# Patient Record
Sex: Male | Born: 1990 | Race: Black or African American | Hispanic: No | Marital: Single | Smoking: Current some day smoker
Health system: Southern US, Community
[De-identification: ages and names within clinical notes are randomized; demographics above are authoritative.]

## PROBLEM LIST (undated history)

## (undated) DIAGNOSIS — F419 Anxiety disorder, unspecified: Secondary | ICD-10-CM

## (undated) DIAGNOSIS — I517 Cardiomegaly: Secondary | ICD-10-CM

## (undated) DIAGNOSIS — I639 Cerebral infarction, unspecified: Secondary | ICD-10-CM

## (undated) DIAGNOSIS — I480 Paroxysmal atrial fibrillation: Secondary | ICD-10-CM

## (undated) DIAGNOSIS — I7774 Dissection of vertebral artery: Secondary | ICD-10-CM

## (undated) HISTORY — DX: Dissection of vertebral artery: I77.74

## (undated) HISTORY — PX: ADENOIDECTOMY: SUR15

## (undated) HISTORY — DX: Cerebral infarction, unspecified: I63.9

## (undated) HISTORY — DX: Paroxysmal atrial fibrillation: I48.0

## (undated) HISTORY — DX: Cardiomegaly: I51.7

---
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
## 2011-01-14 ENCOUNTER — Encounter: Payer: Self-pay | Admitting: *Deleted

## 2011-01-14 DIAGNOSIS — J40 Bronchitis, not specified as acute or chronic: Secondary | ICD-10-CM | POA: Insufficient documentation

## 2011-01-14 NOTE — ED Notes (Signed)
Patient c/o left chest pain, denies cough, pain occurred while at work-lefts boxes

## 2011-01-15 ENCOUNTER — Emergency Department (HOSPITAL_COMMUNITY)
Admission: EM | Admit: 2011-01-15 | Discharge: 2011-01-15 | Disposition: A | Discharge status: Home / Self Care | Payer: Self-pay | Attending: Emergency Medicine | Admitting: Emergency Medicine

## 2011-01-15 ENCOUNTER — Emergency Department (HOSPITAL_COMMUNITY): Payer: Self-pay

## 2011-01-15 DIAGNOSIS — J40 Bronchitis, not specified as acute or chronic: Secondary | ICD-10-CM

## 2011-01-15 HISTORY — DX: Anxiety disorder, unspecified: F41.9

## 2011-01-15 MED ORDER — ALBUTEROL SULFATE HFA 108 (90 BASE) MCG/ACT IN AERS
2.0000 | INHALATION_SPRAY | Freq: Once | RESPIRATORY_TRACT | Status: AC
  Administered 2011-01-15: 2 via RESPIRATORY_TRACT
  Filled 2011-01-15: qty 6.7

## 2011-01-15 NOTE — ED Provider Notes (Signed)
History     Chief Complaint  Patient presents with  . Chest Pain   Patient is a 20 y.o. male presenting with chest pain. The history is provided by the patient.  Chest Pain The chest pain began 6 - 12 hours ago. Chest pain occurs constantly. The chest pain is unchanged. Associated with: no provocation. The severity of the pain is mild. The quality of the pain is described as tightness. The pain does not radiate. Exacerbated by: nothing. Pertinent negatives for primary symptoms include no fever, no syncope, no shortness of breath, no cough, no wheezing, no nausea and no vomiting.  Pertinent negatives for associated symptoms include no claudication, no diaphoresis, no near-syncope, no numbness and no weakness. He tried nothing for the symptoms. Risk factors include no known risk factors.  His past medical history is significant for anxiety/panic attacks.     Past Medical History  Diagnosis Date  . Anxiety     History reviewed. No pertinent past surgical history.  History reviewed. No pertinent family history.  History  Substance Use Topics  . Smoking status: Current Everyday Smoker -- 0.5 packs/day    Types: Cigarettes  . Smokeless tobacco: Not on file  . Alcohol Use: No      Review of Systems  Constitutional: Negative for fever and diaphoresis.  Respiratory: Negative for cough, shortness of breath and wheezing.   Cardiovascular: Positive for chest pain. Negative for claudication, syncope and near-syncope.  Gastrointestinal: Negative for nausea and vomiting.  Neurological: Negative for weakness and numbness.  All other systems reviewed and are negative.    Physical Exam  BP 144/79  Pulse 94  Temp(Src) 99.2 F (37.3 C) (Oral)  Resp 18  Ht 6' (1.829 m)  Wt 184 lb (83.462 kg)  BMI 24.95 kg/m2  SpO2 100%  Physical Exam  Constitutional: He is oriented to person, place, and time. He appears well-developed and well-nourished.  HENT:  Head: Normocephalic.  Right Ear:  External ear normal.  Left Ear: External ear normal.  Eyes: Pupils are equal, round, and reactive to light.  Neck: Normal range of motion.  Cardiovascular: Normal rate.   Pulmonary/Chest: No respiratory distress. He has no wheezes. He has no rales. He exhibits no tenderness.  Abdominal: He exhibits no distension. There is no tenderness.  Musculoskeletal: He exhibits no edema and no tenderness.  Neurological: He is alert and oriented to person, place, and time. He has normal reflexes.  Skin: Skin is warm and dry.  Psychiatric: He has a normal mood and affect. His behavior is normal. Judgment and thought content normal.    ED Course  Procedures Reevaluation with update and discussion. After initial assessment and treatment, an updated evaluation at this time reveals; pt has decreased chest discomfort after the inhaler. I doubt any other EMC precluding discharge at this time including, but not necessarily limited to the following: PNE,PE, PTX, serious bacterial infection MDM As above      Flint Melter, MD 01/15/11 [phone]

## 2011-01-15 NOTE — ED Notes (Signed)
Pt gone over to xray  

## 2011-01-16 ENCOUNTER — Emergency Department (HOSPITAL_COMMUNITY)
Admission: EM | Admit: 2011-01-16 | Discharge: 2011-01-16 | Disposition: A | Discharge status: Home / Self Care | Payer: Self-pay | Attending: Emergency Medicine | Admitting: Emergency Medicine

## 2011-01-16 ENCOUNTER — Other Ambulatory Visit: Payer: Self-pay

## 2011-01-16 ENCOUNTER — Emergency Department (HOSPITAL_COMMUNITY): Payer: Self-pay

## 2011-01-16 ENCOUNTER — Encounter (HOSPITAL_COMMUNITY): Payer: Self-pay | Admitting: *Deleted

## 2011-01-16 DIAGNOSIS — F17200 Nicotine dependence, unspecified, uncomplicated: Secondary | ICD-10-CM | POA: Insufficient documentation

## 2011-01-16 DIAGNOSIS — R071 Chest pain on breathing: Secondary | ICD-10-CM | POA: Insufficient documentation

## 2011-01-16 MED ORDER — IBUPROFEN 800 MG PO TABS: 800.0000 mg | ORAL_TABLET | Freq: Three times a day (TID) | ORAL | Status: AC

## 2011-01-16 MED ORDER — IBUPROFEN 800 MG PO TABS
800.0000 mg | ORAL_TABLET | Freq: Once | ORAL | Status: AC
  Administered 2011-01-16: 800 mg via ORAL
  Filled 2011-01-16: qty 1

## 2011-01-16 MED ORDER — LORAZEPAM 1 MG PO TABS
1.0000 mg | ORAL_TABLET | Freq: Once | ORAL | Status: AC
  Administered 2011-01-16: 1 mg via ORAL
  Filled 2011-01-16: qty 1

## 2011-01-16 NOTE — ED Notes (Signed)
Pt calm, cooperative, A&O, NAD; verbalizes understanding of POC and f/u plan

## 2011-01-16 NOTE — ED Notes (Signed)
Pt c/o left chest pain and pressure with shortness of breath. States that he was seen in ED 2 days ago but the pain is worse now. Denies cough and congestion.

## 2011-01-16 NOTE — ED Notes (Signed)
Pt states dx with bronchitis when here for CP two days ago. Pt states he was getting ready to start work and began having pain to left chest. Pain described as pressure. EKG obtained. NSR on monitor. NAD. Pain is non radiating.

## 2011-01-16 NOTE — ED Provider Notes (Signed)
History     Chief Complaint  Patient presents with  . Chest Pain   HPI Comments: Pt reports episode of chest pressure 2 days ago, seen in ED at that time. Another episode occurred today while working in the heat pta, +associated mild sob. Pt states sob has resolved but still c/o chest pressure and feeling anxious. No recent injury. Reports pain is located in the left chest, non-radiating, and worse with certain positions and heat exposure.  Patient is a 20 y.o. male presenting with chest pain. The history is provided by the patient.  Chest Pain The chest pain began 2 days ago. Chest pain occurs intermittently. Progression since onset: Episode of chest pressure 2 days ago, seen in ED at that time; pt at work today in the heat and c/o another episode of same pressure with sob onset just pta. The pain is associated with exertion (heat). The quality of the pain is described as pressure-like. The pain does not radiate. Chest pain is worsened by certain positions (being in the heat). Primary symptoms include shortness of breath. Pertinent negatives for primary symptoms include no fever, no cough, no palpitations, no abdominal pain, no nausea and no dizziness.  Pertinent negatives for associated symptoms include no lower extremity edema, no numbness and no weakness. Associated symptoms comments: Feeling anxious.  His past medical history is significant for anxiety/panic attacks.     Past Medical History  Diagnosis Date  . Anxiety     Past Surgical History  Procedure Date  . Adenoidectomy     History reviewed. No pertinent family history.  History  Substance Use Topics  . Smoking status: Current Everyday Smoker -- 0.5 packs/day    Types: Cigarettes  . Smokeless tobacco: Not on file  . Alcohol Use: No      Review of Systems  Constitutional: Negative for fever and chills.  HENT: Negative for neck pain and neck stiffness.   Eyes: Negative for pain.  Respiratory: Positive for shortness  of breath. Negative for cough.   Cardiovascular: Positive for chest pain. Negative for palpitations and leg swelling.  Gastrointestinal: Negative for nausea and abdominal pain.  Genitourinary: Negative for dysuria.  Musculoskeletal: Negative for back pain.  Skin: Negative for rash.  Neurological: Negative for dizziness, weakness, numbness and headaches.  Psychiatric/Behavioral: The patient is nervous/anxious.   All other systems reviewed and are negative.    Physical Exam  BP 112/71  Pulse 94  Temp(Src) 99.1 F (37.3 C) (Oral)  Resp 20  Ht 6' (1.829 m)  Wt 184 lb (83.462 kg)  BMI 24.95 kg/m2  SpO2 99%  Physical Exam  Constitutional: He is oriented to person, place, and time. He appears well-developed and well-nourished.  HENT:  Head: Normocephalic and atraumatic.  Eyes: Conjunctivae and EOM are normal. Pupils are equal, round, and reactive to light.  Neck: Trachea normal. Neck supple. No thyromegaly present.  Cardiovascular: Normal rate, regular rhythm, S1 normal, S2 normal and normal pulses.     No systolic murmur is present   No diastolic murmur is present  Pulses:      Radial pulses are 2+ on the right side, and 2+ on the left side.  Pulmonary/Chest: Effort normal and breath sounds normal. He has no wheezes. He has no rhonchi. He has no rales. He exhibits tenderness.       Reproducible left chest wall tenderness  Abdominal: Soft. Normal appearance and bowel sounds are normal. There is no tenderness. There is no CVA tenderness and negative  Murphy's sign.  Musculoskeletal: He exhibits no edema and no tenderness.       BLE:s Calves nontender, no cords or erythema, negative Homans sign  Neurological: He is alert and oriented to person, place, and time. He has normal strength. No cranial nerve deficit or sensory deficit. GCS eye subscore is 4. GCS verbal subscore is 5. GCS motor subscore is 6.  Skin: Skin is warm and dry. No rash noted. He is not diaphoretic.  Psychiatric: His  speech is normal.       Cooperative and appropriate    ED Course  Procedures   Date: 01/16/2011  Rate: 93  Rhythm: normal sinus rhythm  QRS Axis: normal  Intervals: normal  ST/T Wave abnormalities: nonspecific ST changes  Conduction Disutrbances:none  Narrative Interpretation:   Old EKG Reviewed: unchanged  Dg Chest 1 View  01/15/2011  *RADIOLOGY REPORT*  Clinical Data: 20 year old male with chest tightness, pain.  CHEST - 1 VIEW  Comparison: 0230 hours the same day and earlier.  Findings: A single lateral view the chest is provided for evaluation of question small pleural effusions seen on the previous upright portable.  The costophrenic sulci appear clear.  Cardiac and mediastinal contours appear stable compared to the 2010 exam. No acute osseous abnormality identified.  No acute pulmonary opacity.  IMPRESSION: No evidence of pleural effusions. No acute cardiopulmonary abnormality.  Original Report Authenticated By: Harley Hallmark, M.D.   Dg Chest Portable 1 View  01/15/2011  *RADIOLOGY REPORT*  Clinical Data: 20 year old male with chest pain.  PORTABLE CHEST - 1 VIEW  Comparison: 05/01/2009 and earlier.  Findings: Portable upright AP view 0230 hours.  Stable mild cardiomegaly. Other mediastinal contours are within normal limits. Visualized tracheal air column is within normal limits.  Suggestion of small bilateral pleural effusions.  Otherwise, the lungs are clear. No acute osseous abnormality identified.  IMPRESSION: Suggestion of small bilateral pleural effusions, could be confirmed with a lateral view.  Stable mild cardiomegaly.  Original Report Authenticated By: Harley Hallmark, M.D.   Dg Chest 2 View  01/16/2011  *RADIOLOGY REPORT*  Clinical Data: Chest pain  CHEST - 2 VIEW  Comparison: 01/15/11  Findings: The heart size and mediastinal contours are within normal limits.  Both lungs are clear.  The visualized skeletal structures are unremarkable.  IMPRESSION:  No active cardiopulmonary  abnormalities.  Original Report Authenticated By: Rosealee Albee, M.D.    Pt recheck 1850: Pt resting comfortably, pain improved. Discussed workup findings and plan for discharge, pt understands and agrees with plan.   MDM  PT with reproducible chest wall tenderness and h/o anxiety, screening ECG reviewed and CXR obtained.  Treated PO NSAIDs and ativan for anxiety. No PTX and no symptoms of DVT, doubt PE given exam.   I personally performed the services described in this documentation, which was scribed in my presence. The recorded information has been reviewed and considered.  Written by Enos Fling acting as scribe for Dr. Dierdre Highman.       Sunnie Nielsen, MD 01/17/11 [phone]

## 2011-09-03 ENCOUNTER — Emergency Department (INDEPENDENT_AMBULATORY_CARE_PROVIDER_SITE_OTHER)
Admission: EM | Admit: 2011-09-03 | Discharge: 2011-09-03 | Disposition: A | Discharge status: Home / Self Care | Payer: BC Managed Care – PPO | Source: Home / Self Care | Attending: Family Medicine | Admitting: Family Medicine

## 2011-09-03 ENCOUNTER — Encounter (HOSPITAL_COMMUNITY): Payer: Self-pay | Admitting: Emergency Medicine

## 2011-09-03 DIAGNOSIS — J029 Acute pharyngitis, unspecified: Secondary | ICD-10-CM

## 2011-09-03 LAB — POCT RAPID STREP A: Streptococcus, Group A Screen (Direct): NEGATIVE

## 2011-09-03 NOTE — ED Notes (Signed)
Pt. Stated, I feel like my tonsils are swollen, they don't hurt, and I'm not sick.

## 2011-09-03 NOTE — ED Notes (Signed)
Pt. Walked out of the room, and his girlfriend stated, I don't know what's wrong with him, I'm trying to get him back,  Pt. Never returned.

## 2011-09-03 NOTE — ED Notes (Signed)
Pt walked out of exam room toward waiting room and didn't say anything to staff in hallway.  A few minutes later friend that was in the room walked out of room too.  We asked her if he was leaving and she said "I don't know, I am going to see what he is doing".  Friend apologized.  Went out to waiting room and neither pt nor friend are seen in waiting room.

## 2011-09-15 ENCOUNTER — Emergency Department (HOSPITAL_COMMUNITY)
Admission: EM | Admit: 2011-09-15 | Discharge: 2011-09-15 | Discharge status: Against Medical Advice | Payer: BC Managed Care – PPO | Attending: Emergency Medicine | Admitting: Emergency Medicine

## 2011-09-15 ENCOUNTER — Encounter (HOSPITAL_COMMUNITY): Payer: Self-pay | Admitting: *Deleted

## 2011-09-15 DIAGNOSIS — Z0389 Encounter for observation for other suspected diseases and conditions ruled out: Secondary | ICD-10-CM | POA: Insufficient documentation

## 2011-09-15 NOTE — ED Notes (Signed)
Call to waiting area with no reply 

## 2011-09-15 NOTE — ED Notes (Signed)
Pt reports having increased anxiety over the past couple of weeks. It wakes him from his sleep. Pt denies SI or HI.

## 2011-09-15 NOTE — ED Notes (Signed)
3 rd call to waiting area with no reply suspected elopement

## 2011-09-16 ENCOUNTER — Encounter (HOSPITAL_COMMUNITY): Payer: Self-pay

## 2011-09-16 DIAGNOSIS — F411 Generalized anxiety disorder: Secondary | ICD-10-CM | POA: Insufficient documentation

## 2011-09-17 ENCOUNTER — Emergency Department (HOSPITAL_COMMUNITY)
Admission: EM | Admit: 2011-09-17 | Discharge: 2011-09-17 | Disposition: A | Discharge status: Home / Self Care | Payer: BC Managed Care – PPO | Attending: Emergency Medicine | Admitting: Emergency Medicine

## 2011-09-17 DIAGNOSIS — F419 Anxiety disorder, unspecified: Secondary | ICD-10-CM

## 2011-09-17 MED ORDER — ALPRAZOLAM 0.5 MG PO TABS
0.2500 mg | ORAL_TABLET | Freq: Once | ORAL | Status: AC
  Administered 2011-09-17: 0.25 mg via ORAL
  Filled 2011-09-17: qty 1

## 2011-09-17 NOTE — ED Notes (Signed)
Pt alert & oriented x4, stable gait. Pt given discharge instructions, paperwork. Patient instructed to stop at the registration desk to finish any additional paperwork. pt verbalized understanding. Pt left department w/ no further questions.  

## 2011-09-17 NOTE — ED Provider Notes (Signed)
History     CSN: [id number]  Arrival date & time 09/16/11  2322   First MD Initiated Contact with Patient 09/17/11 0136      Chief Complaint  Patient presents with  . Anxiety    (Consider location/radiation/quality/duration/timing/severity/associated sxs/prior treatment) HPI David Shields is a 21 y.o. male who presents to the Emergency Department complaining of anxiety. He states his has anxiety which has gotten worse over the past two weeks. He is to see a new PCP on Thursday. Formerly seen by Dr. Janna Arch who would not Rx anti anxiety medicines. Patient states his anxiety manifests as rapid heart rate, rapid breathing, dizziness, perioral numbness and tingling. He denies SI, HI, hallucinations. Past Medical History  Diagnosis Date  . Anxiety     Past Surgical History  Procedure Date  . Adenoidectomy     No family history on file.  History  Substance Use Topics  . Smoking status: Current Everyday Smoker -- 0.5 packs/day    Types: Cigarettes  . Smokeless tobacco: Not on file  . Alcohol Use: No      Review of Systems  Constitutional: Negative for fever.       10 Systems reviewed and are negative for acute change except as noted in the HPI.  HENT: Negative for congestion.   Eyes: Negative for discharge and redness.  Respiratory: Negative for cough and shortness of breath.   Cardiovascular: Negative for chest pain.  Gastrointestinal: Negative for vomiting and abdominal pain.  Musculoskeletal: Negative for back pain.  Skin: Negative for rash.  Neurological: Negative for syncope, numbness and headaches.  Psychiatric/Behavioral:       Anxious    Allergies  Review of patient's allergies indicates no known allergies.  Home Medications   Current Outpatient Rx  Name Route Sig Dispense Refill  . VENTOLIN IN Inhalation Inhale 2 puffs into the lungs 2 (two) times daily.        BP 124/79  Pulse 100  Temp(Src) 98.3 F (36.8 C) (Oral)  Resp 20  Ht 6' (1.829 m)   Wt 157 lb (71.215 kg)  BMI 21.29 kg/m2  SpO2 100%  Physical Exam  Nursing note and vitals reviewed. Constitutional: He is oriented to person, place, and time.       Awake, alert, nontoxic appearance.  HENT:  Head: Atraumatic.  Eyes: Right eye exhibits no discharge. Left eye exhibits no discharge.  Neck: Neck supple.  Pulmonary/Chest: Effort normal. He exhibits no tenderness.  Abdominal: Soft. There is no tenderness. There is no rebound.  Musculoskeletal: He exhibits no tenderness.       Baseline ROM, no obvious new focal weakness.  Neurological: He is alert and oriented to person, place, and time.        motor strength appears baseline for patient and situation.  Skin: No rash noted.  Psychiatric:       anxious    ED Course  Procedures (including critical care time 1. Anxiety       MDM  Patient with anxiety here with anxiety. Given anxiolytic. Encouraged to keep appointment with new PCP on Thursday. Given information re [facility]. Patient is not suicidal, homicidal or psychotic.Pt stable in ED with no significant deterioration in condition.The patient appears reasonably screened and/or stabilized for discharge and I doubt any other medical condition or other [facility] requiring further screening, evaluation, or treatment in the ED at this time prior to discharge.  MDM Reviewed: nursing note and vitals  Nicoletta Dress. Colon Branch, MD 09/17/11 1014

## 2011-09-17 NOTE — ED Notes (Signed)
Pt to pcp provider on Thursday. [facility] was seen [facility] ago & left AMA. Pt denies taking any meds for anxiety.

## 2011-09-17 NOTE — Discharge Instructions (Signed)
Keep your appointment with your new doctor on Thursday. Talked with him about your anxiety. He may decide to start you on medication.    Anxiety and Panic Attacks Your caregiver has informed you that you are having an anxiety or panic attack. There may be many forms of this. Most of the time these attacks come suddenly and without warning. They come at any time of day, including periods of sleep, and at any time of life. They may be strong and unexplained. Although panic attacks are very scary, they are physically harmless. Sometimes the cause of your anxiety is not known. Anxiety is a protective mechanism of the body in its fight or flight mechanism. Most of these perceived danger situations are actually nonphysical situations (such as anxiety over losing a job). CAUSES  The causes of an anxiety or panic attack are many. Panic attacks may occur in otherwise healthy people given a certain set of circumstances. There may be a genetic cause for panic attacks. Some medications may also have anxiety as a side effect. SYMPTOMS  Some of the most common feelings are:  Intense terror.   Dizziness, feeling faint.   Hot and cold flashes.   Fear of going crazy.   Feelings that nothing is real.   Sweating.   Shaking.   Chest pain or a fast heartbeat (palpitations).   Smothering, choking sensations.   Feelings of impending doom and that death is near.   Tingling of extremities, this may be from over-breathing.   Altered reality (derealization).   Being detached from yourself (depersonalization).  Several symptoms can be present to make up anxiety or panic attacks. DIAGNOSIS  The evaluation by your caregiver will depend on the type of symptoms you are experiencing. The diagnosis of anxiety or panic attack is made when no physical illness can be determined to be a cause of the symptoms. TREATMENT  Treatment to prevent anxiety and panic attacks may include:  Avoidance of circumstances that  cause anxiety.   Reassurance and relaxation.   Regular exercise.   Relaxation therapies, such as yoga.   Psychotherapy with a psychiatrist or therapist.   Avoidance of caffeine, alcohol and illegal drugs.   Prescribed medication.  SEEK IMMEDIATE MEDICAL CARE IF:   You experience panic attack symptoms that are different than your usual symptoms.   You have any worsening or concerning symptoms.  Document Released: 06/17/2005 Document Revised: 06/06/2011 Document Reviewed: 10/19/2009 [facility] Patient Information 2012 [place], [place].

## 2011-09-19 ENCOUNTER — Encounter: Payer: Self-pay | Admitting: Family Medicine

## 2011-09-19 ENCOUNTER — Ambulatory Visit (INDEPENDENT_AMBULATORY_CARE_PROVIDER_SITE_OTHER): Payer: BC Managed Care – PPO | Admitting: Family Medicine

## 2011-09-19 VITALS — BP 104/78 | HR 74 | Resp 16 | Ht 72.25 in | Wt 154.4 lb

## 2011-09-19 DIAGNOSIS — F410 Panic disorder [episodic paroxysmal anxiety] without agoraphobia: Secondary | ICD-10-CM

## 2011-09-19 DIAGNOSIS — F418 Other specified anxiety disorders: Secondary | ICD-10-CM | POA: Insufficient documentation

## 2011-09-19 DIAGNOSIS — F17200 Nicotine dependence, unspecified, uncomplicated: Secondary | ICD-10-CM

## 2011-09-19 DIAGNOSIS — Z720 Tobacco use: Secondary | ICD-10-CM

## 2011-09-19 DIAGNOSIS — F411 Generalized anxiety disorder: Secondary | ICD-10-CM

## 2011-09-19 DIAGNOSIS — F419 Anxiety disorder, unspecified: Secondary | ICD-10-CM

## 2011-09-19 MED ORDER — ALPRAZOLAM 0.25 MG PO TABS: 0.2500 mg | ORAL_TABLET | Freq: Two times a day (BID) | ORAL | Status: DC | PRN

## 2011-09-19 MED ORDER — FLUOXETINE HCL 20 MG PO CAPS: 20.0000 mg | ORAL_CAPSULE | Freq: Every day | ORAL | Status: DC
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
## 2011-09-19 NOTE — Assessment & Plan Note (Signed)
Prn xanax use, given low dose, discussed habituation and addiction. Proper use of medication

## 2011-09-19 NOTE — Assessment & Plan Note (Signed)
Counseled on cessation 

## 2011-09-19 NOTE — Patient Instructions (Signed)
I will get your records from your previous physicians Start the prozac daily Use the xanax as needed for panic attack F/U in 3 weeks

## 2011-09-19 NOTE — Assessment & Plan Note (Signed)
I think he is dealing with anxiety and possibly some underlying depression that stemmed from loosing his college dreams and basketball. I advised him to look into community college, he is very young ands still has a chance to go back to school. He will be started on SSRI, he was looking for help and medications because his attacks are affecting his daily life. 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

## 2011-10-11 ENCOUNTER — Ambulatory Visit (INDEPENDENT_AMBULATORY_CARE_PROVIDER_SITE_OTHER): Payer: BC Managed Care – PPO | Admitting: Family Medicine

## 2011-10-11 ENCOUNTER — Encounter: Payer: Self-pay | Admitting: Family Medicine

## 2011-10-11 VITALS — BP 126/74 | HR 84 | Resp 18 | Ht 72.25 in | Wt 146.0 lb

## 2011-10-11 DIAGNOSIS — M79643 Pain in unspecified hand: Secondary | ICD-10-CM

## 2011-10-11 DIAGNOSIS — F410 Panic disorder [episodic paroxysmal anxiety] without agoraphobia: Secondary | ICD-10-CM

## 2011-10-11 DIAGNOSIS — F411 Generalized anxiety disorder: Secondary | ICD-10-CM

## 2011-10-11 DIAGNOSIS — F419 Anxiety disorder, unspecified: Secondary | ICD-10-CM

## 2011-10-11 DIAGNOSIS — Z720 Tobacco use: Secondary | ICD-10-CM

## 2011-10-11 DIAGNOSIS — F17200 Nicotine dependence, unspecified, uncomplicated: Secondary | ICD-10-CM

## 2011-10-11 DIAGNOSIS — M79609 Pain in unspecified limb: Secondary | ICD-10-CM

## 2011-10-11 MED ORDER — ALPRAZOLAM 0.25 MG PO TABS: 0.2500 mg | ORAL_TABLET | Freq: Two times a day (BID) | ORAL | Status: DC | PRN

## 2011-10-11 MED ORDER — NICOTINE 14 MG/24HR TD PT24: 1.0000 | MEDICATED_PATCH | TRANSDERMAL | Status: AC

## 2011-10-11 MED ORDER — NICOTINE 7 MG/24HR TD PT24: 1.0000 | MEDICATED_PATCH | TRANSDERMAL | Status: AC

## 2011-10-11 MED ORDER — FLUOXETINE HCL 20 MG PO CAPS: 20.0000 mg | ORAL_CAPSULE | Freq: Every day | ORAL | Status: DC

## 2011-10-11 NOTE — Assessment & Plan Note (Signed)
He would like to quit smoking. I would avoid Chantix in him because of his anxiety and panic attacks. Also at this time he is on Prozac therefore we'll avoid adding Wellbutrin at this time. He will try nicotine patches

## 2011-10-11 NOTE — Assessment & Plan Note (Signed)
He had an old basketball injury and has reconsidered this recently. He does have a bony deformity noted will obtain x-ray. He may need to be sent to hand surgeon for evaluation
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
## 2011-10-11 NOTE — Patient Instructions (Signed)
Continue your current medications Try the nicotine patches - do not smoke with the patches - start the 14mg  patch first for 4 weeks then move to the 7mg  patch F/U 6 weeks

## 2011-10-11 NOTE — Assessment & Plan Note (Signed)
Improved we'll continue Prozac

## 2011-10-11 NOTE — Assessment & Plan Note (Signed)
This has improved will continue current medications. I will start to get him off of the benzodiazepine or to use at least very limited

## 2011-12-21 ENCOUNTER — Other Ambulatory Visit: Payer: Self-pay | Admitting: Family Medicine

## 2012-01-16 ENCOUNTER — Ambulatory Visit: Payer: BC Managed Care – PPO | Admitting: Family Medicine

## 2012-01-22 ENCOUNTER — Encounter: Payer: Self-pay | Admitting: Family Medicine

## 2012-01-22 ENCOUNTER — Ambulatory Visit (INDEPENDENT_AMBULATORY_CARE_PROVIDER_SITE_OTHER): Payer: BC Managed Care – PPO | Admitting: Family Medicine

## 2012-01-22 VITALS — BP 110/72 | HR 64 | Resp 16 | Ht 72.25 in | Wt 156.1 lb

## 2012-01-22 DIAGNOSIS — F410 Panic disorder [episodic paroxysmal anxiety] without agoraphobia: Secondary | ICD-10-CM

## 2012-01-22 DIAGNOSIS — F419 Anxiety disorder, unspecified: Secondary | ICD-10-CM

## 2012-01-22 DIAGNOSIS — F411 Generalized anxiety disorder: Secondary | ICD-10-CM

## 2012-01-22 NOTE — Assessment & Plan Note (Signed)
Currently stable, no meds per above

## 2012-01-22 NOTE — Patient Instructions (Signed)
I will clear you to sign up for [employer] Letter will be faxed to [facility]  I will also mail a copy of letter to your Home Use Eucerin Cream and Hydrocortisone 1%  F/U 3 months

## 2012-01-22 NOTE — Progress Notes (Signed)
  Subjective:    Patient ID: David Shields, male    DOB: 1990/08/02, 21 y.o.   MRN: [id number]  HPI Patient presents to followup anxiety. He's been lost to followup for the past few visits. He tells me today he plans to enter the [employer] and he has taken himself off his medications. He was previously on fluoxetine and Xanax for panic attacks. He denies any symptoms and has been off of this medication for 2 weeks. He states that he is doing well he has not had any panic attacks and does not feel anxious. He's had significant life changes over the past few months he was in an apartment with his friends in [facility] [place] however he lost his job and his car was reprocessed therefore he had to move back in with his mother. He is no longer working at this time. He is still with his significant other and they plan to marry next year. He states he is sleeping well and has no concerns. He needs a note stating that he is off of medications in stable to proceed with the Musician.    Review of Systems  GEN- denies fatigue, fever, weight loss,weakness, recent illness HEENT- denies eye drainage, change in vision, nasal discharge, CVS- denies chest pain, palpitations RESP- denies SOB, cough, wheeze ABD- denies N/V, change in stools, abd pain GU- denies dysuria, hematuria, dribbling, incontinence MSK- denies joint pain, muscle aches, injury Neuro- denies headache, dizziness, syncope, seizure activity      Objective:   Physical Exam GEN- NAD, alert and oriented x3 HEENT- PERRL, EOMI, non injected sclera, pink conjunctiva, MMM, oropharynx clear Neck- Supple, no thryomegaly CVS- RRR, no murmur RESP-CTAB ABD-NABS,soft,NT,ND EXT- No edema Pulses- Radial, DP- 2+ Psych- normal affect and Mood, not overly anxious appearing         Assessment & Plan:  Approximately 15 minutes spent with patient > 50 percent of time spent counseling on anxiety/panic attacks and coping

## 2012-01-22 NOTE — Assessment & Plan Note (Addendum)
Patient has anxiety as well as panic attacks however he states he is doing well off of his medications. It is difficult to tell because he wants to into the army whether or not he is having trouble and he is hiding his symptoms or if he truly is coping as he has no other responsibilities at this time is not working and he has no transportation. He does not have any residual side effects from coming off of his medications abruptly. 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 He was advised he would need to find other ways to cope with his anxiety and panic attacks in the military or he will be likely put on leave her sent home. I recommended him to continue riding his RAP lyrics  as this tends to keep him calm and stay in touch with his family

## 2012-08-11 ENCOUNTER — Ambulatory Visit: Payer: BC Managed Care – PPO | Admitting: Family Medicine

## 2012-08-14 ENCOUNTER — Ambulatory Visit: Payer: BC Managed Care – PPO | Admitting: Family Medicine

## 2012-08-20 ENCOUNTER — Ambulatory Visit: Payer: BC Managed Care – PPO | Admitting: Family Medicine

## 2012-08-21 ENCOUNTER — Encounter: Payer: Self-pay | Admitting: Family Medicine

## 2012-08-21 ENCOUNTER — Ambulatory Visit (INDEPENDENT_AMBULATORY_CARE_PROVIDER_SITE_OTHER): Payer: BC Managed Care – PPO | Admitting: Family Medicine

## 2012-08-21 VITALS — BP 112/74 | HR 81 | Resp 16 | Wt 156.0 lb

## 2012-08-21 DIAGNOSIS — Z720 Tobacco use: Secondary | ICD-10-CM

## 2012-08-21 DIAGNOSIS — F419 Anxiety disorder, unspecified: Secondary | ICD-10-CM

## 2012-08-21 DIAGNOSIS — F410 Panic disorder [episodic paroxysmal anxiety] without agoraphobia: Secondary | ICD-10-CM

## 2012-08-21 DIAGNOSIS — F411 Generalized anxiety disorder: Secondary | ICD-10-CM

## 2012-08-21 DIAGNOSIS — F17200 Nicotine dependence, unspecified, uncomplicated: Secondary | ICD-10-CM

## 2012-08-21 MED ORDER — ALPRAZOLAM 0.25 MG PO TABS: 0.2500 mg | ORAL_TABLET | Freq: Two times a day (BID) | ORAL | Status: DC | PRN

## 2012-08-21 MED ORDER — FLUOXETINE HCL 20 MG PO CAPS: 20.0000 mg | ORAL_CAPSULE | Freq: Every day | ORAL | Status: DC

## 2012-08-21 NOTE — Assessment & Plan Note (Signed)
He was unable to afford the nicotine patches he will see if he can get these over-the-counter or try the Nicorette gum

## 2012-08-21 NOTE — Assessment & Plan Note (Signed)
Per above I will give him low-dose Xanax ease the time of panic attacks he will be started on the fluoxetine with plans to taper him off of the Xanax

## 2012-08-21 NOTE — Patient Instructions (Signed)
Start prozac tonight Xanax used for severe panic attack until prozac starts working  Nico-derm patch for smoking  F/U 4 weeks

## 2012-08-21 NOTE — Progress Notes (Signed)
  Subjective:    Patient ID: David Shields, male    DOB: December 27, 1990, 22 y.o.   MRN: [id number]  HPI    Patient here to followup anxiety he's not been seen since July 2013 at that time he told me that he was entering the [employer] and he had stopped his medications. He was unable to answer the military and I'm not quite sure for something about the paperwork never going for her. He's here with his girlfriend today he is currently [redacted] weeks pregnant and having severe anxiety over the past couple months. He describes 3 spells he's had this month where he had severe panic attacks where he felt like he couldn't breathe any 7 heart attack and he broke out in a sweat he typically tries to smoke cigarette when this happens and this helps calm him some but is been worsening. She is back to having the attacks in the [place] night where he wakes up and he starts walking neck veins and feeling like he can't breathe. 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   Review of Systems   GEN- denies fatigue, fever, weight loss,weakness, recent illness HEENT- denies eye drainage, change in vision, nasal discharge, CVS- denies chest pain, palpitations RESP- denies SOB, cough, wheeze ABD- denies N/V, change in stools, abd pain GU- denies dysuria, hematuria, dribbling, incontinence MSK- denies joint pain, muscle aches, injury Neuro- denies headache, dizziness, syncope, seizure activity      Objective:   Physical Exam GEN- NAD, alert and oriented x3 HEENT- PERRL, EOMI, non injected sclera, pink conjunctiva, MMM, oropharynx clear Neck- Supple,  CVS- RRR, no murmur RESP-CTAB EXT- No edema Pulses- Radial, DP- 2+ Psych- anxious appearing,not depressed, good eye contact, normal though process, no hallucinations  GAD-7-- Score 21     Assessment & Plan:

## 2012-08-21 NOTE — Assessment & Plan Note (Signed)
Generalized anxiety disorder which is uncontrolled he is doing very well on Prozac with overlap with Xanax I will restart him on Prozac 20 mg and this seems he has a followup to continue getting  his medications. Discussed especially at his young age he cannot use any alcoholic beverages or any illegal drugs with the use of this medication

## 2012-09-21 ENCOUNTER — Ambulatory Visit: Payer: BC Managed Care – PPO | Admitting: Family Medicine

## 2012-10-08 ENCOUNTER — Ambulatory Visit: Payer: BC Managed Care – PPO | Admitting: Family Medicine

## 2012-10-13 ENCOUNTER — Ambulatory Visit: Payer: BC Managed Care – PPO | Admitting: Family Medicine

## 2012-11-19 ENCOUNTER — Other Ambulatory Visit: Payer: Self-pay | Admitting: Family Medicine

## 2013-02-01 ENCOUNTER — Encounter: Payer: Self-pay | Admitting: Family Medicine

## 2013-02-01 ENCOUNTER — Ambulatory Visit (INDEPENDENT_AMBULATORY_CARE_PROVIDER_SITE_OTHER): Payer: BC Managed Care – PPO | Admitting: Family Medicine

## 2013-02-01 VITALS — BP 96/68 | HR 60 | Temp 97.4°F | Resp 14 | Ht 73.0 in | Wt 155.0 lb

## 2013-02-01 DIAGNOSIS — G4700 Insomnia, unspecified: Secondary | ICD-10-CM

## 2013-02-01 DIAGNOSIS — F411 Generalized anxiety disorder: Secondary | ICD-10-CM

## 2013-02-01 DIAGNOSIS — F419 Anxiety disorder, unspecified: Secondary | ICD-10-CM

## 2013-02-01 DIAGNOSIS — B360 Pityriasis versicolor: Secondary | ICD-10-CM

## 2013-02-01 DIAGNOSIS — Z720 Tobacco use: Secondary | ICD-10-CM

## 2013-02-01 DIAGNOSIS — F17200 Nicotine dependence, unspecified, uncomplicated: Secondary | ICD-10-CM

## 2013-02-01 DIAGNOSIS — F410 Panic disorder [episodic paroxysmal anxiety] without agoraphobia: Secondary | ICD-10-CM

## 2013-02-01 MED ORDER — ALPRAZOLAM 0.25 MG PO TABS: 0.2500 mg | ORAL_TABLET | Freq: Two times a day (BID) | ORAL | Status: DC | PRN

## 2013-02-01 MED ORDER — FLUOXETINE HCL 20 MG PO TABS: 20.0000 mg | ORAL_TABLET | Freq: Every day | ORAL | Status: DC

## 2013-02-01 MED ORDER — FLUCONAZOLE 100 MG PO TABS: 100.0000 mg | ORAL_TABLET | Freq: Every day | ORAL | Status: DC

## 2013-02-01 NOTE — Patient Instructions (Signed)
F/u 4 WEEKS Restart prozac 20mg  once a day Xanax at bedtime  Diflucan and eucerin cream for your skin

## 2013-02-01 NOTE — Progress Notes (Signed)
  Subjective:    Patient ID: David Shields, male    DOB: 02/02/1991, 22 y.o.   MRN: [id number]  HPI Patient here to followup his anxiety and panic attacks. He's been lost to followup since her last visit 6 months ago. Was started on Prozac at that time states he did well but he stopped taking the medications after he did not followup. He's had his child who is now 24 months old he is separated from the child's mother and causing some strife between the twp.. There is a concern there may be a custody battle. He is now working for the past 2 weeks but gets very anxious on the job. He does not have his own transportation and has moved back in with his mother and there's a lot of strain on the relationship there. He does not sleep very well and will continue to wake up with panic attacks. He is currently on third shift so he has difficulty falling asleep during the day. He denies any illicit drug use but continues to smoke tobacco he also drinks alcohol when he goes out with his friends he may drink 3 or 4 beers at a time on the week David Shields  He's also concerned about a rash on his face which is been present on and off for the past couple years. He also gets dry skin on his back.  Review of Systems - per above  GEN- denies fatigue, fever, weight loss,weakness, recent illness HEENT- denies eye drainage, change in vision, nasal discharge, CVS- denies chest pain, palpitations RESP- denies SOB, cough, wheeze ABD- denies N/V, change in stools, abd pain GU- denies dysuria, hematuria, dribbling, incontinence MSK- denies joint pain, muscle aches, injury Neuro- denies headache, dizziness, syncope, seizure activity Skin- per above      Objective:   Physical Exam  GEN- NAD, alert and oriented x3 HEENT- PERRL, EOMI, non injected sclera, pink conjunctiva, MMM, oropharynx clear CVS- RRR, no murmur RESP-CTAB Pulses- Radial Skin- hypopigmentation on bilateral forehead and back, macular distrubution with dry  skin and flaking over the lesions Psych- anxious appearing,not depressed, fair eye contact, ,no apparent SI, no hallucinations      Assessment & Plan:

## 2013-02-01 NOTE — Assessment & Plan Note (Signed)
Uncontrolled restart SSRI. He continues to Xanax as needed and at bedtime for his sleeping

## 2013-02-01 NOTE — Assessment & Plan Note (Signed)
Uncontrolled symptoms. Will restart his SSRI we discussed his complaints of medications and I cannot help him with his symptoms if he does not follow through with his medications and his appointments. Today he states he he will followup with his health and will return in 4 weeks for recheck

## 2013-02-01 NOTE — Assessment & Plan Note (Signed)
Treat with Diflucan 100 mg daily for one week

## 2013-02-01 NOTE — Assessment & Plan Note (Signed)
Since he has a presently him he can use this at bedtime to help with his sleep

## 2013-02-01 NOTE — Assessment & Plan Note (Signed)
Counseled on need for cessation he's not ready to quit

## 2013-02-12 ENCOUNTER — Other Ambulatory Visit: Payer: Self-pay | Admitting: Family Medicine

## 2013-02-15 NOTE — Telephone Encounter (Signed)
?   OK to Refill  

## 2013-02-15 NOTE — Telephone Encounter (Signed)
Okay to refill? 

## 2013-03-03 ENCOUNTER — Ambulatory Visit: Payer: BC Managed Care – PPO | Admitting: Family Medicine

## 2013-03-15 ENCOUNTER — Encounter: Payer: Self-pay | Admitting: *Deleted

## 2013-03-16 ENCOUNTER — Telehealth: Payer: Self-pay | Admitting: Family Medicine

## 2013-03-16 NOTE — Telephone Encounter (Signed)
Sent certified letter 03/16/13 with tracking number [id number] [phone]

## 2013-08-15 ENCOUNTER — Emergency Department (INDEPENDENT_AMBULATORY_CARE_PROVIDER_SITE_OTHER)
Admission: EM | Admit: 2013-08-15 | Discharge: 2013-08-15 | Discharge status: Against Medical Advice | Payer: BC Managed Care – PPO | Source: Home / Self Care

## 2013-08-15 ENCOUNTER — Encounter (HOSPITAL_COMMUNITY): Payer: Self-pay | Admitting: Emergency Medicine

## 2013-08-15 DIAGNOSIS — F419 Anxiety disorder, unspecified: Secondary | ICD-10-CM

## 2013-08-15 DIAGNOSIS — F411 Generalized anxiety disorder: Secondary | ICD-10-CM

## 2013-08-15 NOTE — ED Provider Notes (Signed)
CSN: [id number]     Arrival date & time 08/15/13  1545 History   None    Chief Complaint  Patient presents with  . Anxiety     (Consider location/radiation/quality/duration/timing/severity/associated sxs/prior Treatment) HPI Comments: Patient presented for "anxiety medications". He reports no suicidal tendencies or harm to others. Has not seen Dr. Parke SimmersBland in 4 months. Out of Prozac and Xanax.   Patient is a 23 y.o. male presenting with anxiety. The history is provided by the patient.  Anxiety    Past Medical History  Diagnosis Date  . Anxiety    Past Surgical History  Procedure Laterality Date  . Adenoidectomy     Family History  Problem Relation Age of Onset  . Cancer Paternal Uncle   . Cancer Maternal Grandmother     breast   . Hypertension Maternal Grandmother   . Depression Mother    History  Substance Use Topics  . Smoking status: Current Every Day Smoker -- 0.50 packs/day    Types: Cigarettes  . Smokeless tobacco: Not on file  . Alcohol Use: Yes    Review of Systems    Allergies  Review of patient's allergies indicates no known allergies.  Home Medications   Current Outpatient Rx  Name  Route  Sig  Dispense  Refill  . ALPRAZolam (XANAX) 0.25 MG tablet      TAKE 1 TABLET BY MOUTH TWICE A DAY AS NEEDED   20 tablet   0   . fluconazole (DIFLUCAN) 100 MG tablet   Oral   Take 1 tablet (100 mg total) by mouth daily.   7 tablet   0   . FLUoxetine (PROZAC) 20 MG tablet   Oral   Take 1 tablet (20 mg total) by mouth daily.   30 tablet   3    BP 115/73  Pulse 61  Temp(Src) 98.1 F (36.7 C) (Oral)  Resp 18  SpO2 97% Physical Exam  ED Course  Procedures (including critical care time) Labs Review Labs Reviewed - No data to display Imaging Review No results found.    MDM   Final diagnoses:  Anxiety   Discussed with patient that we do not usually prescribe theses medications as they require f/u. I was going to talk through his issues,  but he walked out, without an offical discharge.     Riki SheerMichelle G Young, PA-C 08/15/13 1630

## 2013-08-15 NOTE — ED Notes (Signed)
Pt   Was noticed  By  This     Horticulturist, commercialWriter       Walking       Out  Of  Treatment  Room       After  Interacting  With the  Provider

## 2013-08-15 NOTE — ED Provider Notes (Signed)
Medical screening examination/treatment/procedure(s) were performed by resident physician or non-physician practitioner and as supervising physician I was immediately available for consultation/collaboration.   Aizza Santiago DOUGLAS MD.   Roman Dubuc D Mirl Hillery, MD 08/15/13 1800 

## 2013-08-15 NOTE — ED Notes (Signed)
Pt  treports  A  History  Of  Anxiety   In  Past  He   Reports  He  Has  Not  Seen a  Doctor  In  4  Months   He  Reports  Has  Been on meds  Before  But is  Now  Out      denys  Hearing any  Voices  denys  Any  Suicidal ideations

## 2013-12-28 ENCOUNTER — Emergency Department (HOSPITAL_COMMUNITY)
Admission: EM | Admit: 2013-12-28 | Discharge: 2013-12-28 | Disposition: A | Discharge status: Home / Self Care | Payer: BC Managed Care – PPO | Attending: Emergency Medicine | Admitting: Emergency Medicine

## 2013-12-28 ENCOUNTER — Encounter (HOSPITAL_COMMUNITY): Payer: Self-pay | Admitting: Emergency Medicine

## 2013-12-28 ENCOUNTER — Emergency Department (HOSPITAL_COMMUNITY): Payer: BC Managed Care – PPO

## 2013-12-28 DIAGNOSIS — IMO0002 Reserved for concepts with insufficient information to code with codable children: Secondary | ICD-10-CM

## 2013-12-28 DIAGNOSIS — F17200 Nicotine dependence, unspecified, uncomplicated: Secondary | ICD-10-CM | POA: Insufficient documentation

## 2013-12-28 DIAGNOSIS — Y92410 Unspecified street and highway as the place of occurrence of the external cause: Secondary | ICD-10-CM | POA: Insufficient documentation

## 2013-12-28 DIAGNOSIS — S239XXA Sprain of unspecified parts of thorax, initial encounter: Secondary | ICD-10-CM | POA: Insufficient documentation

## 2013-12-28 DIAGNOSIS — Y9389 Activity, other specified: Secondary | ICD-10-CM | POA: Insufficient documentation

## 2013-12-28 DIAGNOSIS — Z8659 Personal history of other mental and behavioral disorders: Secondary | ICD-10-CM | POA: Insufficient documentation

## 2013-12-28 MED ORDER — IBUPROFEN 400 MG PO TABS
800.0000 mg | ORAL_TABLET | Freq: Once | ORAL | Status: AC
  Administered 2013-12-28: 800 mg via ORAL
  Filled 2013-12-28: qty 2

## 2013-12-28 MED ORDER — CYCLOBENZAPRINE HCL 10 MG PO TABS: 10.0000 mg | ORAL_TABLET | Freq: Two times a day (BID) | ORAL | Status: DC | PRN
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
## 2013-12-28 NOTE — Discharge Instructions (Signed)
For pain control you may take up to 800mg  of ibuprofen (that is usually 4 over the counter pills)  3 times a day (take with food) and acetaminophen 975mg  (this is 3 over the counter pills) four times a day. Do not drink alcohol or combine with other medications that have acetaminophen as an ingredient (Read the labels!).    For breakthrough pain you may take Flexeril. Do not drink alcohol, drive or operate heavy machinery when taking Flexeril.  Please follow with your primary care doctor in the next 2 days for a check-up. They must obtain records for further management.   Do not hesitate to return to the Emergency Department for any new, worsening or concerning symptoms.   Mid-Back Strain with Rehab  A strain is an injury in which a tendon or muscle is torn. The muscles and tendons of the mid-back are vulnverable to strains. However, these muscles and tendons are very strong and require a great force to be injured. The muscles of the mid-back are responsible for stabilizing the spinal column, as well as spinal twisting (rotation). Strains are classified into three categories. Grade 1 strains cause pain, but the tendon is not lengthened. Grade 2 strains include a lengthened ligament, due to the ligament being stretched or partially ruptured. With grade 2 strains there is still function, although the function may be decreased. Grade 3 strains involve a complete tear of the tendon or muscle, and function is usually impaired. SYMPTOMS   Pain in the middle of the back.  Pain that may affect only one side, and is worse with movement.  Muscle spasms, and often swelling in the back.  Loss of strength of the back muscles.  Crackling sound (crepitation) when the muscles are touched. CAUSES  Mid-back strains occur when a force is placed on the muscles or tendons that is greater than they can handle. Common causes of injury include:  Ongoing overuse of the muscle-tendon units in the middle back, usually  from incorrect body posture.  A single violent injury or force applied to the back. RISK INCREASES WITH:  Sports that involve twisting forces on the spine or a lot of bending at the waist (football, rugby, weightlifting, bowling, golf, tennis, speed skating, racquetball, swimming, running, gymnastics, diving).  Poor strength and flexibility.  Failure to warm up properly before activity.  Family history of low back pain or disk disorders.  Previous back injury or surgery (especially fusion). PREVENTION  Learn and use proper sports technique.  Warm up and stretch properly before activity.  Allow for adequate recovery between workouts.  Maintain physical fitness:  Strength, flexibility, and endurance.  Cardiovascular fitness. PROGNOSIS  If treated properly, mid-back strains usually heal within 6 weeks. RELATED COMPLICATIONS   Frequently recurring symptoms, resulting in a chronic problem. Properly treating the problem the first time decreases frequency of recurrence.  Chronic inflammation, scarring, and partial muscle-tendon tear.  Delayed healing or resolution of symptoms, especially if activity is resumed too soon.  Prolonged disability. TREATMENT Treatment first involves the use of ice and medicine, to reduce pain and inflammation. As the pain begins to subside, you may begin strengthening and stretching exercises to improve body posture and sport technique. These exercises may be performed at home or with a therapist. Severe injuries may require referral to a therapist for further evaluation and treatment, such as ultrasound. Corticosteroid injections may be given to help reduce inflammation. Biofeedback (watching monitors of your body processes) and psychotherapy may also be prescribed. Prolonged bed  rest is felt to do more harm than good. Massage may help break the muscle spasms. Sometimes, an injection of cortisone, with or without local anesthetics, may be given to help  relieve the pain and spasms. MEDICATION   If pain medicine is needed, nonsteroidal anti-inflammatory medicines (aspirin and ibuprofen), or other minor pain relievers (acetaminophen), are often advised.  Do not take pain medicine for 7 days before surgery.  Prescription pain relievers may be given, if your caregiver thinks they are needed. Use only as directed and only as much as you need.  Ointments applied to the skin may be helpful.  Corticosteroid injections may be given by your caregiver. These injections should be reserved for the most serious cases, because they may only be given a certain number of times. HEAT AND COLD:   Cold treatment (icing) should be applied for 10 to 15 minutes every 2 to 3 hours for inflammation and pain, and immediately after activity that aggravates your symptoms. Use ice packs or an ice massage.  Heat treatment may be used before performing stretching and strengthening activities prescribed by your caregiver, physical therapist, or athletic trainer. Use a heat pack or a warm water soak. SEEK IMMEDIATE MEDICAL CARE IF:  Symptoms get worse or do not improve in 2 to 4 weeks, despite treatment.  You develop numbness, weakness, or loss of bowel or bladder function.  New, unexplained symptoms develop. (Drugs used in treatment may produce side effects.) EXERCISES RANGE OF MOTION (ROM) AND STRETCHING EXERCISES - Mid-Back Strain These exercises may help you when beginning to rehabilitate your injury. In order to successfully resolve your symptoms, you must improve your posture. These exercises are designed to help reduce the forward-head and rounded-shoulder posture which contributes to this condition. Your symptoms may resolve with or without further involvement from your physician, physical therapist or athletic trainer. While completing these exercises, remember:   Restoring tissue flexibility helps normal motion to return to the joints. This allows healthier,  less painful movement and activity.  An effective stretch should be held for at least 30 seconds.  A stretch should never be painful. You should only feel a gentle lengthening or release in the stretched tissue. STRETECH - Axial Extension  Stand or sit on a firm surface. Assume a good posture: chest up, shoulders drawn back, stomach muscles slightly tense, knees unlocked (if standing) and feet hip width apart.  Slowly retract your chin, so your head slides back and your chin slightly lowers. Continue to look straight ahead.  You should feel a gentle stretch in the back of your head. Be certain not to feel an aggressive stretch since this can cause headaches later.  Hold for __________ seconds. Repeat __________ times. Complete this exercise __________ times per day. RANGE OF MOTION- Upper Thoracic Extension  Sit on a firm chair with a high back. Assume a good posture: chest up, shoulders drawn back, abdominal muscles slightly tense, and feet hip width apart. Place a small pillow or folded towel in the curve of your lower back, if you are having difficulty maintaining good posture.  Gently brace your neck with your hands, allowing your arms to rest on your chest.  Continue to support your neck and slowly extend your back over the chair. You will feel a stretch across your upper back.  Hold __________ seconds. Slowly return to the starting position. Repeat __________ times. Complete this exercise __________ times per day. RANGE OF MOTION- Mid-Thoracic Extension  Roll a towel so that it  is about 4 inches in diameter.  Position the towel lengthwise. Lay on the towel so that your spine, but not your shoulder blades, are supported.  You should feel your mid-back arching toward the floor. To increase the stretch, extend your arms away from your body.  Hold for __________ seconds. Repeat exercise __________ times, __________ times per day. STRENGTHENING EXERCISES - Mid-Back Strain These  exercises may help you when beginning to rehabilitate your injury. They may resolve your symptoms with or without further involvement from your physician, physical therapist or athletic trainer. While completing these exercises, remember:   Muscles can gain both the endurance and the strength needed for everyday activities through controlled exercises.  Complete these exercises as instructed by your physician, physical therapist or athletic trainer. Increase the resistance and repetitions only as guided by your caregiver.  You may experience muscle soreness or fatigue, but the pain or discomfort you are trying to eliminate should never worsen during these exercises. If this pain does worsen, stop and make certain you are following the directions exactly. If the pain is still present after adjustments, discontinue the exercise until you can discuss the trouble with your caregiver. STRENGTHENING - Quadruped, Opposite UE/LE Lift  Assume a hands and knees position on a firm surface. Keep your hands under your shoulders and your knees under your hips. You may place padding under your knees for comfort.  Find your neutral spine and gently tense your abdominal muscles so that you can maintain this position. Your shoulders and hips should form a rectangle that is parallel with the floor and is not twisted.  Keeping your trunk steady, lift your right hand no higher than your shoulder and then your left leg no higher than your hip. Make sure you are not holding your breath. Hold this position __________ seconds.  Continuing to keep your abdominal muscles tense and your back steady, slowly return to your starting position. Repeat with the opposite arm and leg. Repeat __________ times. Complete this exercise __________ times per day.  STRENGTH - Shoulder Extensors  Secure a rubber exercise band or tubing to a fixed object (table, pole) so that it is at the height of your shoulders when you are either  standing, or sitting on a firm armless chair.  With a thumbs-up grip, grasp an end of the band in each hand. Straighten your elbows and lift your hands straight in front of you at shoulder height. Step back away from the secured end of band, until it becomes tense.  Squeezing your shoulder blades together, pull your hands down to the sides of your thighs. Do not allow your hands to go behind you.  Hold for __________ seconds. Slowly ease the tension on the band, as you reverse the directions and return to the starting position. Repeat __________ times. Complete this exercise __________ times per day.  STRENGTH - Horizontal Abductors Choose one of the two positions to complete this exercise. Prone: lying on stomach:  Lie on your stomach on a firm surface so that your right / left arm overhangs the edge. Rest your forehead on your opposite forearm. With your palm facing the floor and your elbow straight, hold a __________ weight in your hand.  Squeeze your right / left shoulder blade to your mid-back spine and then slowly raise your arm to the height of the bed.  Hold for __________ seconds. Slowly reverse the directions and return to the starting position, controlling the weight as you lower your arm. Repeat  __________ times. Complete this exercise __________ times per day. Standing:   Secure a rubber exercise band or tubing, so that it is at the height of your shoulders when you are either standing, or sitting on a firm armless chair.  Grasp an end of the band in each hand and have your palms face each other. Straighten your elbows and lift your hands straight in front of you at shoulder height. Step back away from the secured end of band, until it becomes tense.  Squeeze your shoulder blades together. Keeping your elbows locked and your hands at shoulder height, spread your arms apart, forming a "T" shape with your body. Hold __________ seconds. Slowly ease the tension on the band, as you  reverse the directions and return to the starting position. Repeat __________ times. Complete this exercise __________ times per day. STRENGTH - Scapular Retractors and External Rotators, Rowing  Secure a rubber exercise band or tubing, so that it is at the height of your shoulders when you are either standing, or sitting on a firm armless chair.  With a palm-down grip, grasp an end of the band in each hand. Straighten your elbows and lift your hands straight in front of you at shoulder height. Step back away from the secured end of band, until it becomes tense.  Step 1: Squeeze your shoulder blades together. Bending your elbows, draw your hands to your chest as if you are rowing a boat. At the end of this motion, your hands and elbow should be at shoulder height and your elbows should be out to your sides.  Step 2: Rotate your shoulder to raise your hands above your head. Your forearms should be vertical and your upper arms should be horizontal.  Hold for __________ seconds. Slowly ease the tension on the band, as you reverse the directions and return to the starting position. Repeat __________ times. Complete this exercise __________ times per day.  POSTURE AND BODY MECHANICS CONSIDERATIONS - Mid-Back Strain Keeping correct posture when sitting, standing or completing your activities will reduce the stress put on different body tissues, allowing injured tissues a chance to heal and limiting painful experiences. The following are general guidelines for improved posture. Your physician or physical therapist will provide you with any instructions specific to your needs. While reading these guidelines, remember:  The exercises prescribed by your provider will help you have the flexibility and strength to maintain correct postures.  The correct posture provides the best environment for your joints to work. All of your joints have less wear and tear when properly supported by a spine with good  posture. This means you will experience a healthier, less painful body.  Correct posture must be practiced with all of your activities, especially prolonged sitting and standing. Correct posture is as important when doing repetitive low-stress activities (typing) as it is when doing a single heavy-load activity (lifting). PROPER SITTING POSTURE In order to minimize stress and discomfort on your spine, you must sit with correct posture. Sitting with good posture should be effortless for a healthy body. Returning to good posture is a gradual process. Many people can work toward this most comfortably by using various supports until they have the flexibility and strength to maintain this posture on their own. When sitting with proper posture, your ears will fall over your shoulders and your shoulders will fall over your hips. You should use the back of the chair to support your upper back. Your lower back will be in a  neutral position, just slightly arched. You may place a small pillow or folded towel at the base of your low back for  support.  When working at a desk, create an environment that supports good, upright posture. Without extra support, muscles fatigue and lead to excessive strain on joints and other tissues. Keep these recommendations in mind: CHAIR:  A chair should be able to slide under your desk when your back makes contact with the back of the chair. This allows you to work closely.  The chair's height should allow your eyes to be level with the upper part of your monitor and your hands to be slightly lower than your elbows. BODY POSITION  Your feet should make contact with the floor. If this is not possible, use a foot rest.  Keep your ears over your shoulders. This will reduce stress on your neck and lower back. INCORRECT SITTING POSTURES If you are feeling tired and unable to assume a healthy sitting posture, do not slouch or slump. This puts excessive strain on your back tissues,  causing more damage and pain. Healthier options include:  Using more support, like a lumbar pillow.  Switching tasks to something that requires you to be upright or walking.  Talking a brief walk.  Lying down to rest in a neutral-spine position. CORRECT STANDING POSTURES Proper standing posture should be assumed with all daily activities, even if they only take a few moments, like when brushing your teeth. As in sitting, your ears should fall over your shoulders and your shoulders should fall over your hips. You should keep a slight tension in your abdominal muscles to brace your spine. Your tailbone should point down to the ground, not behind your body, resulting in an over-extended swayback posture.  INCORRECT STANDING POSTURES Common incorrect standing postures include a forward head, locked knees, and an excessive swayback. WALKING Walk with an upright posture. Your ears, shoulders and hips should all line-up. CORRECT LIFTING TECHNIQUES DO :   Assume a wide stance. This will provide you more stability and the opportunity to get as close as possible to the object which you are lifting.  Tense your abdominals to brace your spine. Bend at the knees and hips. Keeping your back locked in a neutral-spine position, lift using your leg muscles. Lift with your legs, keeping your back straight.  Test the weight of unknown objects before attempting to lift them.  Try to keep your elbows locked down at your sides in order get the best strength from your shoulders when carrying an object.  Always ask for help when lifting heavy or awkward objects. INCORRECT LIFTING TECHNIQUES DO NOT:   Lock your knees when lifting, even if it is a small object.  Bend and twist. Pivot at your feet or move your feet when needing to change directions.  Assume that you can safely pick up even a paperclip without proper posture. Document Released: 06/17/2005 Document Revised: 09/09/2011 Document Reviewed:  09/29/2008 [facility] Patient Information 2015 [place], [place]. This information is not intended to replace advice given to you by your health care provider. Make sure you discuss any questions you have with your health care provider.

## 2013-12-29 NOTE — ED Provider Notes (Signed)
Medical screening examination/treatment/procedure(s) were performed by non-physician practitioner and as supervising physician I was immediately available for consultation/collaboration.   EKG Interpretation None       Mark James, MRolland Porter 12/29/13 [phone]

## 2014-12-24 ENCOUNTER — Encounter (HOSPITAL_COMMUNITY): Payer: Self-pay | Admitting: *Deleted

## 2014-12-24 ENCOUNTER — Emergency Department (HOSPITAL_COMMUNITY)
Admission: EM | Admit: 2014-12-24 | Discharge: 2014-12-24 | Discharge status: Against Medical Advice | Payer: Self-pay | Attending: Emergency Medicine | Admitting: Emergency Medicine

## 2014-12-24 DIAGNOSIS — R21 Rash and other nonspecific skin eruption: Secondary | ICD-10-CM | POA: Insufficient documentation

## 2014-12-24 DIAGNOSIS — Z720 Tobacco use: Secondary | ICD-10-CM | POA: Insufficient documentation

## 2014-12-24 NOTE — ED Provider Notes (Signed)
I did not see or evaluate this patient.  This patient left without being seen by a medical provider after triage in the emergency department  Azalia Bilis, MD 12/24/14 [phone]

## 2014-12-24 NOTE — ED Notes (Signed)
Pt c/o rash to arms, legs, face and back since last night. States he has had it before and was told that it was related to his laundry detergent. States that he did recently change his laundry detergent.

## 2015-02-28 ENCOUNTER — Encounter (HOSPITAL_COMMUNITY): Payer: Self-pay | Admitting: Emergency Medicine

## 2015-02-28 ENCOUNTER — Emergency Department (HOSPITAL_COMMUNITY)
Admission: EM | Admit: 2015-02-28 | Discharge: 2015-02-28 | Disposition: A | Discharge status: Home / Self Care | Payer: BLUE CROSS/BLUE SHIELD | Attending: Emergency Medicine | Admitting: Emergency Medicine

## 2015-02-28 DIAGNOSIS — Z8659 Personal history of other mental and behavioral disorders: Secondary | ICD-10-CM | POA: Insufficient documentation

## 2015-02-28 DIAGNOSIS — R112 Nausea with vomiting, unspecified: Secondary | ICD-10-CM

## 2015-02-28 DIAGNOSIS — Z87891 Personal history of nicotine dependence: Secondary | ICD-10-CM | POA: Insufficient documentation

## 2015-02-28 LAB — COMPREHENSIVE METABOLIC PANEL
ALK PHOS: 64 U/L (ref 38–126)
ALT: 22 U/L (ref 17–63)
ANION GAP: 4 — AB (ref 5–15)
AST: 20 U/L (ref 15–41)
Albumin: 4.2 g/dL (ref 3.5–5.0)
BILIRUBIN TOTAL: 1.4 mg/dL — AB (ref 0.3–1.2)
BUN: 9 mg/dL (ref 6–20)
CALCIUM: 9 mg/dL (ref 8.9–10.3)
CO2: 28 mmol/L (ref 22–32)
CREATININE: 0.84 mg/dL (ref 0.61–1.24)
Chloride: 106 mmol/L (ref 101–111)
Glucose, Bld: 100 mg/dL — ABNORMAL HIGH (ref 65–99)
Potassium: 3.6 mmol/L (ref 3.5–5.1)
SODIUM: 138 mmol/L (ref 135–145)
TOTAL PROTEIN: 7.1 g/dL (ref 6.5–8.1)

## 2015-02-28 LAB — CBC WITH DIFFERENTIAL/PLATELET
BASOS ABS: 0 10*3/uL (ref 0.0–0.1)
BASOS PCT: 0 % (ref 0–1)
EOS ABS: 0 10*3/uL (ref 0.0–0.7)
EOS PCT: 0 % (ref 0–5)
HCT: 37.8 % — ABNORMAL LOW (ref 39.0–52.0)
HEMOGLOBIN: 12.8 g/dL — AB (ref 13.0–17.0)
Lymphocytes Relative: 10 % — ABNORMAL LOW (ref 12–46)
Lymphs Abs: 1.1 10*3/uL (ref 0.7–4.0)
MCH: 28.5 pg (ref 26.0–34.0)
MCHC: 33.9 g/dL (ref 30.0–36.0)
MCV: 84.2 fL (ref 78.0–100.0)
Monocytes Absolute: 0.4 10*3/uL (ref 0.1–1.0)
Monocytes Relative: 4 % (ref 3–12)
NEUTROS PCT: 86 % — AB (ref 43–77)
Neutro Abs: 9.3 10*3/uL — ABNORMAL HIGH (ref 1.7–7.7)
PLATELETS: 169 10*3/uL (ref 150–400)
RBC: 4.49 MIL/uL (ref 4.22–5.81)
RDW: 13.4 % (ref 11.5–15.5)
WBC: 10.8 10*3/uL — AB (ref 4.0–10.5)

## 2015-02-28 LAB — LIPASE, BLOOD: LIPASE: 11 U/L — AB (ref 22–51)

## 2015-02-28 MED ORDER — FAMOTIDINE 20 MG PO TABS
ORAL_TABLET | ORAL | Status: AC
  Filled 2015-02-28: qty 2

## 2015-02-28 MED ORDER — ONDANSETRON HCL 4 MG PO TABS: 4.0000 mg | ORAL_TABLET | Freq: Three times a day (TID) | ORAL | Status: DC | PRN

## 2015-02-28 MED ORDER — ONDANSETRON 8 MG PO TBDP
8.0000 mg | ORAL_TABLET | Freq: Once | ORAL | Status: AC
  Administered 2015-02-28: 8 mg via ORAL
  Filled 2015-02-28: qty 1

## 2015-02-28 MED ORDER — FAMOTIDINE 20 MG PO TABS
40.0000 mg | ORAL_TABLET | Freq: Once | ORAL | Status: AC
  Administered 2015-02-28: 40 mg via ORAL

## 2015-02-28 NOTE — ED Notes (Signed)
Vomited for last two days.  C/o burning.  Denies any other pain.  Vomited 3 times in last 24 hours.

## 2015-02-28 NOTE — Discharge Instructions (Signed)
°Emergency Department Resource Guide °1) Find a Doctor and Pay Out of Pocket °Although you won't have to find out who is covered by your insurance plan, it is a good idea to ask around and get recommendations. You will then need to call the office and see if the doctor you have chosen will accept you as a new patient and what types of options they offer for patients who are self-pay. Some doctors offer discounts or will set up payment plans for their patients who do not have insurance, but you will need to ask so you aren't surprised when you get to your appointment. ° °2) Contact Your Local Health Department °Not all health departments have doctors that can see patients for sick visits, but many do, so it is worth a call to see if yours does. If you don't know where your local health department is, you can check in your phone book. The CDC also has a tool to help you locate your state's health department, and many state websites also have listings of all of their local health departments. ° °3) Find a Walk-in Clinic °If your illness is not likely to be very severe or complicated, you may want to try a walk in clinic. These are popping up all over the country in pharmacies, drugstores, and shopping centers. They're usually staffed by nurse practitioners or physician assistants that have been trained to treat common illnesses and complaints. They're usually fairly quick and inexpensive. However, if you have serious medical issues or chronic medical problems, these are probably not your best option. ° °No Primary Care Doctor: °- Call [facility] at  832-8000 - they can help you locate a primary care doctor that  accepts your insurance, provides certain services, etc. °- Physician Referral Service- 1-800-533-3463 ° °Chronic Pain Problems: °Organization         Address  Phone   Notes  °[facility]  (336) 297-2271 Patients need to be referred by their primary care doctor.  ° °Medication  Assistance: °Organization         Address  Phone   Notes  °[place] [facility] [address] °[place], [place] [address] (336) 641-8030 --Must be a resident of [place] °-- Must have NO insurance coverage whatsoever (no Medicaid/ Medicare, etc.) °-- The pt. MUST have a primary care doctor that directs their care regularly and follows them in the community °  °[facility]  (866) 331-1348   °[employer]  (888) 892-1162   ° °Agencies that provide inexpensive medical care: °Organization         Address  Phone   Notes  °[facility]  (336) 832-8035   °[facility]    (336) 832-7272   °[facility] [address] °[place], [place] [address] (336) 832-4777   °[facility] [address], °[place] (336) 271-4999   °[facility]    (336) 373-0678   °[facility]    (336) 272-1050   °[facility] ° [address], [place] Phone:  (336) 832-4444, Fax:  (336) 832-4440 Hours of Operation:  9 am - 6 pm, M-F.  Also accepts Medicaid/Medicare and self-pay.  °[facility] ° [address], [place] Phone: (336) 832-3150, Fax: (336) [phone]. Hours of Operation:  8:30 am - 5:30 pm, M-F.  Also accepts Medicaid and self-pay.  °[facility] [place] [address], [place] Phone: (336) 878-6027   °[facility] [address], [place], [place] (336)723-1848, Ext. 123 Mondays & Thursdays: 7-9 AM.  First 15 patients are seen on a first come, first serve basis. °  ° °Medicaid-accepting [place] Providers: ° °Organization         Address  Phone   Notes  °[facility] [address], [place] (336) 641-2100 Also accepts self-pay patients.  °[facility] [address], [place] ° (336) 856-9996   °[facility] [address], [place]  (336) 288-8857   °[facility] [address] [place] [address], [place] (336) 299-7000   °Veita Bland [address], [place]  ° (336) 373-1557 Only accepts [place] Access Medicaid patients after they have their name applied to their card.  ° °Self-Pay (no insurance) in [place]: ° °Organization         Address  Phone   Notes  °Sickle Cell Patients, [facility] [address], [place] (336) 832-1970   °[facility] [address], [place] (336) 832-4400   °[facility] ° [address], [place] (336) 992-4800   °[facility]/Dr. Osei-Bonsu ° [address] [place] [address], [place] or [address], [place] (336) [phone] Phone number for both [place] and [place] locations is the same.  °[facility] [address], [place] (336) 299-0000   °[facility] [address] [place] [address], [place] or [address] (336) 852-7530 °(336) 878-2260   °[facility] [address], [place] (336) 350-1642, phone; (336) 294-5005, fax Sees patients 1st and 3rd Saturday of every month.  Must not qualify for public or private insurance (i.e. Medicaid, Medicare, [place] Health Choice, Veterans' Benefits) • Household income should be no more than 200% of the poverty level •The clinic cannot treat you if you are pregnant or think you are pregnant • Sexually transmitted diseases are not treated at the clinic.  ° ° °Dental Care: °Organization         Address  Phone  Notes  °[place] [facility] [address], [place] (336) 641-6152 Accepts children up to age 21 who are enrolled in Medicaid or [place] Health Choice; pregnant women with a Medicaid card; and children who have applied for Medicaid or [place] Health Choice, but were declined, whose parents can pay a reduced fee at time of service.  °[place]  [facility] [place]  [address], [place] (336) 641-7733 Accepts children up to age 21 who are enrolled in Medicaid or [place] Health Choice; pregnant women with a Medicaid card; and children who have applied for Medicaid or [place] Health Choice, but were declined, whose parents can pay a reduced fee at time of service.  °[facility] ° [address], [place] (336) 641-4533 Patients are seen by appointment only. Walk-ins are not accepted. [facility] will see patients 18 years of age and older. °Monday - Tuesday (8am-5pm) °Most Wednesdays (8:30-5pm) °$30 per visit, cash only  °[facility] ° [address], [place] (336) 641-4533 Patients are seen by appointment only. Walk-ins are not accepted. [facility] will see patients 18 years of age and older. °One   Wednesday Evening (Monthly: Volunteer Based).  $30 per visit, cash only  °[facility]  (919) 537-3737 for adults; Children under age 4, call Graduate Pediatric Dentistry at ([phone]) [phone]. Children aged 4-14, please call (919) [phone] to request a pediatric application. ° Dental services are provided in all areas of dental care including fillings, crowns and bridges, complete and partial dentures, implants, gum treatment, root canals, and extractions. Preventive care is also provided. Treatment is provided to both adults and children. °Patients are selected via a lottery and there is often a waiting list. °  °[facility] [address], °[place] ° (336) 763-8833 [email or web address] °  °[facility] [address], [place], [place] (336)723-1848, Ext. 123 Second and Fourth Thursday of each month, opens at 6:30 AM; Clinic ends at 9 AM.  Patients are seen on a first-come first-served basis, and a limited number are seen during each clinic.  ° °[facility] ° [address], [place], [place] (336) 723-7904    Eligibility Requirements °You must have lived in [place], or [place] counties for at least the last three months. °  You cannot be eligible for state or federal sponsored healthcare insurance, including Veterans Administration, Medicaid, or Medicare. °  You generally cannot be eligible for healthcare insurance through your employer.  °  How to apply: °Eligibility screenings are held every Tuesday and Wednesday afternoon from 1:00 pm until 4:00 pm. You do not need an appointment for the interview!  °[facility] [address], [place], [place] 336-631-2330   °[facility]  336-342-8273   °[facility]  336-703-3100   °[facility]  336-570-6415   ° °Behavioral Health Resources in the Community: °Intensive Outpatient Programs °Organization         Address  Phone  Notes  °[facility] [address], [place], [place] 336-878-6098   °[facility] [address], [place], [place] 336-832-9800   °[facility] [address], [place], [place] ° 336-882-2125   °[facility] [address],  °[place], [place] 1-800-853-5163 or 336-641-4981   °Substance Abuse Resources °Organization         Address  Phone  Notes  °[facility]  336-882-2125   °[facility]  336-784-9470   °[facility]  336-285-9073   °[facility]  336-845-3988   °Residential & Outpatient Substance Abuse Program  1-800-659-3381   °Psychological Services °Organization         Address  Phone  Notes  °[facility]  336- 832-9600   °[facility]  336- 378-7881   °[facility] [address], [place] 1-800-853-5163 or 336-641-4981   ° °Mobile Crisis Teams °Organization         Address  Phone  Notes  °[facility], Mobile Crisis Care Unit  1-877-626-1772   °Assertive °[facility] ° [address]  [place], [place] 336-834-9664   °Sharon DeEsch [address] °[place] 336-554-5454   ° °Self-Help/Support Groups °Organization         Address  Phone             Notes  °[facility]  - variety of support groups  336- 373-1402 Call for more information  °Narcotics Anonymous (NA), [facility] [address], °[place]  2 meetings at this location  ° °  Residential Treatment Programs Organization         Address  Phone  Notes  [facility] [address]    [place]  1-610-960-4540   [facility]  [address] [place] [address], [place], [place] [phone]   [facility] [address] [place], [place] 086-578-4696 Admissions: 8am-3pm M-F  [facility] [address],    [place], [place] 295-284-1324   [facility] [address] [place], [place], [place] 401-027-2536   [facility] [address]  [place], [place] 644-034-7425   [facility] Intensive Outpatient [address], [place], [place] 956-387-5643   [facility] ([facility] [address] [place]  [place], [place] 3-295-188-4166 or 608-196-6528   [facility] [address] [place], [place] 323-557-3220 Accepts Medicaid  [facility] [address],  [place] 2-542-706-2376 Substance Abuse/Addiction Treatment   [facility] Organization         Address  Phone  Notes  [facility]  (351) 492-8336   Angie Fava, PhD [address] [place] [place], [place]   (604) 588-4975 or 805-777-9710   [facility]   [address] [place], [place] [phone]   [facility] [address], [place], [place] (872) 416-4464 Insurance/Medicaid/sponsorship through [facility] [address]                                    [place], [place] (302) [phone] Therapy/tele-psych/case    [facility] [address], [place] [phone]    Dr. Lolly Mustache  [phone]   [facility]  [employer] [facility] Dept. 1) [address] [place] 2) [address], [place] 3)  [address], [place] ([phone]  [phone]   [facility] Child Abuse Hotline 317-408-4221 or 309-221-7460 (After Hours)      Take the prescription as directed.  Increase your fluid intake (ie:  Gatoraide) for the next few days.  Eat a bland diet and advance to your regular diet slowly as you can tolerate it.   Avoid full strength juices, as well as milk and milk products until your loose stools have resolved.   Call your regular medical doctor tomorrow to schedule a follow up appointment this week.  Return to the Emergency Department immediately if not improving (or even worsening) despite taking the medicines as prescribed, any black or bloody stool or vomit, if you develop a fever over "101," or for any other concerns.

## 2015-02-28 NOTE — ED Provider Notes (Signed)
CSN: [id number]     Arrival date & time 02/28/15  1419 History   First MD Initiated Contact with Patient 02/28/15 1507     Chief Complaint  Patient presents with  . Emesis      HPI Pt was seen at 1510. Per pt, c/o gradual onset and persistence of multiple intermittent episodes of N/V that began yesterday.  States he has had a few "loose stools." Denies abd pain, no CP/SOB, no back pain, no fevers, no black or blood in stools or emesis.    Past Medical History  Diagnosis Date  . Anxiety    Past Surgical History  Procedure Laterality Date  . Adenoidectomy     Family History  Problem Relation Age of Onset  . Cancer Paternal Uncle   . Cancer Maternal Grandmother     breast   . Hypertension Maternal Grandmother   . Depression Mother    Social History  Substance Use Topics  . Smoking status: Former Smoker -- 0.50 packs/day    Types: Cigarettes  . Smokeless tobacco: None  . Alcohol Use: Yes     Comment: social    Review of Systems ROS: Statement: All systems negative except as marked or noted in the HPI; Constitutional: Negative for fever and chills. ; ; Eyes: Negative for eye pain, redness and discharge. ; ; ENMT: Negative for ear pain, hoarseness, nasal congestion, sinus pressure and sore throat. ; ; Cardiovascular: Negative for chest pain, palpitations, diaphoresis, dyspnea and peripheral edema. ; ; Respiratory: Negative for cough, wheezing and stridor. ; ; Gastrointestinal: +N/V. Negative for diarrhea, abdominal pain, blood in stool, hematemesis, jaundice and rectal bleeding. . ; ; Genitourinary: Negative for dysuria, flank pain and hematuria. ; ; Musculoskeletal: Negative for back pain and neck pain. Negative for swelling and trauma.; ; Skin: Negative for pruritus, rash, abrasions, blisters, bruising and skin lesion.; ; Neuro: Negative for headache, lightheadedness and neck stiffness. Negative for weakness, altered level of consciousness , altered mental status, extremity  weakness, paresthesias, involuntary movement, seizure and syncope.      Allergies  Banana  Home Medications   Prior to Admission medications   Medication Sig Start Date End Date Taking? Authorizing Provider  cyclobenzaprine (FLEXERIL) 10 MG tablet Take 1 tablet (10 mg total) by mouth 2 (two) times daily as needed for muscle spasms. Patient not taking: Reported on 02/28/2015 12/28/13   Joni Reining Pisciotta, PA-C   BP 124/79 mmHg  Pulse 85  Temp(Src) 97.5 F (36.4 C) (Oral)  Resp 18  Ht 6' (1.829 m)  Wt 180 lb (81.647 kg)  BMI 24.41 kg/m2  SpO2 100% Physical Exam  1515: Physical examination:  Nursing notes reviewed; Vital signs and O2 SAT reviewed;  Constitutional: Well developed, Well nourished, Well hydrated, In no acute distress; Head:  Normocephalic, atraumatic; Eyes: EOMI, PERRL, No scleral icterus; ENMT: Mouth and pharynx normal, Mucous membranes moist; Neck: Supple, Full range of motion, No lymphadenopathy; Cardiovascular: Regular rate and rhythm, No murmur, rub, or gallop; Respiratory: Breath sounds clear & equal bilaterally, No rales, rhonchi, wheezes.  Speaking full sentences with ease, Normal respiratory effort/excursion; Chest: Nontender, Movement normal; Abdomen: Soft, Nontender, Nondistended, Normal bowel sounds; Genitourinary: No CVA tenderness; Extremities: Pulses normal, No tenderness, No edema, No calf edema or asymmetry.; Neuro: AA&Ox3, Major CN grossly intact.  Speech clear. No gross focal motor or sensory deficits in extremities.; Skin: Color normal, Warm, Dry.   ED Course  Procedures (including critical care time) Labs Review   Imaging Review  I have personally reviewed and evaluated these images and lab results as part of my medical decision-making.   EKG Interpretation None      MDM  MDM Reviewed: previous chart, nursing note and vitals Reviewed previous: labs Interpretation: labs  '   Results for orders placed or performed during the hospital  encounter of 02/28/15  Comprehensive metabolic panel  Result Value Ref Range   Sodium 138 135 - 145 mmol/L   Potassium 3.6 3.5 - 5.1 mmol/L   Chloride 106 101 - 111 mmol/L   CO2 28 22 - 32 mmol/L   Glucose, Bld 100 (H) 65 - 99 mg/dL   BUN 9 6 - 20 mg/dL   Creatinine, Ser 4.09 0.61 - 1.24 mg/dL   Calcium 9.0 8.9 - 81.1 mg/dL   Total Protein 7.1 6.5 - 8.1 g/dL   Albumin 4.2 3.5 - 5.0 g/dL   AST 20 15 - 41 U/L   ALT 22 17 - 63 U/L   Alkaline Phosphatase 64 38 - 126 U/L   Total Bilirubin 1.4 (H) 0.3 - 1.2 mg/dL   GFR calc non Af Amer >60 >60 mL/min   GFR calc Af Amer >60 >60 mL/min   Anion gap 4 (L) 5 - 15  Lipase, blood  Result Value Ref Range   Lipase 11 (L) 22 - 51 U/L  CBC with Differential  Result Value Ref Range   WBC 10.8 (H) 4.0 - 10.5 K/uL   RBC 4.49 4.22 - 5.81 MIL/uL   Hemoglobin 12.8 (L) 13.0 - 17.0 g/dL   HCT 91.4 (L) 78.2 - 95.6 %   MCV 84.2 78.0 - 100.0 fL   MCH 28.5 26.0 - 34.0 pg   MCHC 33.9 30.0 - 36.0 g/dL   RDW 21.3 08.6 - 57.8 %   Platelets 169 150 - 400 K/uL   Neutrophils Relative % 86 (H) 43 - 77 %   Neutro Abs 9.3 (H) 1.7 - 7.7 K/uL   Lymphocytes Relative 10 (L) 12 - 46 %   Lymphs Abs 1.1 0.7 - 4.0 K/uL   Monocytes Relative 4 3 - 12 %   Monocytes Absolute 0.4 0.1 - 1.0 K/uL   Eosinophils Relative 0 0 - 5 %   Eosinophils Absolute 0.0 0.0 - 0.7 K/uL   Basophils Relative 0 0 - 1 %   Basophils Absolute 0.0 0.0 - 0.1 K/uL   1710:  Pt has tol PO well while in the ED without N/V.  No stooling while in the ED.  Abd remains benign, VSS. Feels better and wants to go home now. Dx and testing d/w pt and family.  Questions answered.  Verb understanding, agreeable to d/c home with outpt f/u.      Samuel Jester, DO 03/05/15 [phone]

## 2015-03-02 ENCOUNTER — Encounter (HOSPITAL_COMMUNITY): Payer: Self-pay | Admitting: *Deleted

## 2015-03-02 ENCOUNTER — Emergency Department (HOSPITAL_COMMUNITY)
Admission: EM | Admit: 2015-03-02 | Discharge: 2015-03-02 | Disposition: A | Discharge status: Home / Self Care | Payer: BLUE CROSS/BLUE SHIELD | Attending: Emergency Medicine | Admitting: Emergency Medicine

## 2015-03-02 DIAGNOSIS — Z8659 Personal history of other mental and behavioral disorders: Secondary | ICD-10-CM | POA: Insufficient documentation

## 2015-03-02 DIAGNOSIS — R197 Diarrhea, unspecified: Secondary | ICD-10-CM | POA: Insufficient documentation

## 2015-03-02 DIAGNOSIS — Z87891 Personal history of nicotine dependence: Secondary | ICD-10-CM | POA: Insufficient documentation

## 2015-03-02 DIAGNOSIS — R112 Nausea with vomiting, unspecified: Secondary | ICD-10-CM | POA: Insufficient documentation

## 2015-03-02 LAB — URINALYSIS, ROUTINE W REFLEX MICROSCOPIC
Bilirubin Urine: NEGATIVE
GLUCOSE, UA: NEGATIVE mg/dL
Hgb urine dipstick: NEGATIVE
Ketones, ur: NEGATIVE mg/dL
LEUKOCYTES UA: NEGATIVE
Nitrite: NEGATIVE
PH: 6.5 (ref 5.0–8.0)
Protein, ur: NEGATIVE mg/dL
SPECIFIC GRAVITY, URINE: 1.015 (ref 1.005–1.030)
Urobilinogen, UA: 0.2 mg/dL (ref 0.0–1.0)

## 2015-03-02 MED ORDER — ONDANSETRON HCL 4 MG/2ML IJ SOLN
4.0000 mg | Freq: Once | INTRAMUSCULAR | Status: AC
  Administered 2015-03-02: 4 mg via INTRAVENOUS
  Filled 2015-03-02: qty 2

## 2015-03-02 MED ORDER — PROMETHAZINE HCL 25 MG PO TABS: 25.0000 mg | ORAL_TABLET | Freq: Four times a day (QID) | ORAL | Status: DC | PRN

## 2015-03-02 MED ORDER — SODIUM CHLORIDE 0.9 % IV BOLUS (SEPSIS)
1000.0000 mL | Freq: Once | INTRAVENOUS | Status: AC
  Administered 2015-03-02: 1000 mL via INTRAVENOUS

## 2015-03-02 MED ORDER — ONDANSETRON 8 MG PO TBDP: 8.0000 mg | ORAL_TABLET | Freq: Three times a day (TID) | ORAL | Status: DC | PRN

## 2015-03-02 NOTE — ED Notes (Signed)
Pt states he is feeling much better. Denies nausea.

## 2015-03-02 NOTE — ED Provider Notes (Signed)
CSN: [id number]     Arrival date & time 03/02/15  1335 History   First MD Initiated Contact with Patient 03/02/15 1402     Chief Complaint  Patient presents with  . Emesis     (Consider location/radiation/quality/duration/timing/severity/associated sxs/prior Treatment) Patient is a 24 y.o. male presenting with vomiting. The history is provided by the patient.  Emesis Associated symptoms: diarrhea   Associated symptoms: no abdominal pain and no headaches    patient presents with nausea vomiting diarrhea. Seen a couple days ago for the same. States he continues to have nausea and vomiting and now has more diarrhea. No abdominal pain. No lightheadedness. States several people work at similar symptoms. No fevers or chills. No sore throat.  Past Medical History  Diagnosis Date  . Anxiety    Past Surgical History  Procedure Laterality Date  . Adenoidectomy     Family History  Problem Relation Age of Onset  . Cancer Paternal Uncle   . Cancer Maternal Grandmother     breast   . Hypertension Maternal Grandmother   . Depression Mother    Social History  Substance Use Topics  . Smoking status: Former Smoker -- 0.50 packs/day    Types: Cigarettes  . Smokeless tobacco: None  . Alcohol Use: Yes     Comment: social    Review of Systems  Constitutional: Positive for appetite change. Negative for fever and activity change.  Eyes: Negative for pain.  Respiratory: Negative for chest tightness and shortness of breath.   Cardiovascular: Negative for chest pain and leg swelling.  Gastrointestinal: Positive for nausea, vomiting and diarrhea. Negative for abdominal pain.  Genitourinary: Negative for flank pain.  Musculoskeletal: Negative for back pain and neck stiffness.  Skin: Negative for rash.  Neurological: Negative for weakness, numbness and headaches.  Psychiatric/Behavioral: Negative for behavioral problems.      Allergies  Banana  Home Medications   Prior to Admission  medications   Medication Sig Start Date End Date Taking? Authorizing Provider  cyclobenzaprine (FLEXERIL) 10 MG tablet Take 1 tablet (10 mg total) by mouth 2 (two) times daily as needed for muscle spasms. Patient not taking: Reported on 02/28/2015 12/28/13   Joni Reining Pisciotta, PA-C  ondansetron (ZOFRAN) 4 MG tablet Take 1 tablet (4 mg total) by mouth every 8 (eight) hours as needed for nausea or vomiting. 02/28/15   Samuel Jester, DO  ondansetron (ZOFRAN-ODT) 8 MG disintegrating tablet Take 1 tablet (8 mg total) by mouth every 8 (eight) hours as needed for nausea or vomiting. 03/02/15   Benjiman Core, MD  promethazine (PHENERGAN) 25 MG tablet Take 1 tablet (25 mg total) by mouth every 6 (six) hours as needed for nausea. 03/02/15   Benjiman Core, MD   BP 107/65 mmHg  Pulse 62  Temp(Src) 97.9 F (36.6 C) (Oral)  Resp 16  Ht 6' (1.829 m)  Wt 180 lb (81.647 kg)  BMI 24.41 kg/m2  SpO2 100% Physical Exam  Constitutional: He is oriented to person, place, and time. He appears well-developed.  HENT:  Head: Normocephalic and atraumatic.  Eyes: EOM are normal. Pupils are equal, round, and reactive to light.  Neck: Normal range of motion.  Cardiovascular: Normal rate, regular rhythm and normal heart sounds.   No murmur heard. Pulmonary/Chest: Effort normal and breath sounds normal.  Abdominal: Soft. Bowel sounds are normal. He exhibits no distension and no mass. There is no tenderness. There is no rebound and no guarding.  Musculoskeletal: Normal range of motion. He exhibits  no edema.  Neurological: He is alert and oriented to person, place, and time.  Skin: Skin is warm and dry.  Nursing note and vitals reviewed.   ED Course  Procedures (including critical care time) Labs Review Labs Reviewed  URINALYSIS, ROUTINE W REFLEX MICROSCOPIC (NOT AT [facility])    Imaging Review No results found. I have personally reviewed and evaluated these images and lab results as part of my medical  decision-making.   EKG Interpretation None      MDM   Final diagnoses:  Nausea vomiting and diarrhea    Patient with nausea and vomiting and diarrhea. Recently seen for same. Overall well-appearing. Will do IV fluids and IV Zofran. Will discharge with Zofran ODT and Phenergan assuming urine looks good.    Benjiman Core, MD 03/02/15 [phone]

## 2015-03-02 NOTE — Discharge Instructions (Signed)

## 2015-03-02 NOTE — ED Notes (Signed)
Pt states here form same a couple of days ago and is still unable to keep anything down. Pt states N/V/D. Denies abd pain. Vomitied x 2-3 today. Pt states 4 loose stools today.

## 2015-03-04 ENCOUNTER — Emergency Department (HOSPITAL_COMMUNITY)
Admission: EM | Admit: 2015-03-04 | Discharge: 2015-03-04 | Disposition: A | Discharge status: Home / Self Care | Payer: BLUE CROSS/BLUE SHIELD | Attending: Emergency Medicine | Admitting: Emergency Medicine

## 2015-03-04 ENCOUNTER — Encounter (HOSPITAL_COMMUNITY): Payer: Self-pay | Admitting: Emergency Medicine

## 2015-03-04 DIAGNOSIS — R112 Nausea with vomiting, unspecified: Secondary | ICD-10-CM | POA: Insufficient documentation

## 2015-03-04 DIAGNOSIS — Z8659 Personal history of other mental and behavioral disorders: Secondary | ICD-10-CM | POA: Insufficient documentation

## 2015-03-04 DIAGNOSIS — Z87891 Personal history of nicotine dependence: Secondary | ICD-10-CM | POA: Insufficient documentation

## 2015-03-04 LAB — I-STAT CHEM 8, ED
BUN: 4 mg/dL — ABNORMAL LOW (ref 6–20)
Calcium, Ion: 1.21 mmol/L (ref 1.12–1.23)
Chloride: 102 mmol/L (ref 101–111)
Creatinine, Ser: 0.9 mg/dL (ref 0.61–1.24)
GLUCOSE: 96 mg/dL (ref 65–99)
HEMATOCRIT: 42 % (ref 39.0–52.0)
HEMOGLOBIN: 14.3 g/dL (ref 13.0–17.0)
POTASSIUM: 3.6 mmol/L (ref 3.5–5.1)
SODIUM: 143 mmol/L (ref 135–145)
TCO2: 26 mmol/L (ref 0–100)

## 2015-03-04 LAB — URINALYSIS, ROUTINE W REFLEX MICROSCOPIC
BILIRUBIN URINE: NEGATIVE
GLUCOSE, UA: NEGATIVE mg/dL
HGB URINE DIPSTICK: NEGATIVE
Ketones, ur: NEGATIVE mg/dL
Leukocytes, UA: NEGATIVE
Nitrite: NEGATIVE
PH: 8 (ref 5.0–8.0)
Protein, ur: NEGATIVE mg/dL
SPECIFIC GRAVITY, URINE: 1.015 (ref 1.005–1.030)
Urobilinogen, UA: 0.2 mg/dL (ref 0.0–1.0)

## 2015-03-04 LAB — RAPID URINE DRUG SCREEN, HOSP PERFORMED
Amphetamines: NOT DETECTED
BARBITURATES: NOT DETECTED
Benzodiazepines: NOT DETECTED
Cocaine: NOT DETECTED
Opiates: NOT DETECTED
TETRAHYDROCANNABINOL: NOT DETECTED

## 2015-03-04 MED ORDER — RANITIDINE HCL 150 MG PO TABS: 150.0000 mg | ORAL_TABLET | Freq: Two times a day (BID) | ORAL | Status: DC

## 2015-03-04 MED ORDER — DIPHENHYDRAMINE HCL 50 MG/ML IJ SOLN
25.0000 mg | Freq: Once | INTRAMUSCULAR | Status: AC
  Administered 2015-03-04: 25 mg via INTRAVENOUS
  Filled 2015-03-04: qty 1

## 2015-03-04 MED ORDER — SODIUM CHLORIDE 0.9 % IV BOLUS (SEPSIS)
1000.0000 mL | Freq: Once | INTRAVENOUS | Status: AC
Start: 2015-03-04 — End: 2015-03-04
  Administered 2015-03-04: 1000 mL via INTRAVENOUS

## 2015-03-04 MED ORDER — METOCLOPRAMIDE HCL 5 MG/ML IJ SOLN
10.0000 mg | Freq: Once | INTRAMUSCULAR | Status: AC
  Administered 2015-03-04: 10 mg via INTRAVENOUS
  Filled 2015-03-04: qty 2

## 2015-03-04 MED ORDER — ONDANSETRON HCL 4 MG/2ML IJ SOLN
4.0000 mg | Freq: Once | INTRAMUSCULAR | Status: AC
  Administered 2015-03-04: 4 mg via INTRAVENOUS
  Filled 2015-03-04: qty 2

## 2015-03-04 MED ORDER — PROMETHAZINE HCL 25 MG RE SUPP: 25.0000 mg | Freq: Four times a day (QID) | RECTAL | Status: DC | PRN

## 2015-03-04 NOTE — ED Provider Notes (Signed)
CSN: [id number]     Arrival date & time 03/04/15  1225 History   First MD Initiated Contact with Patient 03/04/15 1231     Chief Complaint  Patient presents with  . Emesis     (Consider location/radiation/quality/duration/timing/severity/associated sxs/prior Treatment) HPI Comments: Presents to the ER for evaluation of nausea and vomiting. Patient has been experiencing symptoms for several days. Patient has been seen in the ER for this recently. He reports that now he is been feeling nausea and vomiting exclusively in the morning and then it improves over the course of the day, but he still has not been able to eat. He reports that he has vomited the Phenergan backup, cannot hold down. He has not had any further diarrhea. There is no fever. There is no abdominal pain.  Patient is a 24 y.o. male presenting with vomiting.  Emesis Associated symptoms: no abdominal pain     Past Medical History  Diagnosis Date  . Anxiety    Past Surgical History  Procedure Laterality Date  . Adenoidectomy     Family History  Problem Relation Age of Onset  . Cancer Paternal Uncle   . Cancer Maternal Grandmother     breast   . Hypertension Maternal Grandmother   . Depression Mother    Social History  Substance Use Topics  . Smoking status: Former Smoker -- 0.50 packs/day    Types: Cigarettes  . Smokeless tobacco: Not on file  . Alcohol Use: Yes     Comment: occasionally    Review of Systems  Gastrointestinal: Positive for nausea and vomiting. Negative for abdominal pain and blood in stool.  All other systems reviewed and are negative.     Allergies  Banana  Home Medications   Prior to Admission medications   Medication Sig Start Date End Date Taking? Authorizing Provider  ondansetron (ZOFRAN-ODT) 8 MG disintegrating tablet Take 1 tablet (8 mg total) by mouth every 8 (eight) hours as needed for nausea or vomiting. 03/02/15  Yes Benjiman Core, MD  promethazine (PHENERGAN) 25 MG tablet  Take 1 tablet (25 mg total) by mouth every 6 (six) hours as needed for nausea. 03/02/15  Yes Benjiman Core, MD  ondansetron (ZOFRAN) 4 MG tablet Take 1 tablet (4 mg total) by mouth every 8 (eight) hours as needed for nausea or vomiting. Patient not taking: Reported on 03/04/2015 02/28/15   Samuel Jester, DO  promethazine (PHENERGAN) 25 MG suppository Place 1 suppository (25 mg total) rectally every 6 (six) hours as needed for nausea or vomiting. 03/04/15   Gilda Crease, MD  ranitidine (ZANTAC) 150 MG tablet Take 1 tablet (150 mg total) by mouth 2 (two) times daily. 03/04/15   Gilda Crease, MD   BP 107/69 mmHg  Pulse 68  Temp(Src) 97.7 F (36.5 C) (Oral)  Resp 18  Ht 6' (1.829 m)  Wt 180 lb (81.647 kg)  BMI 24.41 kg/m2  SpO2 97% Physical Exam  Constitutional: He is oriented to person, place, and time. He appears well-developed and well-nourished. No distress.  HENT:  Head: Normocephalic and atraumatic.  Right Ear: Hearing normal.  Left Ear: Hearing normal.  Nose: Nose normal.  Mouth/Throat: Oropharynx is clear and moist and mucous membranes are normal.  Eyes: Conjunctivae and EOM are normal. Pupils are equal, round, and reactive to light.  Neck: Normal range of motion. Neck supple.  Cardiovascular: Regular rhythm, S1 normal and S2 normal.  Exam reveals no gallop and no friction rub.   No murmur  heard. Pulmonary/Chest: Effort normal and breath sounds normal. No respiratory distress. He exhibits no tenderness.  Abdominal: Soft. Normal appearance and bowel sounds are normal. There is no hepatosplenomegaly. There is no tenderness. There is no rebound, no guarding, no tenderness at McBurney's point and negative Murphy's sign. No hernia.  Musculoskeletal: Normal range of motion.  Neurological: He is alert and oriented to person, place, and time. He has normal strength. No cranial nerve deficit or sensory deficit. Coordination normal. GCS eye subscore is 4. GCS verbal subscore is  5. GCS motor subscore is 6.  Skin: Skin is warm, dry and intact. No rash noted. No cyanosis.  Psychiatric: He has a normal mood and affect. His speech is normal and behavior is normal. Thought content normal.  Nursing note and vitals reviewed.   ED Course  Procedures (including critical care time) Labs Review Labs Reviewed  I-STAT CHEM 8, ED - Abnormal; Notable for the following:    BUN 4 (*)    All other components within normal limits  URINALYSIS, ROUTINE W REFLEX MICROSCOPIC (NOT AT [facility])  URINE RAPID DRUG SCREEN, HOSP PERFORMED    Imaging Review No results found. I have personally reviewed and evaluated these images and lab results as part of my medical decision-making.   EKG Interpretation None      MDM   Final diagnoses:  Nausea and vomiting, vomiting of unspecified type    Patient presents to the ER for a third visit for nausea and vomiting. Patient continues to have a completely benign abdominal exam. There is no associated pain or fever. He has not had any melanoma rectal bleeding. Patient administered IV fluids.    Gilda Crease, MD 03/07/15 [phone]

## 2015-03-04 NOTE — ED Notes (Signed)
Pt complains of vomiting and weakness since 02/27/2015. Vomiting occurs primarily in the morning.  Pt was seen in this ER two days ago and reports not being able to keep down Zofran and Phenergan. Last dose of Phenergan was this AM.

## 2015-03-04 NOTE — Discharge Instructions (Signed)

## 2016-12-20 ENCOUNTER — Emergency Department (HOSPITAL_COMMUNITY)
Admission: EM | Admit: 2016-12-20 | Discharge: 2016-12-20 | Disposition: A | Discharge status: Home / Self Care | Payer: Self-pay | Attending: Emergency Medicine | Admitting: Emergency Medicine

## 2016-12-20 ENCOUNTER — Encounter (HOSPITAL_COMMUNITY): Payer: Self-pay | Admitting: *Deleted

## 2016-12-20 DIAGNOSIS — Z5321 Procedure and treatment not carried out due to patient leaving prior to being seen by health care provider: Secondary | ICD-10-CM | POA: Insufficient documentation

## 2016-12-20 NOTE — ED Triage Notes (Signed)
Pt reports nausea and emesis x 3 days.  Pt reports being unable to eat but also to tolerate water.  Pt denies any pian. Pt reports emesis x 3-4 yesterday.  Pt a/o x 4 and ambulatory.

## 2016-12-20 NOTE — ED Notes (Signed)
Pt called,no answer.

## 2021-09-14 ENCOUNTER — Inpatient Hospital Stay (HOSPITAL_COMMUNITY)
Admission: EM | Admit: 2021-09-14 | Discharge: 2021-09-17 | DRG: 270 | Disposition: A | Discharge status: Home / Self Care | Payer: Non-veteran care | Attending: Neurology | Admitting: Neurology

## 2021-09-14 ENCOUNTER — Emergency Department (HOSPITAL_COMMUNITY): Payer: Non-veteran care

## 2021-09-14 DIAGNOSIS — I7774 Dissection of vertebral artery: Secondary | ICD-10-CM | POA: Diagnosis not present

## 2021-09-14 DIAGNOSIS — R270 Ataxia, unspecified: Secondary | ICD-10-CM | POA: Diagnosis present

## 2021-09-14 DIAGNOSIS — K30 Functional dyspepsia: Secondary | ICD-10-CM | POA: Diagnosis not present

## 2021-09-14 DIAGNOSIS — J9601 Acute respiratory failure with hypoxia: Secondary | ICD-10-CM | POA: Diagnosis present

## 2021-09-14 DIAGNOSIS — Z91018 Allergy to other foods: Secondary | ICD-10-CM

## 2021-09-14 DIAGNOSIS — Z8249 Family history of ischemic heart disease and other diseases of the circulatory system: Secondary | ICD-10-CM

## 2021-09-14 DIAGNOSIS — W06XXXA Fall from bed, initial encounter: Secondary | ICD-10-CM | POA: Diagnosis present

## 2021-09-14 DIAGNOSIS — I10 Essential (primary) hypertension: Secondary | ICD-10-CM | POA: Diagnosis present

## 2021-09-14 DIAGNOSIS — I4891 Unspecified atrial fibrillation: Secondary | ICD-10-CM | POA: Diagnosis present

## 2021-09-14 DIAGNOSIS — R1310 Dysphagia, unspecified: Secondary | ICD-10-CM | POA: Diagnosis present

## 2021-09-14 DIAGNOSIS — R471 Dysarthria and anarthria: Secondary | ICD-10-CM | POA: Diagnosis present

## 2021-09-14 DIAGNOSIS — R29706 NIHSS score 6: Secondary | ICD-10-CM | POA: Diagnosis present

## 2021-09-14 DIAGNOSIS — Q2543 Congenital aneurysm of aorta: Secondary | ICD-10-CM

## 2021-09-14 DIAGNOSIS — Z20822 Contact with and (suspected) exposure to covid-19: Secondary | ICD-10-CM | POA: Diagnosis present

## 2021-09-14 DIAGNOSIS — F411 Generalized anxiety disorder: Secondary | ICD-10-CM | POA: Diagnosis present

## 2021-09-14 DIAGNOSIS — I63541 Cerebral infarction due to unspecified occlusion or stenosis of right cerebellar artery: Secondary | ICD-10-CM | POA: Diagnosis not present

## 2021-09-14 DIAGNOSIS — I63211 Cerebral infarction due to unspecified occlusion or stenosis of right vertebral arteries: Secondary | ICD-10-CM | POA: Diagnosis present

## 2021-09-14 DIAGNOSIS — G8191 Hemiplegia, unspecified affecting right dominant side: Secondary | ICD-10-CM | POA: Diagnosis present

## 2021-09-14 DIAGNOSIS — E785 Hyperlipidemia, unspecified: Secondary | ICD-10-CM | POA: Diagnosis present

## 2021-09-14 DIAGNOSIS — G4733 Obstructive sleep apnea (adult) (pediatric): Secondary | ICD-10-CM | POA: Diagnosis present

## 2021-09-14 DIAGNOSIS — Z818 Family history of other mental and behavioral disorders: Secondary | ICD-10-CM

## 2021-09-14 DIAGNOSIS — I63531 Cerebral infarction due to unspecified occlusion or stenosis of right posterior cerebral artery: Principal | ICD-10-CM | POA: Diagnosis present

## 2021-09-14 DIAGNOSIS — E876 Hypokalemia: Secondary | ICD-10-CM | POA: Diagnosis not present

## 2021-09-14 DIAGNOSIS — R29810 Facial weakness: Secondary | ICD-10-CM | POA: Diagnosis present

## 2021-09-14 DIAGNOSIS — I639 Cerebral infarction, unspecified: Principal | ICD-10-CM

## 2021-09-14 DIAGNOSIS — Z803 Family history of malignant neoplasm of breast: Secondary | ICD-10-CM

## 2021-09-14 DIAGNOSIS — Z87891 Personal history of nicotine dependence: Secondary | ICD-10-CM

## 2021-09-14 NOTE — ED Triage Notes (Signed)
Pt BIB [facility] from home, LSN 9pm tonight, woke up at 10:45pm with right sided weakness, right facial droop, and right sided numbness. Denies headache, A&Ox4. Pt vomited on arrival, given 4mg  zofran IV. ?

## 2021-09-15 ENCOUNTER — Emergency Department (HOSPITAL_COMMUNITY): Payer: Non-veteran care

## 2021-09-15 ENCOUNTER — Inpatient Hospital Stay (HOSPITAL_COMMUNITY): Payer: Non-veteran care

## 2021-09-15 ENCOUNTER — Other Ambulatory Visit (HOSPITAL_COMMUNITY): Payer: Self-pay

## 2021-09-15 ENCOUNTER — Inpatient Hospital Stay (HOSPITAL_COMMUNITY): Payer: Non-veteran care | Admitting: Anesthesiology

## 2021-09-15 ENCOUNTER — Encounter (HOSPITAL_COMMUNITY)
Admission: EM | Disposition: A | Discharge status: Home / Self Care | Payer: Self-pay | Source: Home / Self Care | Attending: Neurology

## 2021-09-15 ENCOUNTER — Other Ambulatory Visit: Payer: Self-pay

## 2021-09-15 ENCOUNTER — Encounter (HOSPITAL_COMMUNITY): Payer: Self-pay | Admitting: Emergency Medicine

## 2021-09-15 DIAGNOSIS — Z818 Family history of other mental and behavioral disorders: Secondary | ICD-10-CM | POA: Diagnosis not present

## 2021-09-15 DIAGNOSIS — I4891 Unspecified atrial fibrillation: Secondary | ICD-10-CM

## 2021-09-15 DIAGNOSIS — W06XXXA Fall from bed, initial encounter: Secondary | ICD-10-CM | POA: Diagnosis present

## 2021-09-15 DIAGNOSIS — I63541 Cerebral infarction due to unspecified occlusion or stenosis of right cerebellar artery: Secondary | ICD-10-CM | POA: Diagnosis present

## 2021-09-15 DIAGNOSIS — R29706 NIHSS score 6: Secondary | ICD-10-CM | POA: Diagnosis present

## 2021-09-15 DIAGNOSIS — R270 Ataxia, unspecified: Secondary | ICD-10-CM | POA: Diagnosis present

## 2021-09-15 DIAGNOSIS — Z87891 Personal history of nicotine dependence: Secondary | ICD-10-CM | POA: Diagnosis not present

## 2021-09-15 DIAGNOSIS — F411 Generalized anxiety disorder: Secondary | ICD-10-CM | POA: Diagnosis present

## 2021-09-15 DIAGNOSIS — Z803 Family history of malignant neoplasm of breast: Secondary | ICD-10-CM | POA: Diagnosis not present

## 2021-09-15 DIAGNOSIS — E785 Hyperlipidemia, unspecified: Secondary | ICD-10-CM | POA: Diagnosis present

## 2021-09-15 DIAGNOSIS — I7774 Dissection of vertebral artery: Secondary | ICD-10-CM | POA: Diagnosis present

## 2021-09-15 DIAGNOSIS — I63211 Cerebral infarction due to unspecified occlusion or stenosis of right vertebral arteries: Secondary | ICD-10-CM | POA: Diagnosis present

## 2021-09-15 DIAGNOSIS — Q2543 Congenital aneurysm of aorta: Secondary | ICD-10-CM | POA: Diagnosis not present

## 2021-09-15 DIAGNOSIS — I10 Essential (primary) hypertension: Secondary | ICD-10-CM | POA: Diagnosis present

## 2021-09-15 DIAGNOSIS — G8191 Hemiplegia, unspecified affecting right dominant side: Secondary | ICD-10-CM | POA: Diagnosis present

## 2021-09-15 DIAGNOSIS — Z20822 Contact with and (suspected) exposure to covid-19: Secondary | ICD-10-CM | POA: Diagnosis present

## 2021-09-15 DIAGNOSIS — R29810 Facial weakness: Secondary | ICD-10-CM | POA: Diagnosis present

## 2021-09-15 DIAGNOSIS — Z91018 Allergy to other foods: Secondary | ICD-10-CM | POA: Diagnosis not present

## 2021-09-15 DIAGNOSIS — Z8249 Family history of ischemic heart disease and other diseases of the circulatory system: Secondary | ICD-10-CM | POA: Diagnosis not present

## 2021-09-15 DIAGNOSIS — J9601 Acute respiratory failure with hypoxia: Secondary | ICD-10-CM | POA: Diagnosis present

## 2021-09-15 DIAGNOSIS — I63531 Cerebral infarction due to unspecified occlusion or stenosis of right posterior cerebral artery: Principal | ICD-10-CM | POA: Diagnosis present

## 2021-09-15 HISTORY — PX: IR PERCUTANEOUS ART THROMBECTOMY/INFUSION INTRACRANIAL INC DIAG ANGIO: IMG6087

## 2021-09-15 HISTORY — PX: RADIOLOGY WITH ANESTHESIA: SHX6223

## 2021-09-15 HISTORY — PX: IR ANGIO VERTEBRAL SEL VERTEBRAL UNI L MOD SED: IMG5367

## 2021-09-15 HISTORY — PX: IR ANGIO INTRA EXTRACRAN SEL COM CAROTID INNOMINATE BILAT MOD SED: IMG5360

## 2021-09-15 HISTORY — PX: IR ANGIOGRAM FOLLOW UP STUDY: IMG697

## 2021-09-15 HISTORY — PX: IR TRANSCATH/EMBOLIZ: IMG695

## 2021-09-15 HISTORY — PX: IR CT HEAD LTD: IMG2386

## 2021-09-15 LAB — CBC
HCT: 43.7 % (ref 39.0–52.0)
Hemoglobin: 14.4 g/dL (ref 13.0–17.0)
MCH: 28.3 pg (ref 26.0–34.0)
MCHC: 33 g/dL (ref 30.0–36.0)
MCV: 86 fL (ref 80.0–100.0)
Platelets: 211 10*3/uL (ref 150–400)
RBC: 5.08 MIL/uL (ref 4.22–5.81)
RDW: 13.2 % (ref 11.5–15.5)
WBC: 15.2 10*3/uL — ABNORMAL HIGH (ref 4.0–10.5)
nRBC: 0 % (ref 0.0–0.2)

## 2021-09-15 LAB — APTT: aPTT: 25 seconds (ref 24–36)

## 2021-09-15 LAB — DIFFERENTIAL
Abs Immature Granulocytes: 0.2 10*3/uL — ABNORMAL HIGH (ref 0.00–0.07)
Basophils Absolute: 0 10*3/uL (ref 0.0–0.1)
Basophils Relative: 0 %
Eosinophils Absolute: 0.5 10*3/uL (ref 0.0–0.5)
Eosinophils Relative: 3 %
Lymphocytes Relative: 54 %
Lymphs Abs: 8.2 10*3/uL — ABNORMAL HIGH (ref 0.7–4.0)
Metamyelocytes Relative: 1 %
Monocytes Absolute: 0.3 10*3/uL (ref 0.1–1.0)
Monocytes Relative: 2 %
Neutro Abs: 6.1 10*3/uL (ref 1.7–7.7)
Neutrophils Relative %: 40 %
nRBC: 0 /100 WBC

## 2021-09-15 LAB — CBC WITH DIFFERENTIAL/PLATELET
Abs Immature Granulocytes: 0.06 10*3/uL (ref 0.00–0.07)
Basophils Absolute: 0 10*3/uL (ref 0.0–0.1)
Basophils Relative: 0 %
Eosinophils Absolute: 0 10*3/uL (ref 0.0–0.5)
Eosinophils Relative: 0 %
HCT: 45.4 % (ref 39.0–52.0)
Hemoglobin: 15.2 g/dL (ref 13.0–17.0)
Immature Granulocytes: 1 %
Lymphocytes Relative: 9 %
Lymphs Abs: 1.1 10*3/uL (ref 0.7–4.0)
MCH: 27.7 pg (ref 26.0–34.0)
MCHC: 33.5 g/dL (ref 30.0–36.0)
MCV: 82.7 fL (ref 80.0–100.0)
Monocytes Absolute: 0.5 10*3/uL (ref 0.1–1.0)
Monocytes Relative: 4 %
Neutro Abs: 10 10*3/uL — ABNORMAL HIGH (ref 1.7–7.7)
Neutrophils Relative %: 86 %
Platelets: 183 10*3/uL (ref 150–400)
RBC: 5.49 MIL/uL (ref 4.22–5.81)
RDW: 13.2 % (ref 11.5–15.5)
WBC: 11.7 10*3/uL — ABNORMAL HIGH (ref 4.0–10.5)
nRBC: 0 % (ref 0.0–0.2)

## 2021-09-15 LAB — MRSA NEXT GEN BY PCR, NASAL: MRSA by PCR Next Gen: NOT DETECTED

## 2021-09-15 LAB — LIPID PANEL
Cholesterol: 192 mg/dL (ref 0–200)
HDL: 45 mg/dL (ref >40)
LDL Cholesterol: 130 mg/dL — ABNORMAL HIGH (ref 0–99)
Total CHOL/HDL Ratio: 4.3 RATIO
Triglycerides: 86 mg/dL (ref <150)
VLDL: 17 mg/dL (ref 0–40)

## 2021-09-15 LAB — I-STAT CHEM 8, ED
BUN: 14 mg/dL (ref 6–20)
Calcium, Ion: 1.05 mmol/L — ABNORMAL LOW (ref 1.15–1.40)
Chloride: 101 mmol/L (ref 98–111)
Creatinine, Ser: 0.9 mg/dL (ref 0.61–1.24)
Glucose, Bld: 166 mg/dL — ABNORMAL HIGH (ref 70–99)
HCT: 44 % (ref 39.0–52.0)
Hemoglobin: 15 g/dL (ref 13.0–17.0)
Potassium: 3.1 mmol/L — ABNORMAL LOW (ref 3.5–5.1)
Sodium: 139 mmol/L (ref 135–145)
TCO2: 27 mmol/L (ref 22–32)

## 2021-09-15 LAB — RESP PANEL BY RT-PCR (FLU A&B, COVID) ARPGX2
Influenza A by PCR: NEGATIVE
Influenza B by PCR: NEGATIVE
SARS Coronavirus 2 by RT PCR: NEGATIVE

## 2021-09-15 LAB — BASIC METABOLIC PANEL
Anion gap: 10 (ref 5–15)
BUN: 8 mg/dL (ref 6–20)
CO2: 24 mmol/L (ref 22–32)
Calcium: 8.8 mg/dL — ABNORMAL LOW (ref 8.9–10.3)
Chloride: 103 mmol/L (ref 98–111)
Creatinine, Ser: 0.76 mg/dL (ref 0.61–1.24)
GFR, Estimated: >60 mL/min (ref >60)
Glucose, Bld: 132 mg/dL — ABNORMAL HIGH (ref 70–99)
Potassium: 3.4 mmol/L — ABNORMAL LOW (ref 3.5–5.1)
Sodium: 137 mmol/L (ref 135–145)

## 2021-09-15 LAB — COMPREHENSIVE METABOLIC PANEL
ALT: 54 U/L — ABNORMAL HIGH (ref 0–44)
AST: 56 U/L — ABNORMAL HIGH (ref 15–41)
Albumin: 3.8 g/dL (ref 3.5–5.0)
Alkaline Phosphatase: 67 U/L (ref 38–126)
Anion gap: 10 (ref 5–15)
BUN: 12 mg/dL (ref 6–20)
CO2: 25 mmol/L (ref 22–32)
Calcium: 8.5 mg/dL — ABNORMAL LOW (ref 8.9–10.3)
Chloride: 103 mmol/L (ref 98–111)
Creatinine, Ser: 1.01 mg/dL (ref 0.61–1.24)
GFR, Estimated: >60 mL/min (ref >60)
Glucose, Bld: 169 mg/dL — ABNORMAL HIGH (ref 70–99)
Potassium: 3.1 mmol/L — ABNORMAL LOW (ref 3.5–5.1)
Sodium: 138 mmol/L (ref 135–145)
Total Bilirubin: 0.9 mg/dL (ref 0.3–1.2)
Total Protein: 6.6 g/dL (ref 6.5–8.1)

## 2021-09-15 LAB — POCT I-STAT 7, (LYTES, BLD GAS, ICA,H+H)
Acid-Base Excess: 0 mmol/L (ref 0.0–2.0)
Bicarbonate: 24.5 mmol/L (ref 20.0–28.0)
Calcium, Ion: 1.14 mmol/L — ABNORMAL LOW (ref 1.15–1.40)
HCT: 46 % (ref 39.0–52.0)
Hemoglobin: 15.6 g/dL (ref 13.0–17.0)
O2 Saturation: 98 %
Patient temperature: 98.6
Potassium: 3.5 mmol/L (ref 3.5–5.1)
Sodium: 138 mmol/L (ref 135–145)
TCO2: 26 mmol/L (ref 22–32)
pCO2 arterial: 38.8 mmHg (ref 32–48)
pH, Arterial: 7.409 (ref 7.35–7.45)
pO2, Arterial: 100 mmHg (ref 83–108)

## 2021-09-15 LAB — RAPID URINE DRUG SCREEN, HOSP PERFORMED
Amphetamines: NOT DETECTED
Barbiturates: NOT DETECTED
Benzodiazepines: NOT DETECTED
Cocaine: NOT DETECTED
Opiates: NOT DETECTED
Tetrahydrocannabinol: NOT DETECTED

## 2021-09-15 LAB — URINALYSIS, ROUTINE W REFLEX MICROSCOPIC
Bilirubin Urine: NEGATIVE
Glucose, UA: NEGATIVE mg/dL
Hgb urine dipstick: NEGATIVE
Ketones, ur: NEGATIVE mg/dL
Leukocytes,Ua: NEGATIVE
Nitrite: NEGATIVE
Protein, ur: NEGATIVE mg/dL
Specific Gravity, Urine: 1.03 (ref 1.005–1.030)
pH: 6 (ref 5.0–8.0)

## 2021-09-15 LAB — PROTIME-INR
INR: 1 (ref 0.8–1.2)
Prothrombin Time: 13.3 seconds (ref 11.4–15.2)

## 2021-09-15 LAB — ETHANOL: Alcohol, Ethyl (B): <10 mg/dL (ref <10)

## 2021-09-15 LAB — TSH: TSH: 0.99 u[IU]/mL (ref 0.350–4.500)

## 2021-09-15 LAB — HEMOGLOBIN A1C
Hgb A1c MFr Bld: 5.4 % (ref 4.8–5.6)
Mean Plasma Glucose: 108.28 mg/dL

## 2021-09-15 SURGERY — IR WITH ANESTHESIA
Anesthesia: General

## 2021-09-15 MED ORDER — SODIUM CHLORIDE 0.9 % IV SOLN
1.0000 g | INTRAVENOUS | Status: DC
  Administered 2021-09-15 – 2021-09-17 (×3): 1 g via INTRAVENOUS
  Filled 2021-09-15 (×3): qty 10

## 2021-09-15 MED ORDER — TICAGRELOR 90 MG PO TABS
ORAL_TABLET | ORAL | Status: AC
  Filled 2021-09-15: qty 2

## 2021-09-15 MED ORDER — VERAPAMIL HCL 2.5 MG/ML IV SOLN
INTRAVENOUS | Status: AC
Start: 2021-09-15 — End: 2021-09-15
  Filled 2021-09-15: qty 2

## 2021-09-15 MED ORDER — STROKE: EARLY STAGES OF RECOVERY BOOK
Freq: Once | Status: DC
  Filled 2021-09-15: qty 1

## 2021-09-15 MED ORDER — ACETAMINOPHEN 160 MG/5ML PO SOLN: 650.0000 mg | ORAL | Status: DC | PRN

## 2021-09-15 MED ORDER — PROPOFOL 1000 MG/100ML IV EMUL
0.0000 ug/kg/min | INTRAVENOUS | Status: DC
  Administered 2021-09-15: 50 ug/kg/min via INTRAVENOUS
  Filled 2021-09-15: qty 100

## 2021-09-15 MED ORDER — IOHEXOL 350 MG/ML SOLN
100.0000 mL | Freq: Once | INTRAVENOUS | Status: AC | PRN
  Administered 2021-09-15: 100 mL via INTRAVENOUS

## 2021-09-15 MED ORDER — DOCUSATE SODIUM 50 MG/5ML PO LIQD: 100.0000 mg | Freq: Two times a day (BID) | ORAL | Status: DC

## 2021-09-15 MED ORDER — ACETAMINOPHEN 650 MG RE SUPP: 650.0000 mg | RECTAL | Status: DC | PRN

## 2021-09-15 MED ORDER — SODIUM CHLORIDE 0.9 % IV SOLN: INTRAVENOUS | Status: DC

## 2021-09-15 MED ORDER — PROPOFOL 10 MG/ML IV BOLUS
INTRAVENOUS | Status: DC | PRN
  Administered 2021-09-15: 200 mg via INTRAVENOUS

## 2021-09-15 MED ORDER — ACETAMINOPHEN 325 MG PO TABS: 650.0000 mg | ORAL_TABLET | ORAL | Status: DC | PRN

## 2021-09-15 MED ORDER — ASPIRIN 81 MG PO CHEW: 81.0000 mg | CHEWABLE_TABLET | Freq: Every day | ORAL | Status: DC

## 2021-09-15 MED ORDER — SENNOSIDES-DOCUSATE SODIUM 8.6-50 MG PO TABS: 1.0000 | ORAL_TABLET | Freq: Every evening | ORAL | Status: DC | PRN

## 2021-09-15 MED ORDER — ASPIRIN 81 MG PO CHEW
CHEWABLE_TABLET | ORAL | Status: AC
  Filled 2021-09-15: qty 1

## 2021-09-15 MED ORDER — NITROGLYCERIN 1 MG/10 ML FOR IR/CATH LAB
INTRA_ARTERIAL | Status: AC
  Filled 2021-09-15: qty 10

## 2021-09-15 MED ORDER — CLEVIDIPINE BUTYRATE 0.5 MG/ML IV EMUL
0.0000 mg/h | INTRAVENOUS | Status: DC
  Filled 2021-09-15: qty 50

## 2021-09-15 MED ORDER — FENTANYL CITRATE PF 50 MCG/ML IJ SOSY
50.0000 ug | PREFILLED_SYRINGE | INTRAMUSCULAR | Status: DC | PRN
  Administered 2021-09-15: 100 ug via INTRAVENOUS
  Filled 2021-09-15: qty 2

## 2021-09-15 MED ORDER — CLEVIDIPINE BUTYRATE 0.5 MG/ML IV EMUL: 0.0000 mg/h | INTRAVENOUS | Status: DC

## 2021-09-15 MED ORDER — PANTOPRAZOLE SODIUM 40 MG PO TBEC
40.0000 mg | DELAYED_RELEASE_TABLET | Freq: Every day | ORAL | Status: DC
Start: 2021-09-15 — End: 2021-09-18
  Administered 2021-09-15 – 2021-09-17 (×3): 40 mg via ORAL
  Filled 2021-09-15 (×3): qty 1

## 2021-09-15 MED ORDER — IOHEXOL 350 MG/ML SOLN
40.0000 mL | Freq: Once | INTRAVENOUS | Status: AC | PRN
  Administered 2021-09-15: 40 mL via INTRAVENOUS

## 2021-09-15 MED ORDER — PANTOPRAZOLE SODIUM 40 MG IV SOLR
40.0000 mg | Freq: Every day | INTRAVENOUS | Status: DC
  Filled 2021-09-15: qty 10

## 2021-09-15 MED ORDER — ROCURONIUM BROMIDE 100 MG/10ML IV SOLN
INTRAVENOUS | Status: DC | PRN
  Administered 2021-09-15: 60 mg via INTRAVENOUS
  Administered 2021-09-15: 20 mg via INTRAVENOUS

## 2021-09-15 MED ORDER — ASPIRIN 81 MG PO CHEW
81.0000 mg | CHEWABLE_TABLET | Freq: Every day | ORAL | Status: DC
Start: 2021-09-15 — End: 2021-09-15
  Administered 2021-09-15: 81 mg
  Filled 2021-09-15: qty 1

## 2021-09-15 MED ORDER — SODIUM CHLORIDE 0.9 % IV SOLN: INTRAVENOUS | Status: DC | PRN

## 2021-09-15 MED ORDER — SODIUM CHLORIDE 0.9 % IV BOLUS
1000.0000 mL | Freq: Once | INTRAVENOUS | Status: AC
Start: 2021-09-15 — End: 2021-09-15
  Administered 2021-09-15: 1000 mL via INTRAVENOUS

## 2021-09-15 MED ORDER — CHLORHEXIDINE GLUCONATE 0.12% ORAL RINSE (MEDLINE KIT)
15.0000 mL | Freq: Two times a day (BID) | OROMUCOSAL | Status: DC
  Administered 2021-09-15: 15 mL via OROMUCOSAL

## 2021-09-15 MED ORDER — PHENYLEPHRINE HCL (PRESSORS) 10 MG/ML IV SOLN
INTRAVENOUS | Status: DC | PRN
  Administered 2021-09-15 (×2): 40 ug via INTRAVENOUS

## 2021-09-15 MED ORDER — CEFAZOLIN SODIUM-DEXTROSE 2-3 GM-%(50ML) IV SOLR
INTRAVENOUS | Status: DC | PRN
  Administered 2021-09-15: 2 g via INTRAVENOUS

## 2021-09-15 MED ORDER — CHLORHEXIDINE GLUCONATE CLOTH 2 % EX PADS
6.0000 | MEDICATED_PAD | Freq: Every day | CUTANEOUS | Status: DC
  Administered 2021-09-15 – 2021-09-17 (×3): 6 via TOPICAL

## 2021-09-15 MED ORDER — HEPARIN SODIUM (PORCINE) 1000 UNIT/ML IJ SOLN
INTRAMUSCULAR | Status: DC | PRN
  Administered 2021-09-15: 2000 [IU] via INTRAVENOUS

## 2021-09-15 MED ORDER — ASPIRIN 81 MG PO CHEW
81.0000 mg | CHEWABLE_TABLET | Freq: Every day | ORAL | Status: DC
  Administered 2021-09-16 – 2021-09-17 (×2): 81 mg via ORAL
  Filled 2021-09-15 (×2): qty 1

## 2021-09-15 MED ORDER — POLYETHYLENE GLYCOL 3350 17 G PO PACK: 17.0000 g | PACK | Freq: Every day | ORAL | Status: DC

## 2021-09-15 MED ORDER — IOHEXOL 300 MG/ML  SOLN
100.0000 mL | Freq: Once | INTRAMUSCULAR | Status: AC | PRN
  Administered 2021-09-15: 65 mL via INTRA_ARTERIAL

## 2021-09-15 MED ORDER — ATORVASTATIN CALCIUM 80 MG PO TABS
80.0000 mg | ORAL_TABLET | Freq: Every day | ORAL | Status: DC
Start: 2021-09-15 — End: 2021-09-18
  Administered 2021-09-15 – 2021-09-17 (×3): 80 mg via ORAL
  Filled 2021-09-15 (×3): qty 1

## 2021-09-15 MED ORDER — TENECTEPLASE FOR STROKE
0.2500 mg/kg | PACK | Freq: Once | INTRAVENOUS | Status: AC
  Administered 2021-09-15: 23 mg via INTRAVENOUS
  Filled 2021-09-15: qty 10

## 2021-09-15 MED ORDER — ACETAMINOPHEN 325 MG PO TABS
650.0000 mg | ORAL_TABLET | ORAL | Status: DC | PRN
  Administered 2021-09-17: 650 mg via ORAL
  Filled 2021-09-15: qty 2

## 2021-09-15 MED ORDER — FENTANYL CITRATE (PF) 250 MCG/5ML IJ SOLN
INTRAMUSCULAR | Status: DC | PRN
  Administered 2021-09-15: 100 ug via INTRAVENOUS

## 2021-09-15 MED ORDER — IOHEXOL 300 MG/ML  SOLN
100.0000 mL | Freq: Once | INTRAMUSCULAR | Status: AC | PRN
  Administered 2021-09-15: 80 mL via INTRA_ARTERIAL

## 2021-09-15 MED ORDER — FENTANYL CITRATE PF 50 MCG/ML IJ SOSY: 50.0000 ug | PREFILLED_SYRINGE | INTRAMUSCULAR | Status: DC | PRN

## 2021-09-15 MED ORDER — FENTANYL CITRATE (PF) 100 MCG/2ML IJ SOLN
INTRAMUSCULAR | Status: AC
  Filled 2021-09-15: qty 2

## 2021-09-15 MED ORDER — PHENYLEPHRINE HCL-NACL 20-0.9 MG/250ML-% IV SOLN
INTRAVENOUS | Status: DC | PRN
  Administered 2021-09-15: 60 ug/min via INTRAVENOUS

## 2021-09-15 MED ORDER — ORAL CARE MOUTH RINSE
15.0000 mL | OROMUCOSAL | Status: DC
  Administered 2021-09-15: 15 mL via OROMUCOSAL

## 2021-09-15 MED ORDER — SUCCINYLCHOLINE CHLORIDE 200 MG/10ML IV SOSY
PREFILLED_SYRINGE | INTRAVENOUS | Status: DC | PRN
  Administered 2021-09-15: 120 mg via INTRAVENOUS

## 2021-09-15 MED ORDER — CEFAZOLIN SODIUM-DEXTROSE 2-4 GM/100ML-% IV SOLN
INTRAVENOUS | Status: AC
Start: 2021-09-15 — End: 2021-09-15
  Filled 2021-09-15: qty 100

## 2021-09-15 MED ORDER — CLEVIDIPINE BUTYRATE 0.5 MG/ML IV EMUL
INTRAVENOUS | Status: AC
  Filled 2021-09-15: qty 50

## 2021-09-15 NOTE — Progress Notes (Signed)
No clot noted, dissection,  coiling  ? ?

## 2021-09-15 NOTE — Progress Notes (Signed)
Pt remaining intubated ?

## 2021-09-15 NOTE — Procedures (Signed)
INR. ?4 Vessek cerebral arteriogram ?RT CFA approach. ?Findings. ?1.  Dissested RT VA at C1-C2. ? ?2.  Occluded right vertebrobasilar junction.. ? ?Status post endovascular therapeutic occlusion of the right vertebral artery at V3 V4 with coiling. ?Postprocedural CT of the brain negative for hemorrhage. ? ?6 French Angio-Seal closure device for hemostasis of the right groin puncture site.  Distal pulses present.  Patient left intubated because of the patient's preprocedural neurological condition. ? ?Fatima Sanger MD. ?

## 2021-09-15 NOTE — ED Notes (Signed)
EKG done, unable to give to ED provider at this time. All ED provider with critical care pt's. ?

## 2021-09-15 NOTE — Progress Notes (Signed)
OT Cancellation Note ? ?Patient Details ?Name: David Shields ?MRN: [id number] ?DOB: May 03, 1991 ? ? ?Cancelled Treatment:    Reason Eval/Treat Not Completed: Medical issues which prohibited therapy. Active bedrest, continue efforts as appropriate. ? ?Shaketha Jeon D Cadarius Nevares ?09/15/2021, 9:25 AM ?

## 2021-09-15 NOTE — Consult Note (Signed)
? ?NAME:  David Shields, MRN:  [id number], DOB:  1991-05-28, LOS: 0 ?ADMISSION DATE:  09/14/2021, CONSULTATION DATE:  09/15/21 ?REFERRING MD:  Estanislado Pandy, CHIEF COMPLAINT:  acute stroke  ? ?History of Present Illness:  ?David Shields is a 31 y.o. M with PMH only of anxiety who went to bed around 2100 3/17, woke up an hour later and fell and David Shields was noted to have ataxia, dysconjugate gazse and L gaze preference, headache and vomiting.  David Shields was a code stroke on arrival to the ED and did receive TNK after discussion with family.   CTH was initially normal, CTA showed Abnormal appearance of the right vertebral artery at the V2 V3 ?junction with return of opacification at the V4 segment. Stat MRI showed Punctate areas of acute ischemia in the right cerebellar hemisphere and dorsal right medulla oblongata.  David Shields was then taken to IR for emergent diagnostic angiogram and possible intervention, this found dissected r vertebral artery and was treated with coiling at V3/V4. Repeat head CT did not show any hemorrhage.  Pt was increasingly somnolent with dysphagia prior to the procedure, so the decision was made not to extubate overnight and PCCM was consulted.  Telemetry shows Afib, pt has no history of this per family. ? ? ?Pertinent  Medical History  ? has a past medical history of Anxiety. ? ? ?Significant Hospital Events: ?Including procedures, antibiotic start and stop dates in addition to other pertinent events   ?3/18 Code stroke, TNK, to IR for coiling of R vertebral artery dissection, left intubated and PCCM consult.  Neurology concerned about aspiration, Ceftriaxone initiated ? ?Interim History / Subjective:  ?Pt to ICU on Cleviprex ? ?Objective   ?Blood pressure (!) 123/104, pulse (!) 103, temperature 97.7 ?F (36.5 ?C), temperature source Oral, resp. rate (!) 27, height 6' (1.829 m), weight 93 kg, SpO2 97 %. ?   ?Vent Mode: PRVC ?FiO2 (%):  [50 %] 50 % ?Set Rate:  [14 bmp] 14 bmp ?Vt Set:  [620 mL] 620 mL ?PEEP:  [5  cmH20] 5 cmH20 ?Plateau Pressure:  [16 cmH20] 16 cmH20  ? ?Intake/Output Summary (Last 24 hours) at 09/15/2021 0551 ?Last data filed at 09/15/2021 0500 ?Gross per 24 hour  ?Intake 550 ml  ?Output 100 ml  ?Net 450 ml  ? ?Filed Weights  ? 09/14/21 2300  ?Weight: 93 kg  ? ? ?General:  well nourished M, critically ill intubated and sedated ?HEENT: MM pink/moist, ETT in place ?Neuro: examined on propofol, opens eyes to stimulation, moving all extremities spontaneously, triggering breaths on vent ?CV: s1s2 tachycardic, irregular, no m/r/g ?PULM:  mechanical vent sounds bilaterally without rhonchi or wheezing, FiO2 50%, PEEP 5 ?GI: soft, bsx4 active  ?Extremities: warm/dry, no edema  ?Skin: no rashes or lesions ? ? ?Resolved Hospital Problem list   ? ? ?Assessment & Plan:  ? 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, R vertebral artery dissection ?Expected Post-op ventilator management ?S/p IR endovascular therapeutic occlusion of the right vertebral artery at V3 V4 with coiling ?Left intubated secondary to worsening AMS and difficulty swallowing prior to procedure, Neurology concerned pt may have aspirated ?No hx of connective tissue disorder ?-Post-stroke care per neurology, neuro checks lipid panel, A1c, Echo ?-start Asa in 24hrs if repeat CTH does now show hemorrhage ?-SBP goal 120-140, currently on Cleviprex ?-CXR, ABG and start Ceftriaxone for possible aspiration ?--Maintain full vent support with SAT/SBT as tolerated ?-titrate Vent setting to maintain SpO2 greater than or equal to 90%. ?-HOB  elevated 30 degrees. ?-Plateau pressures less than 30 cm H20.  ?-Follow chest x-ray, ABG prn.   ?-Bronchial hygiene and RT/bronchodilator protocol. ? ? ?New onset Atrial Fibrillation ?Wife reports no mention of palpitations ?-HR currently controlled without medication ?-echo ?-TSH ?-monitor on Telemetry ?-no anti-coagulation post IR ? ?Best Practice (right click and "Reselect all SmartList Selections" daily)  ? ?Diet/type: NPO w/  oral meds ?DVT prophylaxis: SCD ?GI prophylaxis: PPI ?Lines: N/A ?Foley:  Yes, and it is still needed ?Code Status:  full code ?Last date of multidisciplinary goals of care discussion [per neurology] ? ?Labs   ?CBC: ?Recent Labs  ?Lab 09/14/21 ?0002 09/15/21 ?0009  ?WBC 15.2*  --   ?NEUTROABS 6.1  --   ?HGB 14.4 15.0  ?HCT 43.7 44.0  ?MCV 86.0  --   ?PLT 211  --   ? ? ?Basic Metabolic Panel: ?Recent Labs  ?Lab 09/14/21 ?0002 09/15/21 ?0009  ?NA 138 139  ?K 3.1* 3.1*  ?CL 103 101  ?CO2 25  --   ?GLUCOSE 169* 166*  ?BUN 12 14  ?CREATININE 1.01 0.90  ?CALCIUM 8.5*  --   ? ?GFR: ?Estimated Creatinine Clearance: 132.9 mL/min (by C-G formula based on SCr of 0.9 mg/dL). ?Recent Labs  ?Lab 09/14/21 ?0002  ?WBC 15.2*  ? ? ?Liver Function Tests: ?Recent Labs  ?Lab 09/14/21 ?0002  ?AST 56*  ?ALT 54*  ?ALKPHOS 67  ?BILITOT 0.9  ?PROT 6.6  ?ALBUMIN 3.8  ? ?No results for input(s): LIPASE, AMYLASE in the last 168 hours. ?No results for input(s): AMMONIA in the last 168 hours. ? ?ABG ?   ?Component Value Date/Time  ? TCO2 27 09/15/2021 0009  ?  ? ?Coagulation Profile: ?Recent Labs  ?Lab 09/14/21 ?0002  ?INR 1.0  ? ? ?Cardiac Enzymes: ?No results for input(s): CKTOTAL, CKMB, CKMBINDEX, TROPONINI in the last 168 hours. ? ?HbA1C: ?No results found for: HGBA1C ? ?CBG: ?No results for input(s): GLUCAP in the last 168 hours. ? ?Review of Systems:   ?Unable to obtain ? ?Past Medical History:  ?David Shields,  has a past medical history of Anxiety.  ? ?Surgical History:  ? ?Past Surgical History:  ?Procedure Laterality Date  ? ADENOIDECTOMY    ?  ? ?Social History:  ? reports that David Shields has quit smoking. His smoking use included cigarettes. David Shields smoked an average of .5 packs per day. David Shields has never used smokeless tobacco. David Shields reports current alcohol use. David Shields reports that David Shields does not use drugs.  ? ?Family History:  ?His family history includes Cancer in his maternal grandmother and paternal uncle; Depression in his mother; Hypertension in his maternal  grandmother.  ? ?Allergies ?Allergies  ?Allergen Reactions  ? Banana Hives, Shortness Of Breath and Itching  ?  ? ?Home Medications  ?Prior to Admission medications   ?Medication Sig Start Date End Date Taking? Authorizing Provider  ?ondansetron (ZOFRAN) 4 MG tablet Take 1 tablet (4 mg total) by mouth every 8 (eight) hours as needed for nausea or vomiting. ?Patient not taking: Reported on 03/04/2015 02/28/15   Francine Graven, DO  ?ondansetron (ZOFRAN-ODT) 8 MG disintegrating tablet Take 1 tablet (8 mg total) by mouth every 8 (eight) hours as needed for nausea or vomiting. ?Patient not taking: Reported on 09/15/2021 03/02/15   Davonna Belling, MD  ?promethazine (PHENERGAN) 25 MG suppository Place 1 suppository (25 mg total) rectally every 6 (six) hours as needed for nausea or vomiting. ?Patient not taking: Reported on 09/15/2021 03/04/15   Orpah Greek,  MD  ?promethazine (PHENERGAN) 25 MG tablet Take 1 tablet (25 mg total) by mouth every 6 (six) hours as needed for nausea. ?Patient not taking: Reported on 09/15/2021 03/02/15   Davonna Belling, MD  ?ranitidine (ZANTAC) 150 MG tablet Take 1 tablet (150 mg total) by mouth 2 (two) times daily. ?Patient not taking: Reported on 09/15/2021 03/04/15   Orpah Greek, MD  ?  ? ?Critical care time: 35 minutes ?  ? ? ?CRITICAL CARE ?Performed by: Otilio Carpen Summers Buendia ? ? ?Total critical care time: 35 minutes ? ?Critical care time was exclusive of separately billable procedures and treating other patients. ? ?Critical care was necessary to treat or prevent imminent or life-threatening deterioration. ? ?Critical care was time spent personally by me on the following activities: development of treatment plan with patient and/or surrogate as well as nursing, discussions with consultants, evaluation of patient's response to treatment, examination of patient, obtaining history from patient or surrogate, ordering and performing treatments and interventions, ordering and review of  laboratory studies, ordering and review of radiographic studies, pulse oximetry and re-evaluation of patient's condition. ? ?Otilio Carpen Lucee Brissett, PA-C ?[facility] ?See Amion for pager ?If no r

## 2021-09-15 NOTE — Progress Notes (Signed)
Referring Physician(s): Dr. 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  Subjective:  Pt resting in bed. He has been extubated. Pt denies headache or blurred vision. He states he can feels his hands again.   Allergies: Banana  Medications: Prior to Admission medications   Medication Sig Start Date End Date Taking? Authorizing Provider  ondansetron (ZOFRAN) 4 MG tablet Take 1 tablet (4 mg total) by mouth every 8 (eight) hours as needed for nausea or vomiting. Patient not taking: Reported on 03/04/2015 02/28/15   Samuel Jester, DO  ondansetron (ZOFRAN-ODT) 8 MG disintegrating tablet Take 1 tablet (8 mg total) by mouth every 8 (eight) hours as needed for nausea or vomiting. Patient not taking: Reported on 09/15/2021 03/02/15   Benjiman Core, MD  promethazine (PHENERGAN) 25 MG suppository Place 1 suppository (25 mg total) rectally every 6 (six) hours as needed for nausea or vomiting. Patient not taking: Reported on 09/15/2021 03/04/15   Gilda Crease, MD  promethazine (PHENERGAN) 25 MG tablet Take 1 tablet (25 mg total) by mouth every 6 (six) hours as needed for nausea. Patient not taking: Reported on 09/15/2021 03/02/15   Benjiman Core, MD  ranitidine (ZANTAC) 150 MG tablet Take 1 tablet (150 mg total) by mouth 2 (two) times daily. Patient not taking: Reported on 09/15/2021 03/04/15   Gilda Crease, MD     Vital Signs: BP (!) 110/93   Pulse 98   Temp (!) 96.2 F (35.7 C) (Axillary) Comment: Reported to the nurse, warm blankets applied  Resp 17   Ht 6' (1.829 m)   Wt 205 lb (93 kg)   SpO2 100%   BMI 27.80 kg/m   Physical Exam Constitutional:      Appearance: Normal appearance. He is not ill-appearing.  HENT:     Head: Normocephalic and atraumatic.  Eyes:      Extraocular Movements: Extraocular movements intact.     Pupils: Pupils are equal, round, and reactive to light.  Cardiovascular:     Rate and Rhythm: Normal rate and regular rhythm.     Pulses: Normal pulses.  Pulmonary:     Effort: Pulmonary effort is normal. No respiratory distress.  Skin:    General: Skin is warm and dry.     Comments: Site is soft with no active bleeding and no appreciable pseudoaneurysm. Dressing is C/D/I   Neurological:     Mental Status: He is alert and oriented to person, place, and time.     Comments: Alert, aware and oriented X 3 Speech and comprehension is intact.  PERRLA bilaterally No facial droop noted Tongue midline Can spontaneously move all 4 extremities.  Fine motor and coordination slow but intact.   Negative pronator drift. Gait not assessed Romberg not assessed Heel to toe not assessed Distal pulses not assessed     Psychiatric:        Mood and Affect: Mood normal.        Behavior: Behavior normal.        Thought Content: Thought content normal.        Judgment: Judgment normal.    Imaging: CT HEAD WO CONTRAST ( )  Result Date: 09/15/2021 CLINICAL DATA:  Neuro deficit, acute, stroke suspected. Mental status change. EXAM: CT HEAD WITHOUT CONTRAST TECHNIQUE: Contiguous axial images were obtained from the  base of the skull through the vertex without intravenous contrast. RADIATION DOSE REDUCTION: This exam was performed according to the departmental dose-optimization program which includes automated exposure control, adjustment of the mA and/or kV according to patient size and/or use of iterative reconstruction technique. COMPARISON:  09/14/2021. FINDINGS: Brain: No acute intracranial hemorrhage, midline shift or mass effect. No extra-axial fluid collection. There is hypodense region in the cerebellum on the left, which may be artifactual. Gray-white matter differentiation is within normal limits. No hydrocephalus. Vascular: No hyperdense  vessel or unexpected calcification. Skull: Normal. Negative for fracture or focal lesion. Sinuses/Orbits: Mucosal thickening is present in the maxillary sinus on the left. The orbits are unremarkable. Other: None. IMPRESSION: 1. No acute intracranial hemorrhage. 2. Hypodense region in the cerebellum on the left possible artifact, however infarct can not be excluded. MRI may be beneficial for further evaluation. Electronically Signed   By: Thornell Sartorius M.D.   On: 09/15/2021 02:24   MR BRAIN WO CONTRAST  Result Date: 09/15/2021 CLINICAL DATA:  Acute neurologic deficit EXAM: MRI HEAD WITHOUT CONTRAST TECHNIQUE: Multiplanar, multiecho pulse sequences of the brain and surrounding structures were obtained without intravenous contrast. COMPARISON:  None. FINDINGS: Only diffusion-weighted and susceptibility weighted imaging was obtained. This shows punctate areas of acute ischemia in the right cerebellar hemisphere, as well as an area of ischemia in the dorsal right medulla oblongata. No acute or chronic hemorrhage. IMPRESSION: Punctate areas of acute ischemia in the right cerebellar hemisphere and dorsal right medulla oblongata. Electronically Signed   By: Deatra Robinson M.D.   On: 09/15/2021 01:21   CT CEREBRAL PERFUSION W CONTRAST  Result Date: 09/15/2021 CLINICAL DATA:  Acute neurologic deficit EXAM: CT PERFUSION BRAIN TECHNIQUE: Multiphase CT imaging of the brain was performed following IV bolus contrast injection. Subsequent parametric perfusion maps were calculated using RAPID software. RADIATION DOSE REDUCTION: This exam was performed according to the departmental dose-optimization program which includes automated exposure control, adjustment of the mA and/or kV according to patient size and/or use of iterative reconstruction technique. CONTRAST:  40mL OMNIPAQUE IOHEXOL 350 MG/ML SOLN, OMNIPAQUE IOHEXOL 350 MG/ML SOLN COMPARISON:  None. FINDINGS: CT Brain Perfusion Findings: CBF (<30%) Volume: 0mL  Perfusion (Tmax>6.0s) volume: Mismatch Volume: ASPECTS on noncontrast CT Head: 10 at 12 o'clock a.m. today. Infarct Core: 0 mL Infarction Location:None The calculated area of elevated Tmax is suspected to be artifactual on the basis of motion. The time attenuation curves are abnormal, also suggesting that the results are unreliable. IMPRESSION: Motion degraded exam with unreliable results. Electronically Signed   By: Deatra Robinson M.D.   On: 09/15/2021 00:41   DG CHEST PORT 1 VIEW  Result Date: 09/15/2021 CLINICAL DATA:  ETT placement; acute ischemic multifocal posterior circulation stroke involving right sided vessel EXAM: PORTABLE CHEST 1 VIEW COMPARISON:  01/16/2011. FINDINGS: Endotracheal tube in good position with tip at the level of the clavicular heads. Gastric tube courses below the diaphragm and is looped in the stomach with tip outside the field of view. Mild patient rotation. Clear lungs. No visible pleural effusions or pneumothorax on this semi erect radiograph. Cardiomediastinal silhouette is within normal limits. IMPRESSION: 1. Endotracheal tube in good position with tip at the level of the clavicular heads. 2. No evidence of acute cardiopulmonary disease. Electronically Signed   By: Feliberto Harts M.D.   On: 09/15/2021 08:34   CT HEAD CODE STROKE WO CONTRAST  Result Date: 09/15/2021 CLINICAL DATA:  Code stroke.  Right-sided gaze and slurred  speech EXAM: CT HEAD WITHOUT CONTRAST TECHNIQUE: Contiguous axial images were obtained from the base of the skull through the vertex without intravenous contrast. RADIATION DOSE REDUCTION: This exam was performed according to the departmental dose-optimization program which includes automated exposure control, adjustment of the mA and/or kV according to patient size and/or use of iterative reconstruction technique. COMPARISON:  None. FINDINGS: Brain: There is no mass, hemorrhage or extra-axial collection. The size and configuration of the  ventricles and extra-axial CSF spaces are normal. The brain parenchyma is normal, without evidence of acute or chronic infarction. Vascular: No abnormal hyperdensity of the major intracranial arteries or dural venous sinuses. No intracranial atherosclerosis. Skull: The visualized skull base, calvarium and extracranial soft tissues are normal. Sinuses/Orbits: No fluid levels or advanced mucosal thickening of the visualized paranasal sinuses. No mastoid or middle ear effusion. The orbits are normal. ASPECTS [facility] Stroke Program Early CT Score) - Ganglionic level infarction (caudate, lentiform nuclei, internal capsule, insula, M1-M3 cortex): 7 - Supraganglionic infarction (M4-M6 cortex): 3 Total score (0-10 with 10 being normal): 10 IMPRESSION: 1. Normal head CT. 2. ASPECTS is 10. 3. These results were communicated to Dr. Erick Blinks at 12:05 am on 09/15/2021 by text page via the [facility] messaging system. Electronically Signed   By: Deatra Robinson M.D.   On: 09/15/2021 00:05   CT ANGIO HEAD NECK W WO CM (CODE STROKE)  Result Date: 09/15/2021 CLINICAL DATA:  Multiple acute neurologic symptoms EXAM: CT ANGIOGRAPHY HEAD AND NECK TECHNIQUE: Multidetector CT imaging of the head and neck was performed using the standard protocol during bolus administration of intravenous contrast. Multiplanar CT image reconstructions and MIPs were obtained to evaluate the vascular anatomy. Carotid stenosis measurements (when applicable) are obtained utilizing NASCET criteria, using the distal internal carotid diameter as the denominator. RADIATION DOSE REDUCTION: This exam was performed according to the departmental dose-optimization program which includes automated exposure control, adjustment of the mA and/or kV according to patient size and/or use of iterative reconstruction technique. CONTRAST:  OMNIPAQUE IOHEXOL 350 MG/ML SOLN COMPARISON:  None. FINDINGS: CTA NECK FINDINGS SKELETON: There is no bony spinal canal stenosis.  No lytic or blastic lesion. OTHER NECK: Normal pharynx, larynx and major salivary glands. No cervical lymphadenopathy. Unremarkable thyroid gland. UPPER CHEST: Ground-glass opacity in the left upper lobe. AORTIC ARCH: There is no calcific atherosclerosis of the aortic arch. There is no aneurysm, dissection or hemodynamically significant stenosis of the visualized portion of the aorta. Conventional 3 vessel aortic branching pattern. The visualized proximal subclavian arteries are widely patent. RIGHT CAROTID SYSTEM: Normal without aneurysm, dissection or stenosis. LEFT CAROTID SYSTEM: Normal without aneurysm, dissection or stenosis. VERTEBRAL ARTERIES: Left dominant configuration. The right vertebral artery is diminutive. There is loss of opacification at the right V2 V3 junction, at the right C1 transverse foramen. The left vertebral artery is normal. CTA HEAD FINDINGS POSTERIOR CIRCULATION: --Vertebral arteries: Diminished opacification of the patent right V4 segment. Left V4 is normal. --Inferior cerebellar arteries: Normal. --Basilar artery: Normal. --Superior cerebellar arteries: Normal. --Posterior cerebral arteries (PCA): Normal. ANTERIOR CIRCULATION: --Intracranial internal carotid arteries: Normal. --Anterior cerebral arteries (ACA): Normal. Absent right A1 segment, normal variant --Middle cerebral arteries (MCA): Normal. VENOUS SINUSES: As permitted by contrast timing, patent. ANATOMIC VARIANTS: Fetal origins of both posterior cerebral arteries. Review of the MIP images confirms the above findings. IMPRESSION: 1. No intracranial arterial occlusion or high-grade stenosis. 2. Abnormal appearance of the right vertebral artery at the V2 V3 junction with return of opacification at the  V4 segment. This could indicate a vertebral dissection. 3. Ground-glass opacity in the left upper lobe, which may indicate aspiration. These results were called by telephone at the time of interpretation on 09/15/2021 at 12:27 am to  provider [facility] , who verbally acknowledged these results. Electronically Signed   By: Deatra Robinson M.D.   On: 09/15/2021 00:28    Labs:  CBC: Recent Labs    09/14/21 0002 09/15/21 0009 09/15/21 0602 09/15/21 0612  WBC 15.2*  --  11.7*  --   HGB 14.4 15.0 15.2 15.6  HCT 43.7 44.0 45.4 46.0  PLT 211  --  183  --     COAGS: Recent Labs    09/14/21 0002  INR 1.0  APTT 25    BMP: Recent Labs    09/14/21 0002 09/15/21 0009 09/15/21 0602 09/15/21 0612  NA 138 139 137 138  K 3.1* 3.1* 3.4* 3.5  CL 103 101 103  --   CO2 25  --  24  --   GLUCOSE 169* 166* 132*  --   BUN 12 14 8   --   CALCIUM 8.5*  --  8.8*  --   CREATININE 1.01 0.90 0.76  --   GFRNONAA >60  --  >60  --     LIVER FUNCTION TESTS: Recent Labs    09/14/21 0002  BILITOT 0.9  AST 56*  ALT 54*  ALKPHOS 67  PROT 6.6  ALBUMIN 3.8    Assessment and Plan: Alert, aware and oriented X 3 Speech and comprehension is intact.  PERRLA bilaterally No facial droop noted Tongue midline Can spontaneously move all 4 extremities.  Fine motor and coordination slow but intact.   R femoral puncture site is soft with no active bleeding and no     appreciable pseudoaneurysm. Dressing is C/D/I  Neuro/NIR to continue to follow.   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

## 2021-09-15 NOTE — Progress Notes (Signed)
Patient ID: David Shields, male   DOB: 02-01-1991, 31 y.o.   MRN: [id number] ?31 year old male modified Rankin score 0.  Last seen well 9 PM last night. ? ?New onset of a left gaze preference right-sided ataxia disconjugate gaze headache and vomiting. ? ?CT of the brain no intracranial hemorrhage.  Aspects 10 ? ?CTA of the brain severe stenosis/irregularity of the right vertebral artery at V3 V4 suggesting dissection, ? ?CT perfusion scan unremarkable.  MRI revealing right cerebellar focal diffusion-weighted hyperintensities in addition t DWI restriction involving the area of the right lateral medulla. ? ?Due to worsening dysarthria, and somnolence, diagnostic cath arteriogram with possible intent to treat discussed with the patient's spouse and the patient in a three-way call. ? ?The procedure, the risks and benefits and alternatives were reviewed. ? ?The risks of intracranial hemorrhage of 10% worsening neurological condition, death and inability through revascularize reviewed.  Spells expressed understanding and agreed to provide consent for the procedure. ? ?Arlean Hopping MD ?

## 2021-09-15 NOTE — Progress Notes (Signed)
?  Transition of Care (TOC) Screening Note ? ? ?Patient Details  ?Name: David Shields ?Date of Birth: 25-Sep-1990 ? ? ?Transition of Care (TOC) CM/SW Contact:    ?Windle Guard, LCSW ?Phone Number: ?09/15/2021, 8:37 AM ? ? ? ?Transition of Care Department [facility]) has reviewed patient and noted no immediate TOC needs pending continued medical work-up. TOC team will continue to monitor patient advancement through interdisciplinary progression rounds to support any identified discharge supports as needed. If new patient transition needs arise, please place a TOC consult or reach out to [facility] team.  ?  ?

## 2021-09-15 NOTE — H&P (Signed)
NEUROLOGY CONSULTATION NOTE  ? ?Date of service: September 15, 2021 ?Patient Name: David Shields ?MRN:  [id number] ?DOB:  Jun 13, 1991 ?_ _ _   _ __   _ __ _ _  __ __   _ __   __ _ ? ?History of Present Illness  ?David Shields is a 31 y.o. male with PMH significant for anxiety who went to bed at 2100 and woke up an hour later, attempted to get out of bed and fell immediately. He seemed off so family called 911. He was noted to have a L gaze preference, R sided ataxia, dysconjugate gaze, headache and vomited. He was brought in to the ED as code stroke. ? ?On arrival, symptoms were persistent. CTH w/o contrast negative for a large hypodensity concerning for a large territory infarct or hyperdensity concerning for an ICH. He was offered tNKASE but initially hesitant and not comfortable with the associated risk of ICH but wanted to wait to discuss with his wife. He and wife eventually opted for tNKASE. ? ?CT angio head and neck with concern for possible R vert dissection at V2/3 with patent V4 and no basilar occlusion, bilateral PCA patent, R and L PICA appear patent. CT Perfusion inconclusive due to motion. ? ?STAT MRI Brain was obtained to clarify if the noted dissection resulted in any significant strokes. MRI Brain was notable for punctate stroke in the medulla and a punctate stroke in the R cerbellum. ? ?He had worsening of his deficit and developed somnolence, R facial droop, worsening ataxia and reported new numbness. A STAT CTH was negative for an ICH. Code IR was activated at this time to get emergent diagnostic angiogram and for potential intervention if needed. Dr. Estanislado Pandy discussed details, risks and benefits of intervention with patient and his wife inclding 10% risk of ICH and they consented to intervention and possible stenting. ? ?LKW: 2100 on 09/15/21 ?mRS: 0 ?tNKASE: yes offered. Discussed risks and benefits with patient and he was hesitant to make a decision without speaking with his wife. I spoke to  patient's wife and they were hesitant but eventually consented to tNKASE. ?Thrombectomy: emergent diagnostic angiogram and possible intervention was offered when his exam worsened and exam out of proportion to the MRI findings. ?NIHSS components Score: Comment  ?1a Level of Conscious 0[x]  1[]  2[]  3[]      ?1b LOC Questions 0[x]  1[]  2[]       ?1c LOC Commands 0[x]  1[]  2[]       ?2 Best Gaze 0[]  1[x]  2[]     L gaze preference.  ?3 Visual 0[x]  1[]  2[]  3[]      ?4 Facial Palsy 0[x]  1[]  2[]  3[]      ?5a Motor Arm - left 0[x]  1[]  2[]  3[]  4[]  UN[]    ?5b Motor Arm - Right 0[]  1[x]  2[]  3[]  4[]  UN[]    ?6a Motor Leg - Left 0[x]  1[]  2[]  3[]  4[]  UN[]    ?6b Motor Leg - Right 0[]  1[x]  2[]  3[]  4[]  UN[]    ?7 Limb Ataxia 0[]  1[]  2[x]  3[]  UN[]     ?8 Sensory 0[x]  1[]  2[]  UN[]      ?9 Best Language 0[x]  1[]  2[]  3[]      ?10 Dysarthria 0[]  1[x]  2[]  UN[]      ?11 Extinct. and Inattention 0[x]  1[]  2[]       ?TOTAL: 6   ?  ?ROS  ? ?Unable to obtain detailed ROS given the acuity of the situation. ? ?Past History  ? ?Past Medical History:  ?Diagnosis Date  ??  Anxiety   ? ?Past Surgical History:  ?Procedure Laterality Date  ?? ADENOIDECTOMY    ? ?Family History  ?Problem Relation Age of Onset  ?? Depression Mother   ?? Cancer Paternal Uncle   ?? Cancer Maternal Grandmother   ?     breast   ?? Hypertension Maternal Grandmother   ? ?Social History  ? ?Socioeconomic History  ?? Marital status: Single  ?  Spouse name: Not on file  ?? Number of children: Not on file  ?? Years of education: Not on file  ?? Highest education level: Not on file  ?Occupational History  ?? Not on file  ?Tobacco Use  ?? Smoking status: Former  ?  Packs/day: 0.50  ?  Types: Cigarettes  ?? Smokeless tobacco: Never  ?Vaping Use  ?? Vaping Use: Never used  ?Substance and Sexual Activity  ?? Alcohol use: Yes  ?  Comment: occasionally  ?? Drug use: No  ?? Sexual activity: Not on file  ?Other Topics Concern  ?? Not on file  ?Social History Narrative  ?? Not on file  ? ?Social  Determinants of Health  ? ?Financial Resource Strain: Not on file  ?Food Insecurity: Not on file  ?Transportation Needs: Not on file  ?Physical Activity: Not on file  ?Stress: Not on file  ?Social Connections: Not on file  ? ?Allergies  ?Allergen Reactions  ?? Banana Hives, Shortness Of Breath and Itching  ? ? ?Medications  ?(Not in a hospital admission) ?  ? ?Vitals  ? ?Vitals:  ? 09/15/21 0005 09/15/21 0140 09/15/21 0141 09/15/21 0145  ?BP: (!) 134/98 (!) 118/99 (!) 118/99 (!) 123/104  ?Pulse:  (!) 117 (!) 133 (!) 103  ?Resp:  20 (!) 29 (!) 27  ?Temp:   97.7 ?F (36.5 ?C)   ?TempSrc:   Oral   ?SpO2:  96% 94% 94%  ?Weight:      ?  ? ?Body mass index is 27.8 kg/m?. ? ?Physical Exam  ? ?General: Laying comfortably in bed; appears anxious. ?HENT: Normal oropharynx and mucosa. Normal external appearance of ears and nose.  ?Neck: Supple, no pain or tenderness  ?CV: No JVD. No peripheral edema.  ?Pulmonary: Symmetric Chest rise. Normal respiratory effort.  ?Abdomen: Soft to touch, non-tender.  ?Ext: No cyanosis, edema, or deformity  ?Skin: No rash. Normal palpation of skin.   ?Musculoskeletal: Normal digits and nails by inspection. No clubbing.  ? ?Neurologic Examination  ?Mental status/Cognition: Alert, oriented to self, place, month and year, good attention.  ?Speech/language: dysarthric speech, fluent, comprehension intact, object naming intact, repetition intact.  ?Cranial nerves:  ? CN II Pupils equal and reactive to light, dysconjugate gaze, no VF deficits.  ? CN III,IV,VI EOM intact, no gaze preference or deviation, persistent unidirectional R beating horizontal nystagmus.  ? CN V normal sensation in V1, V2, and V3 segments bilaterally  ? CN VII no asymmetry, no nasolabial fold flattening  ? CN VIII normal hearing to speech  ? CN IX & X normal palatal elevation, no uvular deviation  ? CN XI 5/5 head turn and 5/5 shoulder shrug bilaterally  ? CN XII midline tongue protrusion  ? ?Motor:  ?Muscle bulk: normal, tone  normal ?Mvmt Root Nerve  Muscle Right Left Comments  ?SA C5/6 Ax Deltoid 5 5   ?EF C5/6 Mc Biceps 5 5   ?EE C6/7/8 Rad Triceps 5 5   ?WF C6/7 Med FCR     ?WE C7/8 PIN ECU 5 5   ?  F Ab C8/T1 U ADM/FDI 5 5   ?HF L1/2/3 Fem Illopsoas 5 5   ?KE L2/3/4 Fem Quad 5 5   ?DF L4/5 D Peron Tib Ant 5 5   ?PF S1/2 Tibial Grc/Sol 5 5   ? ?Reflexes: ? Right Left Comments  ?Pectoralis     ? Biceps (C5/6) 2 2   ?Brachioradialis (C5/6) 2 2   ? Triceps (C6/7) 2 2   ? Patellar (L3/4) 2 2   ? Achilles (S1)     ? Hoffman     ? Plantar     ?Jaw jerk   ? ?Sensation: ? Light touch Intact throughout  ? Pin prick   ? Temperature   ? Vibration   ?Proprioception   ? ?Coordination/Complex Motor:  ?- Finger to Nose with significant ataxia BL, right worse than left. Ataxia is persistent even when he closes one eye. ?- Heel to shin with ataxia in RLE. ?- Rapid alternating movement are intact BL ?- Gait: Deferred. ? ?Labs  ? ?CBC:  ?Recent Labs  ?Lab 09/14/21 ?0002 09/15/21 ?0009  ?WBC 15.2*  --   ?NEUTROABS 6.1  --   ?HGB 14.4 15.0  ?HCT 43.7 44.0  ?MCV 86.0  --   ?PLT 211  --   ? ? ?Basic Metabolic Panel:  ?Lab Results  ?Component Value Date  ? NA 139 09/15/2021  ? K 3.1 (L) 09/15/2021  ? CO2 25 09/14/2021  ? GLUCOSE 166 (H) 09/15/2021  ? BUN 14 09/15/2021  ? CREATININE 0.90 09/15/2021  ? CALCIUM 8.5 (L) 09/14/2021  ? GFRNONAA >60 09/14/2021  ? GFRAA >60 02/28/2015  ? ?Lipid Panel: No results found for: Sharon ?HgbA1c: No results found for: HGBA1C ?Urine Drug Screen:  ?   ?Component Value Date/Time  ? LABOPIA NONE DETECTED 09/15/2021 0030  ? COCAINSCRNUR NONE DETECTED 09/15/2021 0030  ? LABBENZ NONE DETECTED 09/15/2021 0030  ? AMPHETMU NONE DETECTED 09/15/2021 0030  ? THCU NONE DETECTED 09/15/2021 0030  ? LABBARB NONE DETECTED 09/15/2021 0030  ?  ?Alcohol Level  ?   ?Component Value Date/Time  ? ETH <10 09/14/2021 0004  ? ? ?CT Head without contrast(Personally reviewed): ?CTH was negative for a large hypodensity concerning for a large territory  infarct or hyperdensity concerning for an ICH ? ?CT angio Head and Neck with contrast(Personally reviewed): ?R V2/V3 possible dissection with V4 appearing patent. Rest of the posterior circulation vessels appear patent.

## 2021-09-15 NOTE — Code Documentation (Signed)
Stroke Response Nurse Documentation ?Code Documentation ? ?MAXTYN AVENT is a 31 y.o. male arriving to [facility]  via [facility] on 3/18 with no past medical hx . On No antithrombotic. Code stroke was activated by EMS.  ? ?Patient from home where he was LKW at 2100 and now complaining of right sided weakness and right gaze preference.   ? ?Stroke team at the bedside on patient arrival. Labs drawn and patient cleared for CT by Dr. Sedonia Small. Patient to CT with team. NIHSS 10, see documentation for details and code stroke times. Patient with right gaze preference , right facial droop, right arm weakness, right leg weakness, bilateral limb ataxia, right decreased sensation, and dysarthria  on exam. The following imaging was completed:  CT Head, CTA, CTP, and MRI. Patient is a candidate for IV Thrombolytic due to fixed neurological deficit. Patient is not not a candidate for IR due to No LVO.  ? ?Care Plan: Cerebral Angiogram due to worsening clinical exam.  ? ?Bedside handoff with ED RN Jamelle Haring.   ? ?Madelynn Done  ?Rapid Response RN ?  ?

## 2021-09-15 NOTE — Evaluation (Signed)
Speech Language Pathology Evaluation ?Patient Details ?Name: David Shields ?MRN: [id number] ?DOB: 04-01-1991 ?Today's Date: 09/15/2021 ?Time: 9147-8295 ?SLP Time Calculation (min) (ACUTE ONLY): 13 min ? ?Problem List:  ?Patient Active Problem List  ? Diagnosis Date Noted  ? 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  ? Vertebral artery dissection (HCC)   ? Atrial fibrillation (HCC)   ? Tinea versicolor 02/01/2013  ? Insomnia 02/01/2013  ? Anxiety 09/19/2011  ? Panic attack 09/19/2011  ? Tobacco user 09/19/2011  ? ?Past Medical History:  ?Past Medical History:  ?Diagnosis Date  ? Anxiety   ? ?Past Surgical History:  ?Past Surgical History:  ?Procedure Laterality Date  ? ADENOIDECTOMY    ? ?HPI:  ?Mr. David Shields is a 31 y.o. male with history of anxiety presenting with right sided ataxia, right sided weakness, inability to walk, dysconjugate gaze, left gaze preference, headache and vomiting. He was given TNK to treat possible stroke, but symptoms worsened.  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  He was taken to IR and the right vertebral artery was coiled. MRI with Punctate areas of acute ischemia in the right cerebellar hemisphere  and dorsal right medulla oblongata.  ? ?Assessment / Plan / Recommendation ?Clinical Impression ? Patient presenting [facility] for all cognitive-linguistic tasks assessed. He is 100% intelligible at the conversation level however reports that he did have significant dysarthria at the onset of symptoms which have improved but that compared to baseline, subtle deficit remains. Given overall rapid improvement in symptoms since admission, suspect that this patient detectable subtle dysarthria will also resolve spontaneously. Outpatient SLP services can be an option if symptoms do not  resolve within a few weeks of discharge. Patient in agreement with plan. No acute f/u SLP services indicated. ?   ?SLP Assessment ? SLP Recommendation/Assessment: All further Speech Lanaguage Pathology  needs can be addressed in the next venue of care ?SLP Visit Diagnosis: Dysarthria and anarthria (R47.1)  ?  ?Recommendations for follow up therapy are one component of a multi-disciplinary discharge planning process, led by the attending physician.  Recommendations may be updated based on patient status, additional functional criteria and insurance authorization. ?   ?Follow Up Recommendations ? Outpatient SLP (if needed)  ?  ?   ?   ?   ?   ?SLP Evaluation ?Cognition ? Overall Cognitive Status: Within Functional Limits for tasks assessed  ?  ?   ?Comprehension ? Auditory Comprehension ?Overall Auditory Comprehension: Appears within functional limits for tasks assessed ?Visual Recognition/Discrimination ?Discrimination: Within Function Limits ?Reading Comprehension ?Reading Status: Within funtional limits  ?  ?Expression Expression ?Primary Mode of Expression: Verbal ?Verbal Expression ?Overall Verbal Expression: Appears within functional limits for tasks assessed   ?Oral / Motor ? 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        ?David Romig MA, CCC-SLP ? ?David Shields ?09/15/2021, 2:32 PM ? ?

## 2021-09-15 NOTE — Evaluation (Signed)
Clinical/Bedside Swallow Evaluation ?Patient Details  ?Name: David Shields ?MRN: [id number] ?Date of Birth: Aug 25, 1990 ? ?Today's Date: 09/15/2021 ?Time: SLP Start Time (ACUTE ONLY): [id number] SLP Stop Time (ACUTE ONLY): [id number] ?SLP Time Calculation (min) (ACUTE ONLY): 8 min ? ?Past Medical History:  ?Past Medical History:  ?Diagnosis Date  ? Anxiety   ? ?Past Surgical History:  ?Past Surgical History:  ?Procedure Laterality Date  ? ADENOIDECTOMY    ? ?HPI:  ?Mr. David Shields is a 31 y.o. male with history of anxiety presenting with right sided ataxia, right sided weakness, inability to walk, dysconjugate gaze, left gaze preference, headache and vomiting. He was given TNK to treat possible stroke, but symptoms worsened.  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  He was taken to IR and the right vertebral artery was coiled. MRI with Punctate areas of acute ischemia in the right cerebellar hemisphere  and dorsal right medulla oblongata.  ?  ?Assessment / Plan / Recommendation  ?Clinical Impression ? Patient presents with a functional and normal appearing oropharyngeal swallow. No overt s/s of aspiration evident. No SLP f/u indicated for swallow at this time. ?SLP Visit Diagnosis: Dysarthria and anarthria (R47.1);Dysphagia, unspecified (R13.10) ?   ?   ?Diet Recommendation Regular;Thin liquid  ? ?Liquid Administration via: Cup;Straw ?Medication Administration: Whole meds with liquid ?Supervision: Patient able to self feed ?Compensations: Slow rate;Small sips/bites ?Postural Changes: Seated upright at 90 degrees  ?  ?Other  Recommendations Oral Care Recommendations: Oral care BID   ? ?Recommendations for follow up therapy are one component of a multi-disciplinary discharge planning process, led by the attending physician.  Recommendations may be updated based on patient status, additional functional criteria and insurance  authorization. ? ?Follow up Recommendations Outpatient SLP (if needed for residual dysarthria)  ? ? ?  ?Assistance Recommended at Discharge None  ? ? ?Swallow Study   ?General HPI: Mr. David Shields is a 31 y.o. male with history of anxiety presenting with right sided ataxia, right sided weakness, inability to walk, dysconjugate gaze, left gaze preference, headache and vomiting. He was given TNK to treat possible stroke, but symptoms worsened.  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  He was taken to IR and the right vertebral artery was coiled. MRI with Punctate areas of acute ischemia in the right cerebellar hemisphere  and dorsal right medulla oblongata. ?Type of Study: Bedside Swallow Evaluation ?Previous Swallow Assessment: none ?Diet Prior to this Study: Regular;Thin liquids (Patient had just passed Gertie Fey however MD wants BSE with SLP given medullary involvement) ?Temperature Spikes Noted: No ?Respiratory Status: Room air ?History of Recent Intubation: Yes ?Length of Intubations (days):  (less than one day) ?Date extubated: 09/15/21 ?Behavior/Cognition: Alert;Cooperative;Pleasant mood ?Oral Cavity Assessment: Within Functional Limits ?Oral Care Completed by SLP: Recent completion by staff ?Oral Cavity - Dentition: Adequate natural dentition ?Vision: Functional for self-feeding ?Self-Feeding Abilities: Able to feed self ?Patient Positioning: Upright in bed ?Baseline Vocal Quality: Normal ?Volitional Cough: Strong ?Volitional Swallow: Able to elicit  ?  ?Oral/Motor/Sensory Function Overall Oral Motor/Sensory Function: Mild impairment ?Facial ROM: Within Functional Limits ?Facial Symmetry: Within Functional Limits ?Facial Strength: Within Functional Limits ?Facial Sensation: Within Functional Limits ?Lingual ROM: Within Functional Limits ?Lingual Symmetry: Within Functional Limits ?Lingual Strength: Reduced;Suspected CN XII  (hypoglossal) dysfunction (right, subtle) ?Lingual Sensation: Within Functional Limits ?Velum: Within Functional  Limits ?Mandible: Within Functional Limits   ?Ice Chips Ice chips: Not tested   ?Thin Liquid Thin Liquid: Within functional limits ?Presentation: Cup;Self Fed;Straw  ?  ?Nectar Thick Nectar Thick Liquid: Not tested   ?Honey Thick Honey Thick Liquid: Not tested   ?Puree Puree: Within functional limits ?Presentation: Spoon;Self Fed   ?Solid ? ? ?  Solid: Within functional limits ?Presentation: Self Fed  ? ?  ?David Duby MA, CCC-SLP ? ?Hillman Attig Meryl ?09/15/2021,2:36 PM ? ? ? ?

## 2021-09-15 NOTE — Progress Notes (Signed)
PT Cancellation Note ? ?Patient Details ?Name: TADARRIUS BURCH ?MRN: [id number] ?DOB: 12-06-1990 ? ? ?Cancelled Treatment:    Reason Eval/Treat Not Completed: Patient not medically ready;Active bedrest order. Will follow as appropriate.  ? ?Lyanne Co, PT  ?Acute Rehab Services ? Pager [phone] ?Office [phone] ? ? ? ?Rondel Episcopo L Marsheila Alejo ?09/15/2021, 8:13 AM ?

## 2021-09-15 NOTE — Anesthesia Preprocedure Evaluation (Signed)
Anesthesia Evaluation  ?Patient identified by MRN, date of birth, ID band ?Patient awake ? ? ? ?Reviewed: ?Allergy & Precautions, H&P , NPO status , Patient's Chart, lab work & pertinent test results ? ?Airway ?Mallampati: II ? ? ?Neck ROM: full ? ? ? Dental ?  ?Pulmonary ?former smoker,  ?  ?breath sounds clear to auscultation ? ? ? ? ? ? Cardiovascular ?+ dysrhythmias Atrial Fibrillation  ?Rhythm:irregular Rate:Tachycardia ? ? ?  ?Neuro/Psych ?PSYCHIATRIC DISORDERS Anxiety Code stroke ?CVA   ? GI/Hepatic ?  ?Endo/Other  ? ? Renal/GU ?  ? ?  ?Musculoskeletal ? ? Abdominal ?  ?Peds ? Hematology ?  ?Anesthesia Other Findings ? ? Reproductive/Obstetrics ? ?  ? ? ? ? ? ? ? ? ? ? ? ? ? ?  ?  ? ? ? ? ? ? ? ? ?Anesthesia Physical ?Anesthesia Plan ? ?ASA: 3 and emergent ? ?Anesthesia Plan: General  ? ?Post-op Pain Management:   ? ?Induction: Intravenous ? ?PONV Risk Score and Plan: 2 and Ondansetron, Dexamethasone, Midazolam and Treatment may vary due to age or medical condition ? ?Airway Management Planned: Oral ETT ? ?Additional Equipment: Arterial line ? ?Intra-op Plan:  ? ?Post-operative Plan: Extubation in OR ? ?Informed Consent: I have reviewed the patients History and Physical, chart, labs and discussed the procedure including the risks, benefits and alternatives for the proposed anesthesia with the patient or authorized representative who has indicated his/her understanding and acceptance.  ? ? ? ?Dental advisory given ? ?Plan Discussed with: CRNA, Anesthesiologist and Surgeon ? ?Anesthesia Plan Comments:   ? ? ? ? ? ? ?Anesthesia Quick Evaluation ? ?

## 2021-09-15 NOTE — Progress Notes (Signed)
eLink Physician-Brief Progress Note ?Patient Name: David Shields ?DOB: 1991-02-08 ?MRN: [id number] ? ? ?Date of Service ? 09/15/2021  ?HPI/Events of Note ? 31 y.o. male with PMH significant for anxiety who went to bed at 2100 and woke up an hour later, attempted to get out of bed and fell.  He was noted to have a L gaze preference, R sided ataxia, dysconjugate gaze, headache and vomited. He was brought in to the ED as code stroke.  CTH w/o contrast negative for a large hypodensity concerning for a large territory infarct or hyperdensity concerning for an ICH.  He and wife eventually opted for tNKASE. ? CT angio head and neck with concern for possible R vert dissection at V2/3 with patent V4 and no basilar occlusion, bilateral PCA patent, R and L PICA appear patent.   ? Code IR was activated for worsening symptoms.  ?Now Status post endovascular therapeutic occlusion of the right vertebral artery at V3 V4 with coiling.  In the ICU intubated and sedated.  ?eICU Interventions ? Chart reviewed  ? ? ? ?Intervention Category ?Evaluation Type: New Patient Evaluation ? Mauri Brooklyn, P ?09/15/2021, 5:46 AM ?

## 2021-09-15 NOTE — Anesthesia Procedure Notes (Signed)
Procedure Name: Intubation ?Date/Time: 09/15/2021 2:54 AM ?Performed by: Claudina Lick, CRNA ?Pre-anesthesia Checklist: Patient identified, Emergency Drugs available, Suction available and Patient being monitored ?Patient Re-evaluated:Patient Re-evaluated prior to induction ?Oxygen Delivery Method: Circle system utilized ?Preoxygenation: Pre-oxygenation with 100% oxygen ?Induction Type: IV induction, Rapid sequence and Cricoid Pressure applied ?Laryngoscope Size: Hyacinth Meeker and 2 ?Grade View: Grade I ?Tube type: Oral ?Tube size: 8.0 mm ?Number of attempts: 1 ?Airway Equipment and Method: Stylet ?Placement Confirmation: ETT inserted through vocal cords under direct vision, positive ETCO2 and breath sounds checked- equal and bilateral ?Secured at: 23 cm ?Tube secured with: Tape ?Dental Injury: Teeth and Oropharynx as per pre-operative assessment  ? ? ? ? ?

## 2021-09-15 NOTE — Transfer of Care (Signed)
Immediate Anesthesia Transfer of Care Note ? ?Patient: David Shields ? ?Procedure(s) Performed: IR WITH ANESTHESIA ? ?Patient Location: ICU ? ?Anesthesia Type:General ? ?Level of Consciousness: Patient remains intubated per anesthesia plan ? ?Airway & Oxygen Therapy: Patient remains intubated per anesthesia plan and Patient placed on Ventilator (see vital sign flow sheet for setting) ? ?Post-op Assessment: Report given to RN and Post -op Vital signs reviewed and stable ? ?Post vital signs: Reviewed and stable ? ?Last Vitals:  ?Vitals Value Taken Time  ?BP 114/90 09/15/21 0500  ?Temp    ?Pulse 106 09/15/21 0500  ?Resp 18 09/15/21 0500  ?SpO2 96 % 09/15/21 0500  ?Vitals shown include unvalidated device data. ? ?Last Pain:  ?Vitals:  ? 09/15/21 0141  ?TempSrc: Oral  ?   ? ?  ? ?Complications: No notable events documented. ?

## 2021-09-15 NOTE — Progress Notes (Signed)
PHARMACIST CODE STROKE RESPONSE ? ?Notified to mix TNK at 0012 by Dr. Lorrin Goodell (had mixed already while awaiting decision from pt/family) ?Delivered TNK to RN at 0012 ? ?TNK dose = 23 mg IV over 5 seconds ? ?Issues/delays encountered: Pt wanted to wait for discussion with wife before accepting tenecteplase. ? ?Wynona Neat, PharmD, BCPS  ?09/15/21 12:19 AM ? ?

## 2021-09-15 NOTE — ED Provider Notes (Signed)
?[facility] ?Provider Note ? ? ?CSN: [id number] ?Arrival date & time: 09/14/21  2352 ? ?  ? ?History ? ?Chief Complaint  ?Patient presents with  ? Code Stroke  ? ? ?David Shields is a 31 y.o. male. ? ?The history is provided by the EMS personnel.  ?David Shields is a 31 y.o. male who presents to the Emergency Department complaining of code stroke.  He presents to the emergency department by EMS for evaluation of strokelike symptoms.  He was last known well at 9 PM.  He woke up at 1045 with right-sided weakness, facial droop and numbness.  EMS activated a code stroke in the field.  Level 5 caveat due to acuity of condition. ?  ? ?Home Medications ?Prior to Admission medications   ?Medication Sig Start Date End Date Taking? Authorizing Provider  ?ondansetron (ZOFRAN) 4 MG tablet Take 1 tablet (4 mg total) by mouth every 8 (eight) hours as needed for nausea or vomiting. ?Patient not taking: Reported on 03/04/2015 02/28/15   Francine Graven, DO  ?ondansetron (ZOFRAN-ODT) 8 MG disintegrating tablet Take 1 tablet (8 mg total) by mouth every 8 (eight) hours as needed for nausea or vomiting. ?Patient not taking: Reported on 09/15/2021 03/02/15   Davonna Belling, MD  ?promethazine (PHENERGAN) 25 MG suppository Place 1 suppository (25 mg total) rectally every 6 (six) hours as needed for nausea or vomiting. ?Patient not taking: Reported on 09/15/2021 03/04/15   Orpah Greek, MD  ?promethazine (PHENERGAN) 25 MG tablet Take 1 tablet (25 mg total) by mouth every 6 (six) hours as needed for nausea. ?Patient not taking: Reported on 09/15/2021 03/02/15   Davonna Belling, MD  ?ranitidine (ZANTAC) 150 MG tablet Take 1 tablet (150 mg total) by mouth 2 (two) times daily. ?Patient not taking: Reported on 09/15/2021 03/04/15   Orpah Greek, MD  ?   ? ?Allergies    ?Banana   ? ?Review of Systems   ?Review of Systems  ?Unable to perform ROS: Mental status change  ? ?Physical Exam ?Updated  Vital Signs ?BP 97/73   Pulse 93   Temp (!) 96.2 ?F (35.7 ?C) (Axillary) Comment: Reported to the nurse, warm blankets applied  Resp 14   Ht 6' (1.829 m)   Wt 93 kg   SpO2 99%   BMI 27.80 kg/m?  ?Physical Exam ?Vitals and nursing note reviewed.  ?Constitutional:   ?   General: He is in acute distress.  ?   Appearance: He is well-developed. He is ill-appearing.  ?HENT:  ?   Head: Normocephalic and atraumatic.  ?Cardiovascular:  ?   Rate and Rhythm: Normal rate and regular rhythm.  ?   Heart sounds: No murmur heard. ?Pulmonary:  ?   Effort: Pulmonary effort is normal. No respiratory distress.  ?   Breath sounds: Normal breath sounds.  ?Abdominal:  ?   Palpations: Abdomen is soft.  ?   Tenderness: There is no abdominal tenderness. There is no guarding or rebound.  ?Musculoskeletal:     ?   General: No tenderness.  ?Skin: ?   General: Skin is warm and dry.  ?Neurological:  ?   Mental Status: He is alert.  ?   Comments: Right-sided gaze preference, right-sided facial droop.  5 out of 5 strength in all 4 extremities  ?Psychiatric:     ?   Behavior: Behavior normal.  ? ? ?ED Results / Procedures / Treatments   ?Labs ?(all labs ordered  are listed, but only abnormal results are displayed) ?Labs Reviewed  ?CBC - Abnormal; Notable for the following components:  ?    Result Value  ? WBC 15.2 (*)   ? All other components within normal limits  ?DIFFERENTIAL - Abnormal; Notable for the following components:  ? Lymphs Abs 8.2 (*)   ? Abs Immature Granulocytes 0.20 (*)   ? All other components within normal limits  ?COMPREHENSIVE METABOLIC PANEL - Abnormal; Notable for the following components:  ? Potassium 3.1 (*)   ? Glucose, Bld 169 (*)   ? Calcium 8.5 (*)   ? AST 56 (*)   ? ALT 54 (*)   ? All other components within normal limits  ?URINALYSIS, ROUTINE W REFLEX MICROSCOPIC - Abnormal; Notable for the following components:  ? Color, Urine STRAW (*)   ? All other components within normal limits  ?LIPID PANEL - Abnormal;  Notable for the following components:  ? LDL Cholesterol 130 (*)   ? All other components within normal limits  ?CBC WITH DIFFERENTIAL/PLATELET - Abnormal; Notable for the following components:  ? WBC 11.7 (*)   ? Neutro Abs 10.0 (*)   ? All other components within normal limits  ?BASIC METABOLIC PANEL - Abnormal; Notable for the following components:  ? Potassium 3.4 (*)   ? Glucose, Bld 132 (*)   ? Calcium 8.8 (*)   ? All other components within normal limits  ?I-STAT CHEM 8, ED - Abnormal; Notable for the following components:  ? Potassium 3.1 (*)   ? Glucose, Bld 166 (*)   ? Calcium, Ion 1.05 (*)   ? All other components within normal limits  ?POCT I-STAT 7, (LYTES, BLD GAS, ICA,H+H) - Abnormal; Notable for the following components:  ? Calcium, Ion 1.14 (*)   ? All other components within normal limits  ?RESP PANEL BY RT-PCR (FLU A&B, COVID) ARPGX2  ?MRSA NEXT GEN BY PCR, NASAL  ?ETHANOL  ?PROTIME-INR  ?APTT  ?RAPID URINE DRUG SCREEN, HOSP PERFORMED  ?HEMOGLOBIN A1C  ?TSH  ?HIV ANTIBODY (ROUTINE TESTING W REFLEX)  ?BLOOD GAS, ARTERIAL  ?CBC WITH DIFFERENTIAL/PLATELET  ? ? ?EKG ?EKG Interpretation ? ?Date/Time:  Saturday September 15 2021 01:23:58 EDT ?Ventricular Rate:  115 ?PR Interval:    ?QRS Duration: 110 ?QT Interval:  361 ?QTC Calculation: 500 ?R Axis:   71 ?Text Interpretation: Atrial fibrillation RSR' in V1 or V2, probably normal variant Probable anteroseptal infarct, old Borderline ST depression, anterolateral leads Prolonged QT interval Confirmed by Quintella Reichert [phone]) on 09/15/2021 2:13:15 AM ? ?Radiology ?CT HEAD WO CONTRAST (5MM) ? ?Result Date: 09/15/2021 ?CLINICAL DATA:  Neuro deficit, acute, stroke suspected. Mental status change. EXAM: CT HEAD WITHOUT CONTRAST TECHNIQUE: Contiguous axial images were obtained from the base of the skull through the vertex without intravenous contrast. RADIATION DOSE REDUCTION: This exam was performed according to the departmental dose-optimization program which  includes automated exposure control, adjustment of the mA and/or kV according to patient size and/or use of iterative reconstruction technique. COMPARISON:  09/14/2021. FINDINGS: Brain: No acute intracranial hemorrhage, midline shift or mass effect. No extra-axial fluid collection. There is hypodense region in the cerebellum on the left, which may be artifactual. Gray-white matter differentiation is within normal limits. No hydrocephalus. Vascular: No hyperdense vessel or unexpected calcification. Skull: Normal. Negative for fracture or focal lesion. Sinuses/Orbits: Mucosal thickening is present in the maxillary sinus on the left. The orbits are unremarkable. Other: None. IMPRESSION: 1. No acute intracranial hemorrhage. 2. Hypodense region  in the cerebellum on the left possible artifact, however infarct can not be excluded. MRI may be beneficial for further evaluation. Electronically Signed   By: Brett Fairy M.D.   On: 09/15/2021 02:24  ? ?MR BRAIN WO CONTRAST ? ?Result Date: 09/15/2021 ?CLINICAL DATA:  Acute neurologic deficit EXAM: MRI HEAD WITHOUT CONTRAST TECHNIQUE: Multiplanar, multiecho pulse sequences of the brain and surrounding structures were obtained without intravenous contrast. COMPARISON:  None. FINDINGS: Only diffusion-weighted and susceptibility weighted imaging was obtained. This shows punctate areas of acute ischemia in the right cerebellar hemisphere, as well as an area of ischemia in the dorsal right medulla oblongata. No acute or chronic hemorrhage. IMPRESSION: Punctate areas of acute ischemia in the right cerebellar hemisphere and dorsal right medulla oblongata. Electronically Signed   By: Ulyses Jarred M.D.   On: 09/15/2021 01:21  ? ?CT CEREBRAL PERFUSION W CONTRAST ? ?Result Date: 09/15/2021 ?CLINICAL DATA:  Acute neurologic deficit EXAM: CT PERFUSION BRAIN TECHNIQUE: Multiphase CT imaging of the brain was performed following IV bolus contrast injection. Subsequent parametric perfusion maps  were calculated using RAPID software. RADIATION DOSE REDUCTION: This exam was performed according to the departmental dose-optimization program which includes automated exposure control, adjustment of the mA and/or

## 2021-09-15 NOTE — Progress Notes (Addendum)
STROKE TEAM PROGRESS NOTE  ? ?INTERVAL HISTORY ?Patient is seen in his room with his brother at the bedside.  He has been extubated this morning and reports that yesterday, he experienced right sided ataxia, right sided weakness, inability to walk, dysconjugate gaze, left gaze preference, headache and vomiting.  His family called EMS, and he was brought to the ED.  He was given TNK to treat possible stroke, but symptoms worsened.  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  He was taken to IR and the right vertebral artery was coiled. ? ?Vitals:  ? 09/15/21 1130 09/15/21 1151 09/15/21 1200 09/15/21 1300  ?BP: 112/90  (!) 112/94 107/85  ?Pulse: 97  94 92  ?Resp: (!) 23  18 16   ?Temp:  (!) 97.5 ?F (36.4 ?C)    ?TempSrc:  Oral    ?SpO2: 100%  100% 98%  ?Weight:      ?Height:      ? ?CBC:  ?Recent Labs  ?Lab 09/14/21 ?0002 09/15/21 ?0009 09/15/21 ?0602 09/15/21 ?[id number]  ?WBC 15.2*  --  11.7*  --   ?NEUTROABS 6.1  --  10.0*  --   ?HGB 14.4   < > 15.2 15.6  ?HCT 43.7   < > 45.4 46.0  ?MCV 86.0  --  82.7  --   ?PLT 211  --  183  --   ? < > = values in this interval not displayed.  ? ?Basic Metabolic Panel:  ?Recent Labs  ?Lab 09/14/21 ?0002 09/15/21 ?0009 09/15/21 ?0602 09/15/21 ?[id number]  ?NA 138 139 137 138  ?K 3.1* 3.1* 3.4* 3.5  ?CL 103 101 103  --   ?CO2 25  --  24  --   ?GLUCOSE 169* 166* 132*  --   ?BUN 12 14 8   --   ?CREATININE 1.01 0.90 0.76  --   ?CALCIUM 8.5*  --  8.8*  --   ? ?Lipid Panel:  ?Recent Labs  ?Lab 09/15/21 ?[id number]  ?CHOL 192  ?TRIG 86  ?HDL 45  ?CHOLHDL 4.3  ?VLDL 17  ?LDLCALC 130*  ? ?HgbA1c:  ?Recent Labs  ?Lab 09/15/21 ?[id number]  ?HGBA1C 5.4  ? ?Urine Drug Screen:  ?Recent Labs  ?Lab 09/15/21 ?0030  ?LABOPIA NONE DETECTED  ?COCAINSCRNUR NONE DETECTED  ?LABBENZ NONE DETECTED  ?AMPHETMU NONE DETECTED  ?THCU NONE DETECTED  ?LABBARB NONE DETECTED  ?  ?Alcohol Level  ?Recent Labs  ?Lab 09/14/21 ?0004  ?ETH <10  ? ? ?IMAGING past  24 hours ?CT HEAD WO CONTRAST (5MM) ? ?Result Date: 09/15/2021 ?CLINICAL DATA:  Neuro deficit, acute, stroke suspected. Mental status change. EXAM: CT HEAD WITHOUT CONTRAST TECHNIQUE: Contiguous axial images were obtained from the base of the skull through the vertex without intravenous contrast. RADIATION DOSE REDUCTION: This exam was performed according to the departmental dose-optimization program which includes automated exposure control, adjustment of the mA and/or kV according to patient size and/or use of iterative reconstruction technique. COMPARISON:  09/14/2021. FINDINGS: Brain: No acute intracranial hemorrhage, midline shift or mass effect. No extra-axial fluid collection. There is hypodense region in the cerebellum on the left, which may be artifactual. Gray-white matter differentiation is within normal limits. No hydrocephalus. Vascular: No hyperdense vessel or unexpected calcification. Skull: Normal. Negative for fracture or focal lesion. Sinuses/Orbits: Mucosal thickening is present in the maxillary sinus on the left. The orbits are unremarkable. Other: None. IMPRESSION: 1. No acute intracranial hemorrhage. 2.  Hypodense region in the cerebellum on the left possible artifact, however infarct can not be excluded. MRI may be beneficial for further evaluation. Electronically Signed   By: Brett Fairy M.D.   On: 09/15/2021 02:24  ? ?MR BRAIN WO CONTRAST ? ?Result Date: 09/15/2021 ?CLINICAL DATA:  Acute neurologic deficit EXAM: MRI HEAD WITHOUT CONTRAST TECHNIQUE: Multiplanar, multiecho pulse sequences of the brain and surrounding structures were obtained without intravenous contrast. COMPARISON:  None. FINDINGS: Only diffusion-weighted and susceptibility weighted imaging was obtained. This shows punctate areas of acute ischemia in the right cerebellar hemisphere, as well as an area of ischemia in the dorsal right medulla oblongata. No acute or chronic hemorrhage. IMPRESSION: Punctate areas of acute ischemia  in the right cerebellar hemisphere and dorsal right medulla oblongata. Electronically Signed   By: Ulyses Jarred M.D.   On: 09/15/2021 01:21  ? ?CT CEREBRAL PERFUSION W CONTRAST ? ?Result Date: 09/15/2021 ?CLINICAL DATA:  Acute neurologic deficit EXAM: CT PERFUSION BRAIN TECHNIQUE: Multiphase CT imaging of the brain was performed following IV bolus contrast injection. Subsequent parametric perfusion maps were calculated using RAPID software. RADIATION DOSE REDUCTION: This exam was performed according to the departmental dose-optimization program which includes automated exposure control, adjustment of the mA and/or kV according to patient size and/or use of iterative reconstruction technique. CONTRAST:  62mL OMNIPAQUE IOHEXOL 350 MG/ML SOLN, 112mL OMNIPAQUE IOHEXOL 350 MG/ML SOLN COMPARISON:  None. FINDINGS: CT Brain Perfusion Findings: CBF (<30%) Volume: 65mL Perfusion (Tmax>6.0s) volume: 162mL Mismatch Volume: 145mL ASPECTS on noncontrast CT Head: 10 at 12 o'clock a.m. today. Infarct Core: 0 mL Infarction Location:None The calculated area of elevated Tmax is suspected to be artifactual on the basis of motion. The time attenuation curves are abnormal, also suggesting that the results are unreliable. IMPRESSION: Motion degraded exam with unreliable results. Electronically Signed   By: Ulyses Jarred M.D.   On: 09/15/2021 00:41  ? ?DG CHEST PORT 1 VIEW ? ?Result Date: 09/15/2021 ?CLINICAL DATA:  ETT placement; acute ischemic multifocal posterior circulation stroke involving right sided vessel EXAM: PORTABLE CHEST 1 VIEW COMPARISON:  01/16/2011. FINDINGS: Endotracheal tube in good position with tip at the level of the clavicular heads. Gastric tube courses below the diaphragm and is looped in the stomach with tip outside the field of view. Mild patient rotation. Clear lungs. No visible pleural effusions or pneumothorax on this semi erect radiograph. Cardiomediastinal silhouette is within normal limits. IMPRESSION: 1.  Endotracheal tube in good position with tip at the level of the clavicular heads. 2. No evidence of acute cardiopulmonary disease. Electronically Signed   By: Margaretha Sheffield M.D.   On: 09/15/2021 08:34  ? ?CT HEAD CODE STROKE WO CONTRAST ? ?Result Date: 09/15/2021 ?CLINICAL DATA:  Code stroke.  Right-sided gaze and slurred speech EXAM: CT HEAD WITHOUT CONTRAST TECHNIQUE: Contiguous axial images were obtained from the base of the skull through the vertex without intravenous contrast. RADIATION DOSE REDUCTION: This exam was performed according to the departmental dose-optimization program which includes automated exposure control, adjustment of the mA and/or kV according to patient size and/or use of iterative reconstruction technique. COMPARISON:  None. FINDINGS: Brain: There is no mass, hemorrhage or extra-axial collection. The size and configuration of the ventricles and extra-axial CSF spaces are normal. The brain parenchyma is normal, without evidence of acute or chronic infarction. Vascular: No abnormal hyperdensity of the major intracranial arteries or dural venous sinuses. No intracranial atherosclerosis. Skull: The visualized skull base, calvarium and extracranial soft tissues are normal. Sinuses/Orbits:  No fluid levels or advanced mucosal thickening of the visualized paranasal sinuses. No mastoid or middle ear effusion. The orbits are normal. ASPECTS [facility] Stroke Program Early CT Score) - Ganglionic level infarction (caudate, lentiform nuclei, internal capsule, insula, M1-M3 cortex): 7 - Supraganglionic infarction (M4-M6 cortex): 3 Total score (0-10 with 10 being normal): 10 IMPRESSION: 1. Normal head CT. 2. ASPECTS is 10. 3. These results were communicated to Dr. Donnetta Simpers at 12:05 am on 09/15/2021 by text page via the [facility] messaging system. Electronically Signed   By: Ulyses Jarred M.D.   On: 09/15/2021 00:05  ? ?CT ANGIO HEAD NECK W WO CM (CODE STROKE) ? ?Result Date: 09/15/2021 ?CLINICAL  DATA:  Multiple acute neurologic symptoms EXAM: CT ANGIOGRAPHY HEAD AND NECK TECHNIQUE: Multidetector CT imaging of the head and neck was performed using the standard protocol during bolus administration of

## 2021-09-15 NOTE — Procedures (Signed)
Extubation Procedure Note ? ?Patient Details:   ?Name: David Shields ?DOB: [birth year] ?MRN: [id number] ?  ?Airway Documentation:  ?  ?Vent end date: 09/15/21 Vent end time: 0913  ? ?Evaluation ? O2 sats: stable throughout ?Complications: No apparent complications ?Patient did tolerate procedure well. ?Bilateral Breath Sounds: Clear ?  ?Yes ? ?Pt extubated to 2L [place]. Cuff leak present, no stridor noted. RN at bedside, Pt tolerated well, MD aware, RT will continue to monitor.  ? ?Rosalita Levan ?09/15/2021, 9:15 AM ? ?

## 2021-09-15 NOTE — Evaluation (Signed)
Physical Therapy Evaluation ?Patient Details ?Name: David Shields ?MRN: [id number] ?DOB: Feb 07, 1991 ?Today's Date: 09/15/2021 ? ?History of Present Illness ? yler Carignan is a 31 y.o. male who went to bed around 2100 3/17, woke up an hour later and fell and he was noted to have ataxia, dysconjugate gaze and L gaze preference, headache and vomiting. MRI showed R cerebellar CVA. Pt taken to IR and found to have dissected R vertebral artery and underwent coiling.  Pt received TNK. PMH: anxiety  ?Clinical Impression ? Pt admitted with above diagnosis. Pt with new dx of afib and reports he can feel heart racing when pulse elevated and this is a familiar feeling to him and he always thought it was his anxiety. 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 Pt with equal strength bilaterally and able to stand EOB and take steps with min HHA. HR up in 120's with activity and SPO2 88-90% on 2L O2. Did not progress ambulation today due to pt being so nervous and HR elevation but plan to next visit.  Will follow pt acutely but do not anticipate him having PT needs at d/c. He will need some guidance on exercise parameters as he wants to be active but very scared right now. Pt currently with functional limitations due to the deficits listed below (see PT Problem List). Pt will benefit from skilled PT to increase their independence and safety with mobility to allow discharge to the venue listed below.   ?  BP supine 111/ 80 ?Sitting 122/94 ?Standing 112/86 (dizzy) ?Stand 3 mins 116/86 ?   ? ?Recommendations for follow up therapy are one component of a multi-disciplinary discharge planning process, led by the attending physician.  Recommendations may be updated based on patient status, additional functional criteria and insurance authorization. ? ?Follow Up Recommendations No PT follow up ? ?  ?Assistance Recommended at Discharge  None  ?Patient can return home with the following ? Assist for transportation ? ?  ?Equipment Recommendations None recommended by PT  ?Recommendations for Other Services ?    ?  ?Functional Status Assessment Patient has had a recent decline in their functional status and demonstrates the ability to make significant improvements in function in a reasonable and predictable amount of time.  ? ?  ?Precautions / Restrictions Precautions ?Precautions: Other (comment) ?Precaution Comments: watch HR, new afib ?Restrictions ?Weight Bearing Restrictions: No  ? ?  ? ?Mobility ? Bed Mobility ?Overal bed mobility: Independent ?  ?  ?  ?  ?  ?  ?General bed mobility comments: pt able to sit up into long sitting and then pivot to EOB ?  ? ?Transfers ?Overall transfer level: Needs assistance ?Equipment used: 1 person hand held assist ?Transfers: Sit to/from Stand ?Sit to Stand: Min assist ?  ?  ?  ?  ?  ?General transfer comment: HHA due to pt feeling so nervous, no physical assist for power up. Pt reports mild dizziness in standing ?  ? ?Ambulation/Gait ?Ambulation/Gait assistance: Min assist ?Gait Distance (Feet): 3 Feet ?Assistive device: 1 person hand held assist ?Gait Pattern/deviations: Step-to pattern ?  ?  ?  ?General Gait Details: stepped along  bedside R and L but pt so nervous, did not step away from bedside. Pt agreeable to progressing next session ? ?Stairs ?  ?  ?  ?  ?  ? ?Wheelchair Mobility ?  ? ?Modified  Rankin (Stroke Patients Only) ?Modified Rankin (Stroke Patients Only) ?Pre-Morbid Rankin Score: No symptoms ?Modified Rankin: Slight disability ? ?  ? ?Balance Overall balance assessment: Mild deficits observed, not formally tested ?  ?  ?  ?  ?  ?  ?  ?  ?  ?  ?  ?  ?  ?  ?  ?  ?  ?  ?   ? ? ? ?Pertinent Vitals/Pain Pain Assessment ?Pain Assessment: No/denies pain  ? ? ?Home Living Family/patient expects to be discharged to:: Private residence ?Living Arrangements: Spouse/significant other;Children ?Available  Help at Discharge: Family;Available PRN/intermittently ?Type of Home: Apartment ?Home Access: Stairs to enter ?  ?Entrance Stairs-Number of Steps: flight ?  ?Home Layout: One level ?Home Equipment: None ?Additional Comments: has 4 kids (3 live in the home)  ?  ?Prior Function Prior Level of Function : Independent/Modified Independent;Working/employed;Driving ?  ?  ?  ?  ?  ?  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  ?  ? ? ?Hand Dominance  ? Dominant Hand: Right ? ?  ?Extremity/Trunk Assessment  ? Upper Extremity Assessment ?Upper Extremity Assessment: Defer to OT evaluation ?  ? ?Lower Extremity Assessment ?Lower Extremity Assessment: Overall WFL for tasks assessed ?  ? ?Cervical / Trunk Assessment ?Cervical / Trunk Assessment: Normal  ?Communication  ? Communication: No difficulties  ?Cognition Arousal/Alertness: Awake/alert ?Behavior During Therapy: [facility] for tasks assessed/performed, Anxious ?Overall Cognitive Status: Within Functional Limits for tasks assessed ?  ?  ?  ?  ?  ?  ?  ?  ?  ?  ?  ?  ?  ?  ?  ?  ?General Comments: cognition in tact however, pt very anxious. 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  ?  ? ?  ?General Comments   ? ?  ?Exercises    ? ?Assessment/Plan  ?  ?PT Assessment Patient needs continued PT services  ?PT Problem List Decreased mobility;Decreased knowledge of precautions;Cardiopulmonary status limiting activity ? ?   ?  ?PT Treatment Interventions Gait training;Stair training;Functional mobility training;Therapeutic activities;Therapeutic exercise;Balance training;Patient/family education   ? ?PT Goals (Current goals can be found in the Care Plan section)  ?Acute Rehab PT Goals ?Patient Stated Goal: get back to normal ?PT Goal Formulation: With patient ?Time For Goal Achievement: 09/29/21 ?Potential to Achieve Goals: Good ? ?  ?Frequency Min 4X/week ?  ? ? ?Co-evaluation   ?  ?   ?  ?  ? ? ?  ?AM-PAC PT "6 Clicks" Mobility  ?Outcome Measure Help needed turning from your back to your side while in a flat bed without using bedrails?: None ?Help needed moving from lying on your back to sitting on the side of a flat bed without using bedrails?: None ?Help needed moving to and from a bed to a chair (including a wheelchair)?: None ?Help needed standing up from a chair using your arms (e.g., wheelchair or bedside chair)?: None ?Help needed to walk in hospital room?: A Little ?Help needed climbing 3-5 steps with a railing? : A Little ?6 Click Score: 22 ? ?  ?End of Session Equipment Utilized During Treatment: Oxygen ?Activity Tolerance: Patient tolerated treatment well ?Patient left: in bed;with call bell/phone within reach ?Nurse Communication: Mobility status ?PT Visit Diagnosis: Unsteadiness on feet (R26.81) ?  ? ?Time: [id number] ?PT Time Calculation (min) (ACUTE ONLY): 26 min ? ? ?  Charges:   PT Evaluation ?$PT Eval Moderate Complexity: 1 Mod ?PT Treatments ?$Therapeutic Activity: 8-22 mins ?  ?   ? ? ?Leighton Roach, PT  ?Acute Rehab Services ? Pager [phone] ?Office [phone] ? ? ?[place] ?09/15/2021, 3:33 PM ? ?

## 2021-09-15 NOTE — Progress Notes (Signed)
3 ? ?NAME:  David Shields, MRN:  [id number], DOB:  23-Feb-1991, LOS: 0 ?ADMISSION DATE:  09/14/2021, CONSULTATION DATE:  3/18 ?REFERRING MD:  Corliss Skains, CHIEF COMPLAINT:  Post stroke management  ? 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  Given TNKASE, symptoms worsened including change in mental status and he went to IR for coiling of R vertebral artery at V3-4.  Required intubation for airway protection. ? ?Pertinent  Medical History  ?Anxiety ? 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  Given TNKASE, symptoms worsened including change in mental status and he went to IR for coiling of R vertebral artery at V3-4.  Required intubation for airway protection. ? ?Interim History / Subjective:  ?Wakes to voice this morning, nods head for understanding ? ?Objective   ?Blood pressure 97/73, pulse 93, temperature (!) 96.2 ?F (35.7 ?C), temperature source Axillary, resp. rate 14, height 6' (1.829 m), weight 93 kg, SpO2 100 %. ?   ?Vent Mode: PRVC ?FiO2 (%):  [50 %] 50 % ?Set Rate:  [14 bmp] 14 bmp ?Vt Set:  [620 mL] 620 mL ?PEEP:  [5 cmH20] 5 cmH20 ?Plateau Pressure:  [16 cmH20] 16 cmH20  ? ?Intake/Output Summary (Last 24 hours) at 09/15/2021 0858 ?Last data filed at 09/15/2021 0800 ?Gross per 24 hour  ?Intake 647.38 ml  ?Output 1100 ml  ?Net -452.62 ml  ? ?Filed Weights  ? 09/14/21 2300  ?Weight: 93 kg  ? ? ?Examination: ? ?General:  In bed on vent ?HENT: NCAT ETT in place ?PULM: CTA B, vent supported breathing ?CV: RRR, no mgr ?GI: BS+, soft, nontender ?MSK: normal bulk and tone ?Neuro: wakes to voice, nods head to questions ? ? ?Resolved Hospital Problem list   ? ? ?Assessment & Plan:  ?Acute respiratory failure with hypoxemia due to  inability to protect airway from stroke ?SBT now, extubate ?Aspiration precautions ?SLP evaluatino ?NPO ? ?Acute stroke: punctate cerebellar and medullar stroke, due to vertebral artery occlusion ?S/p vertebral artery coiling by neuro-IR ?Monitor neuro status ?Post stroke imaging, secondary stroke prevention, hyeprtension management per IR/stroke service ? ?Anxiety ?Need for sedation for mechanical ventilation ?D/c PAD protocol ? ? ?Best Practice (right click and "Reselect all SmartList Selections" daily)  ? ?Diet/type: NPO ?DVT prophylaxis: SCD ?GI prophylaxis: PPI ?Lines: N/A ?Foley:  N/A ?Code Status:  full code ?Last date of multidisciplinary goals of care discussion [per stroke] ? ?Labs   ?CBC: ?Recent Labs  ?Lab 09/14/21 ?0002 09/15/21 ?0009 09/15/21 ?0602 09/15/21 ?2641  ?WBC 15.2*  --  11.7*  --   ?NEUTROABS 6.1  --  10.0*  --   ?HGB 14.4 15.0 15.2 15.6  ?HCT 43.7 44.0 45.4 46.0  ?MCV 86.0  --  82.7  --   ?PLT 211  --  183  --   ? ? ?Basic Metabolic Panel: ?Recent Labs  ?Lab 09/14/21 ?0002 09/15/21 ?0009 09/15/21 ?0602 09/15/21 ?5830  ?NA 138 139 137 138  ?K 3.1* 3.1* 3.4* 3.5  ?CL 103 101 103  --   ?CO2 25  --  24  --   ?GLUCOSE 169* 166* 132*  --   ?BUN 12 14 8   --   ?CREATININE 1.01 0.90 0.76  --   ?CALCIUM 8.5*  --  8.8*  --   ? ?GFR: ?Estimated Creatinine Clearance: 149.5 mL/min (by C-G formula based on SCr of 0.76 mg/dL). ?Recent Labs  ?Lab 09/14/21 ?0002 09/15/21 ?0602  ?WBC 15.2* 11.7*  ? ? ?Liver Function Tests: ?Recent Labs  ?Lab 09/14/21 ?0002  ?AST 56*  ?ALT 54*  ?ALKPHOS 67  ?BILITOT 0.9  ?PROT 6.6  ?ALBUMIN 3.8  ? ?No results for input(s): LIPASE, AMYLASE in the last 168 hours. ?No results for input(s): AMMONIA in the last 168 hours. ? ?ABG ?   ?Component Value Date/Time  ? PHART 7.409 09/15/2021 0612  ? PCO2ART 38.8 09/15/2021 0612  ? PO2ART 100 09/15/2021 0612  ? HCO3 24.5 09/15/2021 0612  ? TCO2 26 09/15/2021 0612  ? O2SAT 98 09/15/2021 0612  ?  ? ?Coagulation Profile: ?Recent Labs  ?Lab  09/14/21 ?0002  ?INR 1.0  ? ? ?Cardiac Enzymes: ?No results for input(s): CKTOTAL, CKMB, CKMBINDEX, TROPONINI in the last 168 hours. ? ?HbA1C: ?Hgb A1c MFr Bld  ?Date/Time Value Ref Range Status  ?09/15/2021 06:05 AM 5.4 4.8 - 5.6 % Final  ?  Comment:  ?  (NOTE) ?Pre diabetes:          5.7%-6.4% ? ?Diabetes:              >6.4% ? ?Glycemic control for   <7.0% ?adults with diabetes ?  ? ? ?CBG: ?No results for input(s): GLUCAP in the last 168 hours. ? ?  ? ?Critical care time: 35 minutes ?  ? ?Roselie Awkward, MD ?[facility] ?Pager: [phone] ?Cell: [phone] ?After 7:00 pm call Elink  [phone] ? ? ? ? ?

## 2021-09-16 ENCOUNTER — Inpatient Hospital Stay (HOSPITAL_COMMUNITY): Payer: Non-veteran care

## 2021-09-16 DIAGNOSIS — I63531 Cerebral infarction due to unspecified occlusion or stenosis of right posterior cerebral artery: Secondary | ICD-10-CM

## 2021-09-16 LAB — COMPREHENSIVE METABOLIC PANEL
ALT: 39 U/L (ref 0–44)
AST: 30 U/L (ref 15–41)
Albumin: 3.3 g/dL — ABNORMAL LOW (ref 3.5–5.0)
Alkaline Phosphatase: 64 U/L (ref 38–126)
Anion gap: 6 (ref 5–15)
BUN: 7 mg/dL (ref 6–20)
CO2: 27 mmol/L (ref 22–32)
Calcium: 8.4 mg/dL — ABNORMAL LOW (ref 8.9–10.3)
Chloride: 101 mmol/L (ref 98–111)
Creatinine, Ser: 0.9 mg/dL (ref 0.61–1.24)
GFR, Estimated: >60 mL/min (ref >60)
Glucose, Bld: 117 mg/dL — ABNORMAL HIGH (ref 70–99)
Potassium: 3.4 mmol/L — ABNORMAL LOW (ref 3.5–5.1)
Sodium: 134 mmol/L — ABNORMAL LOW (ref 135–145)
Total Bilirubin: 1.3 mg/dL — ABNORMAL HIGH (ref 0.3–1.2)
Total Protein: 6.2 g/dL — ABNORMAL LOW (ref 6.5–8.1)

## 2021-09-16 LAB — CBC WITH DIFFERENTIAL/PLATELET
Abs Immature Granulocytes: 0.03 10*3/uL (ref 0.00–0.07)
Basophils Absolute: 0 10*3/uL (ref 0.0–0.1)
Basophils Relative: 0 %
Eosinophils Absolute: 0 10*3/uL (ref 0.0–0.5)
Eosinophils Relative: 0 %
HCT: 42.5 % (ref 39.0–52.0)
Hemoglobin: 14.2 g/dL (ref 13.0–17.0)
Immature Granulocytes: 0 %
Lymphocytes Relative: 17 %
Lymphs Abs: 1.8 10*3/uL (ref 0.7–4.0)
MCH: 27.8 pg (ref 26.0–34.0)
MCHC: 33.4 g/dL (ref 30.0–36.0)
MCV: 83.2 fL (ref 80.0–100.0)
Monocytes Absolute: 0.8 10*3/uL (ref 0.1–1.0)
Monocytes Relative: 7 %
Neutro Abs: 8 10*3/uL — ABNORMAL HIGH (ref 1.7–7.7)
Neutrophils Relative %: 76 %
Platelets: 190 10*3/uL (ref 150–400)
RBC: 5.11 MIL/uL (ref 4.22–5.81)
RDW: 13.3 % (ref 11.5–15.5)
WBC: 10.7 10*3/uL — ABNORMAL HIGH (ref 4.0–10.5)
nRBC: 0 % (ref 0.0–0.2)

## 2021-09-16 LAB — ECHOCARDIOGRAM COMPLETE
AR max vel: 2.67 cm2
AV Area VTI: 2.6 cm2
AV Area mean vel: 2.51 cm2
AV Mean grad: 3 mmHg
AV Peak grad: 4.1 mmHg
Ao pk vel: 1.01 m/s
Height: 72 in
S' Lateral: 3.6 cm
Weight: 3280 oz

## 2021-09-16 LAB — HIV ANTIBODY (ROUTINE TESTING W REFLEX): HIV Screen 4th Generation wRfx: NONREACTIVE

## 2021-09-16 MED ORDER — CLOPIDOGREL BISULFATE 75 MG PO TABS
75.0000 mg | ORAL_TABLET | Freq: Every day | ORAL | Status: DC
Start: 2021-09-16 — End: 2021-09-18
  Administered 2021-09-16 – 2021-09-17 (×2): 75 mg via ORAL
  Filled 2021-09-16 (×2): qty 1

## 2021-09-16 MED ORDER — ALUM & MAG HYDROXIDE-SIMETH 200-200-20 MG/5ML PO SUSP: 15.0000 mL | Freq: Four times a day (QID) | ORAL | Status: DC | PRN

## 2021-09-16 MED ORDER — ONDANSETRON HCL 4 MG/2ML IJ SOLN
4.0000 mg | Freq: Once | INTRAMUSCULAR | Status: AC
  Administered 2021-09-16: 4 mg via INTRAVENOUS
  Filled 2021-09-16: qty 2

## 2021-09-16 MED ORDER — POTASSIUM CHLORIDE CRYS ER 20 MEQ PO TBCR
40.0000 meq | EXTENDED_RELEASE_TABLET | ORAL | Status: AC
  Administered 2021-09-16 (×2): 40 meq via ORAL
  Filled 2021-09-16 (×2): qty 2

## 2021-09-16 MED ORDER — SODIUM CHLORIDE 0.45 % IV BOLUS
1000.0000 mL | Freq: Once | INTRAVENOUS | Status: AC
  Administered 2021-09-16: 1000 mL via INTRAVENOUS

## 2021-09-16 MED ORDER — ONDANSETRON HCL 4 MG/2ML IJ SOLN: 4.0000 mg | Freq: Once | INTRAMUSCULAR | Status: AC

## 2021-09-16 MED ORDER — SODIUM CHLORIDE 0.45 % IV BOLUS
500.0000 mL | Freq: Once | INTRAVENOUS | Status: AC
Start: 2021-09-16 — End: 2021-09-16
  Administered 2021-09-16: 500 mL via INTRAVENOUS

## 2021-09-16 MED ORDER — ONDANSETRON HCL 4 MG/2ML IJ SOLN
INTRAMUSCULAR | Status: AC
  Administered 2021-09-16: 4 mg via INTRAVENOUS
  Filled 2021-09-16: qty 2

## 2021-09-16 NOTE — Progress Notes (Signed)
eLink Physician-Brief Progress Note ?Patient Name: David Shields ?DOB: 1991/02/03 ?MRN: [id number] ? ? ?Date of Service ? 09/16/2021  ?HPI/Events of Note ? Patient requesting medication for indigestion.  ?eICU Interventions ? PRN Maalox ordered.  ? ? ? ?  ? ?Frederik Pear ?09/16/2021, 1:24 AM ?

## 2021-09-16 NOTE — Progress Notes (Signed)
Referring Physician(s): Dr. 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  Subjective:  Pt resting in bed. He c/o continuing hiccups and some dizziness while sitting up in bed. He adds he has no appetite.  He denies blurred vision, headache.   Allergies: Banana  Medications: Prior to Admission medications   Medication Sig Start Date End Date Taking? Authorizing Provider  ondansetron (ZOFRAN) 4 MG tablet Take 1 tablet (4 mg total) by mouth every 8 (eight) hours as needed for nausea or vomiting. Patient not taking: Reported on 03/04/2015 02/28/15   Samuel Jester, DO  ondansetron (ZOFRAN-ODT) 8 MG disintegrating tablet Take 1 tablet (8 mg total) by mouth every 8 (eight) hours as needed for nausea or vomiting. Patient not taking: Reported on 09/15/2021 03/02/15   Benjiman Core, MD  promethazine (PHENERGAN) 25 MG suppository Place 1 suppository (25 mg total) rectally every 6 (six) hours as needed for nausea or vomiting. Patient not taking: Reported on 09/15/2021 03/04/15   Gilda Crease, MD  promethazine (PHENERGAN) 25 MG tablet Take 1 tablet (25 mg total) by mouth every 6 (six) hours as needed for nausea. Patient not taking: Reported on 09/15/2021 03/02/15   Benjiman Core, MD  ranitidine (ZANTAC) 150 MG tablet Take 1 tablet (150 mg total) by mouth 2 (two) times daily. Patient not taking: Reported on 09/15/2021 03/04/15   Gilda Crease, MD     Vital Signs: BP 131/86   Pulse 95   Temp 98.2 F (36.8 C) (Oral)   Resp (!) 28   Ht 6' (1.829 m)   Wt 205 lb (93 kg)   SpO2 97%   BMI 27.80 kg/m   Physical Exam Constitutional:      Appearance: Normal appearance. He is not ill-appearing.  HENT:     Head: Atraumatic.  Eyes:     Extraocular Movements: Extraocular movements intact.   Cardiovascular:     Rate and Rhythm: Normal rate and regular rhythm.  Pulmonary:     Effort: Pulmonary effort is normal. No respiratory distress.  Neurological:     Mental Status: He is alert and oriented to person, place, and time.     Comments: Alert, aware and oriented X 3 Speech and comprehension is intact.  PERRLA bilaterally No facial droop noted Can spontaneously move all 4 extremities.     Psychiatric:        Mood and Affect: Mood normal.        Behavior: Behavior normal.        Thought Content: Thought content normal.        Judgment: Judgment normal.    Imaging: CT HEAD WO CONTRAST ( )  Result Date: 09/15/2021 CLINICAL DATA:  Neuro deficit, acute, stroke suspected. Mental status change. EXAM: CT HEAD WITHOUT CONTRAST TECHNIQUE: Contiguous axial images were obtained from the base of the skull through the vertex without intravenous contrast. RADIATION DOSE REDUCTION: This exam was performed according to the departmental dose-optimization program which includes automated exposure control, adjustment of the mA and/or kV according to patient size and/or use of iterative reconstruction technique. COMPARISON:  09/14/2021. FINDINGS: Brain: No acute intracranial hemorrhage, midline shift or mass effect. No extra-axial fluid collection. There is hypodense region in the cerebellum on the left, which may be artifactual. Gray-white matter differentiation is within normal limits. No hydrocephalus. Vascular:  No hyperdense vessel or unexpected calcification. Skull: Normal. Negative for fracture or focal lesion. Sinuses/Orbits: Mucosal thickening is present in the maxillary sinus on the left. The orbits are unremarkable. Other: None. IMPRESSION: 1. No acute intracranial hemorrhage. 2. Hypodense region in the cerebellum on the left possible artifact, however infarct can not be excluded. MRI may be beneficial for further evaluation. Electronically Signed   By: Thornell Sartorius M.D.   On: 09/15/2021  02:24   MR BRAIN WO CONTRAST  Result Date: 09/16/2021 CLINICAL DATA:  Stroke follow-up EXAM: MRI HEAD WITHOUT CONTRAST TECHNIQUE: Multiplanar, multiecho pulse sequences of the brain and surrounding structures were obtained without intravenous contrast. COMPARISON:  09/15/2021 FINDINGS: Brain: Unchanged multifocal right cerebellar early subacute ischemia. 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 No new site of ischemia. No acute or chronic hemorrhage. Normal white matter signal, parenchymal volume and CSF spaces. The midline structures are normal. Vascular: Major flow voids are preserved. Skull and upper cervical spine: Normal calvarium and skull base. Visualized upper cervical spine and soft tissues are normal. Sinuses/Orbits:No paranasal sinus fluid levels or advanced mucosal thickening. No mastoid or middle ear effusion. Normal orbits. IMPRESSION: 1. 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 No hemorrhage or mass effect. 2. Unchanged multifocal right cerebellar early subacute ischemia. Electronically Signed   By: Deatra Robinson M.D.   On: 09/16/2021 03:42   MR BRAIN WO CONTRAST  Result Date: 09/15/2021 CLINICAL DATA:  Acute neurologic deficit EXAM: MRI HEAD WITHOUT CONTRAST TECHNIQUE: Multiplanar, multiecho pulse sequences of the brain and surrounding structures were obtained without intravenous contrast. COMPARISON:  None. FINDINGS: Only diffusion-weighted and susceptibility weighted imaging was obtained. This shows punctate areas of acute ischemia in the right cerebellar hemisphere, as well as an area of ischemia in the dorsal right medulla oblongata. No acute or chronic hemorrhage. IMPRESSION: Punctate areas of acute ischemia in the right cerebellar hemisphere and dorsal right medulla oblongata. Electronically Signed   By: Deatra Robinson M.D.   On: 09/15/2021 01:21   CT CEREBRAL PERFUSION W CONTRAST  Result Date: 09/15/2021 CLINICAL DATA:  Acute neurologic deficit  EXAM: CT PERFUSION BRAIN TECHNIQUE: Multiphase CT imaging of the brain was performed following IV bolus contrast injection. Subsequent parametric perfusion maps were calculated using RAPID software. RADIATION DOSE REDUCTION: This exam was performed according to the departmental dose-optimization program which includes automated exposure control, adjustment of the mA and/or kV according to patient size and/or use of iterative reconstruction technique. CONTRAST:  40mL OMNIPAQUE IOHEXOL 350 MG/ML SOLN, OMNIPAQUE IOHEXOL 350 MG/ML SOLN COMPARISON:  None. FINDINGS: CT Brain Perfusion Findings: CBF (<30%) Volume: 0mL Perfusion (Tmax>6.0s) volume: Mismatch Volume: ASPECTS on noncontrast CT Head: 10 at 12 o'clock a.m. today. Infarct Core: 0 mL Infarction Location:None The calculated area of elevated Tmax is suspected to be artifactual on the basis of motion. The time attenuation curves are abnormal, also suggesting that the results are unreliable. IMPRESSION: Motion degraded exam with unreliable results. Electronically Signed   By: Deatra Robinson M.D.   On: 09/15/2021 00:41   DG CHEST PORT 1 VIEW  Result Date: 09/15/2021 CLINICAL DATA:  ETT placement; acute ischemic multifocal posterior circulation stroke involving right sided vessel EXAM: PORTABLE CHEST 1 VIEW COMPARISON:  01/16/2011. FINDINGS: Endotracheal tube in good position with tip at the level of the clavicular heads. Gastric tube courses below the diaphragm and is looped in the stomach with tip outside the field of view. Mild patient rotation. Clear lungs. No visible pleural  effusions or pneumothorax on this semi erect radiograph. Cardiomediastinal silhouette is within normal limits. IMPRESSION: 1. Endotracheal tube in good position with tip at the level of the clavicular heads. 2. No evidence of acute cardiopulmonary disease. Electronically Signed   By: Feliberto Harts M.D.   On: 09/15/2021 08:34   CT HEAD CODE STROKE WO CONTRAST  Result  Date: 09/15/2021 CLINICAL DATA:  Code stroke.  Right-sided gaze and slurred speech EXAM: CT HEAD WITHOUT CONTRAST TECHNIQUE: Contiguous axial images were obtained from the base of the skull through the vertex without intravenous contrast. RADIATION DOSE REDUCTION: This exam was performed according to the departmental dose-optimization program which includes automated exposure control, adjustment of the mA and/or kV according to patient size and/or use of iterative reconstruction technique. COMPARISON:  None. FINDINGS: Brain: There is no mass, hemorrhage or extra-axial collection. The size and configuration of the ventricles and extra-axial CSF spaces are normal. The brain parenchyma is normal, without evidence of acute or chronic infarction. Vascular: No abnormal hyperdensity of the major intracranial arteries or dural venous sinuses. No intracranial atherosclerosis. Skull: The visualized skull base, calvarium and extracranial soft tissues are normal. Sinuses/Orbits: No fluid levels or advanced mucosal thickening of the visualized paranasal sinuses. No mastoid or middle ear effusion. The orbits are normal. ASPECTS [facility] Stroke Program Early CT Score) - Ganglionic level infarction (caudate, lentiform nuclei, internal capsule, insula, M1-M3 cortex): 7 - Supraganglionic infarction (M4-M6 cortex): 3 Total score (0-10 with 10 being normal): 10 IMPRESSION: 1. Normal head CT. 2. ASPECTS is 10. 3. These results were communicated to Dr. Erick Blinks at 12:05 am on 09/15/2021 by text page via the [facility] messaging system. Electronically Signed   By: Deatra Robinson M.D.   On: 09/15/2021 00:05   CT ANGIO HEAD NECK W WO CM (CODE STROKE)  Result Date: 09/15/2021 CLINICAL DATA:  Multiple acute neurologic symptoms EXAM: CT ANGIOGRAPHY HEAD AND NECK TECHNIQUE: Multidetector CT imaging of the head and neck was performed using the standard protocol during bolus administration of intravenous contrast. Multiplanar CT image  reconstructions and MIPs were obtained to evaluate the vascular anatomy. Carotid stenosis measurements (when applicable) are obtained utilizing NASCET criteria, using the distal internal carotid diameter as the denominator. RADIATION DOSE REDUCTION: This exam was performed according to the departmental dose-optimization program which includes automated exposure control, adjustment of the mA and/or kV according to patient size and/or use of iterative reconstruction technique. CONTRAST:  OMNIPAQUE IOHEXOL 350 MG/ML SOLN COMPARISON:  None. FINDINGS: CTA NECK FINDINGS SKELETON: There is no bony spinal canal stenosis. No lytic or blastic lesion. OTHER NECK: Normal pharynx, larynx and major salivary glands. No cervical lymphadenopathy. Unremarkable thyroid gland. UPPER CHEST: Ground-glass opacity in the left upper lobe. AORTIC ARCH: There is no calcific atherosclerosis of the aortic arch. There is no aneurysm, dissection or hemodynamically significant stenosis of the visualized portion of the aorta. Conventional 3 vessel aortic branching pattern. The visualized proximal subclavian arteries are widely patent. RIGHT CAROTID SYSTEM: Normal without aneurysm, dissection or stenosis. LEFT CAROTID SYSTEM: Normal without aneurysm, dissection or stenosis. VERTEBRAL ARTERIES: Left dominant configuration. The right vertebral artery is diminutive. There is loss of opacification at the right V2 V3 junction, at the right C1 transverse foramen. The left vertebral artery is normal. CTA HEAD FINDINGS POSTERIOR CIRCULATION: --Vertebral arteries: Diminished opacification of the patent right V4 segment. Left V4 is normal. --Inferior cerebellar arteries: Normal. --Basilar artery: Normal. --Superior cerebellar arteries: Normal. --Posterior cerebral arteries (PCA): Normal. ANTERIOR CIRCULATION: --Intracranial internal  carotid arteries: Normal. --Anterior cerebral arteries (ACA): Normal. Absent right A1 segment, normal variant --Middle  cerebral arteries (MCA): Normal. VENOUS SINUSES: As permitted by contrast timing, patent. ANATOMIC VARIANTS: Fetal origins of both posterior cerebral arteries. Review of the MIP images confirms the above findings. IMPRESSION: 1. No intracranial arterial occlusion or high-grade stenosis. 2. Abnormal appearance of the right vertebral artery at the V2 V3 junction with return of opacification at the V4 segment. This could indicate a vertebral dissection. 3. Ground-glass opacity in the left upper lobe, which may indicate aspiration. These results were called by telephone at the time of interpretation on 09/15/2021 at 12:27 am to provider [facility] , who verbally acknowledged these results. Electronically Signed   By: Deatra Robinson M.D.   On: 09/15/2021 00:28    Labs:  CBC: Recent Labs    09/14/21 0002 09/15/21 0009 09/15/21 0602 09/15/21 0612 09/16/21 0348  WBC 15.2*  --  11.7*  --  10.7*  HGB 14.4 15.0 15.2 15.6 14.2  HCT 43.7 44.0 45.4 46.0 42.5  PLT 211  --  183  --  190    COAGS: Recent Labs    09/14/21 0002  INR 1.0  APTT 25    BMP: Recent Labs    09/14/21 0002 09/15/21 0009 09/15/21 0602 09/15/21 0612 09/16/21 0348  NA 138 139 137 138 134*  K 3.1* 3.1* 3.4* 3.5 3.4*  CL 103 101 103  --  101  CO2 25  --  24  --  27  GLUCOSE 169* 166* 132*  --  117*  BUN 12 14 8   --  7  CALCIUM 8.5*  --  8.8*  --  8.4*  CREATININE 1.01 0.90 0.76  --  0.90  GFRNONAA >60  --  >60  --  >60    LIVER FUNCTION TESTS: Recent Labs    09/14/21 0002 09/16/21 0348  BILITOT 0.9 1.3*  AST 56* 30  ALT 54* 39  ALKPHOS 67 64  PROT 6.6 6.2*  ALBUMIN 3.8 3.3*    Assessment and Plan: 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  Pt resting in bed. He c/o continuing hiccups and some dizziness while sitting up in bed. He adds he has no appetite.  He denies blurred vision, headache.   Alert, aware and oriented  X 3 Speech clear and comprehension is intact.  PERRLA bilaterally No facial droop noted Can spontaneously move all 4 extremities. 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

## 2021-09-16 NOTE — Plan of Care (Signed)
  Problem: Education: Goal: Knowledge of disease or condition will improve Outcome: Progressing   

## 2021-09-16 NOTE — Progress Notes (Addendum)
Neurology on call informed that pt systolic blood pressures have been outside the goal of 120-140 systolic. See new orders.  ?

## 2021-09-16 NOTE — Progress Notes (Addendum)
STROKE TEAM PROGRESS NOTE  ? ?INTERVAL HISTORY ?Patient is seen in his room with no family at the bedside.  He has been hemodynamically stable and his neurological exam is stable.  He is ready to transfer out of the ICU.  Cardiology consulted for rhythm control options for atrial fibrillation. ? ?Vitals:  ? 09/16/21 1000 09/16/21 1100 09/16/21 1200 09/16/21 1300  ?BP: 131/86 (!) 131/91 (!) 144/109 (!) 125/99  ?Pulse: 95 80 (!) 102 (!) 117  ?Resp: (!) 28 18 19  (!) 23  ?Temp:      ?TempSrc:      ?SpO2: 97% 96% 93% 95%  ?Weight:      ?Height:      ? ?CBC:  ?Recent Labs  ?Lab 09/15/21 ?0602 09/15/21 ?0612 09/16/21 ?0348  ?WBC 11.7*  --  10.7*  ?NEUTROABS 10.0*  --  8.0*  ?HGB 15.2 15.6 14.2  ?HCT 45.4 46.0 42.5  ?MCV 82.7  --  83.2  ?PLT 183  --  190  ? ? ?Basic Metabolic Panel:  ?Recent Labs  ?Lab 09/15/21 ?0602 09/15/21 ?0612 09/16/21 ?0348  ?NA 137 138 134*  ?K 3.4* 3.5 3.4*  ?CL 103  --  101  ?CO2 24  --  27  ?GLUCOSE 132*  --  117*  ?BUN 8  --  7  ?CREATININE 0.76  --  0.90  ?CALCIUM 8.8*  --  8.4*  ? ? ?Lipid Panel:  ?Recent Labs  ?Lab 09/15/21 ?0605  ?CHOL 192  ?TRIG 86  ?HDL 45  ?CHOLHDL 4.3  ?VLDL 17  ?LDLCALC 130*  ? ? ?HgbA1c:  ?Recent Labs  ?Lab 09/15/21 ?0605  ?HGBA1C 5.4  ? ? ?Urine Drug Screen:  ?Recent Labs  ?Lab 09/15/21 ?0030  ?LABOPIA NONE DETECTED  ?COCAINSCRNUR NONE DETECTED  ?LABBENZ NONE DETECTED  ?AMPHETMU NONE DETECTED  ?THCU NONE DETECTED  ?LABBARB NONE DETECTED  ? ?  ?Alcohol Level  ?Recent Labs  ?Lab 09/14/21 ?0004  ?ETH <10  ? ? ? ?IMAGING past 24 hours ?MR BRAIN WO CONTRAST ? ?Result Date: 09/16/2021 ?CLINICAL DATA:  Stroke follow-up EXAM: MRI HEAD WITHOUT CONTRAST TECHNIQUE: Multiplanar, multiecho pulse sequences of the brain and surrounding structures were obtained without intravenous contrast. COMPARISON:  09/15/2021 FINDINGS: Brain: Unchanged multifocal right cerebellar early subacute ischemia. 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 No new site of ischemia. No acute or  chronic hemorrhage. Normal white matter signal, parenchymal volume and CSF spaces. The midline structures are normal. Vascular: Major flow voids are preserved. Skull and upper cervical spine: Normal calvarium and skull base. Visualized upper cervical spine and soft tissues are normal. Sinuses/Orbits:No paranasal sinus fluid levels or advanced mucosal thickening. No mastoid or middle ear effusion. Normal orbits. IMPRESSION: 1. 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 No hemorrhage or mass effect. 2. Unchanged multifocal right cerebellar early subacute ischemia. Electronically Signed   By: 09/17/2021 M.D.   On: 09/16/2021 03:42  ? ?ECHOCARDIOGRAM COMPLETE ? ?Result Date: 09/16/2021 ?   ECHOCARDIOGRAM REPORT   Patient Name:   David Shields Date of Exam: 09/16/2021 Medical Rec #:  09/18/2021       Height:       72.0 in Accession #:    [id number]      Weight:       205.0 lb Date of Birth:  [birth year]        BSA:          2.153 m? Patient Age:    31 years  BP:           116/86 mmHg Patient Gender: M               HR:           88 bpm. Exam Location:  Inpatient Procedure: 2D Echo, Cardiac Doppler and Color Doppler Indications:    Stroke  History:        Patient has no prior history of Echocardiogram examinations.                 Arrythmias:Atrial Fibrillation; Risk Factors:Former Smoker.  Sonographer:    Ross LudwigArthur Guy RDCS (AE) Referring Phys: [id number] [facility] LP[facility] KHALIQDINA IMPRESSIONS  1. No LVOT obstruction. No SAM. Left ventricular ejection fraction, by estimation, is 50 to 55%. The left ventricle has low normal function. The left ventricle has no regional wall motion abnormalities. 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 Left ventricular diastolic parameters are indeterminate.  2. Right ventricular systolic function is normal. The right ventricular size is normal.  3. The mitral valve is grossly normal. No evidence of mitral valve regurgitation.  4. The aortic valve is normal in  structure. Aortic valve regurgitation is not visualized.  5. Aortic small root aneurysm.  6. The inferior vena cava is normal in size with greater than 50% respiratory variability, suggesting right atrial pressure of 3 mmHg. Comparison(s): No prior Echocardiogram. Conclusion(s)/Recommendation(s): With degree of LV thickness, recommend Cardiac MRI. FINDINGS  Left Ventricle: No LVOT obstruction. No SAM. Left ventricular ejection fraction, by estimation, is 50 to 55%. The left ventricle has low normal function. The left ventricle has no regional wall motion abnormalities. The left ventricular internal cavity size was normal in size. 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 Left ventricular diastolic parameters are indeterminate. Right Ventricle: The right ventricular size is normal. No increase in right ventricular wall thickness. Right ventricular systolic function is normal. Left Atrium: Left atrial size was normal in size. Right Atrium: Right atrial size was normal in size. Pericardium: Trivial pericardial effusion is present. Mitral Valve: The mitral valve is grossly normal. No evidence of mitral valve regurgitation. Tricuspid Valve: The tricuspid valve is grossly normal. Tricuspid valve regurgitation is not demonstrated. Aortic Valve: The aortic valve is normal in structure. Aortic valve regurgitation is not visualized. Aortic valve mean gradient measures 3.0 mmHg. Aortic valve peak gradient measures 4.1 mmHg. Aortic valve area, by VTI measures 2.60 cm?. Pulmonic Valve: The pulmonic valve was not well visualized. Pulmonic valve regurgitation is not visualized. Aorta: Small root aneurysm. Venous: The inferior vena cava is normal in size with greater than 50% respiratory variability, suggesting right atrial pressure of 3 mmHg. IAS/Shunts: No atrial level shunt detected by color flow Doppler.  LEFT VENTRICLE PLAX 2D LVIDd:         4.90 cm LVIDs:         3.60 cm LV PW:         1.20 cm LV IVS:         1.60 cm LVOT diam:     2.10 cm LV SV:         46 LV SV Index:   21 LVOT Area:     3.46 cm?  RIGHT VENTRICLE             IVC RV Basal diam:  2.60 cm     IVC diam: 1.30 cm RV S prime:     13.20 cm/s TAPSE (M-mode): 1.6 cm LEFT ATRIUM  Index        RIGHT ATRIUM           Index LA diam:      2.90 cm 1.35 cm/m?   RA Area:     12.50 cm? LA Vol (A2C): 20.1 ml 9.34 ml/m?   RA Volume:   27.70 ml  12.87 ml/m? LA Vol (A4C): 31.9 ml 14.82 ml/m?  AORTIC VALVE AV Area (Vmax):    2.67 cm? AV Area (Vmean):   2.51 cm? AV Area (VTI):     2.60 cm? AV Vmax:           101.00 cm/s AV Vmean:          80.600 cm/s AV VTI:            0.177 m AV Peak Grad:      4.1 mmHg AV Mean Grad:      3.0 mmHg LVOT Vmax:         77.80 cm/s LVOT Vmean:        58.300 cm/s LVOT VTI:          0.133 m LVOT/AV VTI ratio: 0.75  AORTA Ao Root diam: 4.20 cm Ao Asc diam:  3.40 cm  SHUNTS Systemic VTI:  0.13 m Systemic Diam: 2.10 cm Carolan Clines Electronically signed by Carolan Clines Signature Date/Time: 09/16/2021/12:43:23 PM    Final    ? ?PHYSICAL EXAM ?General:  Alert, well-developed, well-nourished patient in no acute distress ?Respiratory: Regular, unlabored respirations on supplemental O2 via [place] ? ?NEURO:  ?Mental Status: AA&Ox3  ?Speech/Language: speech is without dysarthria or aphasia.  Naming, repetition, fluency, and comprehension intact. ? ?Cranial Nerves:  ?II: PERRL. Visual fields full.  ?III, IV, VI: EOMI  ?V: Sensation is intact to light touch and symmetrical to face.  ?VII: Smile is symmetrical.   ?VIII: hearing intact to voice. ?IX, X: Phonation is normal.  ?XII: tongue is midline without fasciculations. ?Motor: 5/5 strength to all muscle groups tested.  ?Sensation- Intact to light touch bilaterally.  ?Coordination: FTN intact bilaterally, HKS: no ataxia in BLE.No drift.  ?Gait- deferred ? ? ?ASSESSMENT/PLAN ?Mr. David Shields is a 31 y.o. male with history of anxiety presenting with right sided ataxia, right sided weakness, inability to  walk, dysconjugate gaze, left gaze preference, headache and vomiting. He was given TNK to treat possible stroke, but symptoms worsened.  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

## 2021-09-16 NOTE — Progress Notes (Signed)
3 ? ?NAME:  David Shields, MRN:  [id number], DOB:  02-Feb-1991, LOS: 1 ?ADMISSION DATE:  09/14/2021, CONSULTATION DATE:  3/18 ?REFERRING MD:  Estanislado Pandy, CHIEF COMPLAINT:  Post stroke management  ? 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  Given TNKASE, symptoms worsened including change in mental status and he went to IR for coiling of R vertebral artery at V3-4.  Required intubation for airway protection. ? ?Pertinent  Medical History  ?Anxiety ? 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  Given TNKASE, symptoms worsened including change in mental status and he went to IR for coiling of R vertebral artery at V3-4.  Required intubation for airway protection. ?3/18 extubated, passed swallow screen ? ?Interim History / Subjective:  ?Speech is clear and coherent ?On room air ?For echo today ?Passed SLP evaluation ?Says he was supposed to have a sleep study, never happened ? ?Objective   ?Blood pressure 116/86, pulse 87, temperature 98.2 ?F (36.8 ?C), temperature source Oral, resp. rate (!) 23, height 6' (1.829 m), weight 93 kg, SpO2 96 %. ?   ?   ? ?Intake/Output Summary (Last 24 hours) at 09/16/2021 0953 ?Last data filed at 09/16/2021 0900 ?Gross per 24 hour  ?Intake 116.95 ml  ?Output 2020 ml  ?Net -1903.05 ml  ? ?Filed Weights  ? 09/14/21 2300  ?Weight: 93 kg  ? ? ?Examination: ? ?General:  Resting comfortably in bed ?HENT: NCAT OP clear ?PULM: CTA B, normal effort ?CV: RRR, no mgr ?GI: BS+, soft, nontender ?MSK: normal bulk and tone ?Neuro: awake, alert, no distress, MAEW ? ?Resolved Hospital Problem list   ? ? 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 ?Discussion: ?Despite his stroke, he seems to be doing well.  No difficulty breathing at this point.  New onset atrial fibrillation may very well be due to undiagnosed Obstructive sleep apnea. ? ?Plan ?Will transfer out of ICU ?Needs tele ?Consider cardiology consult ?Would recommend outpatient sleep study ? ?PCCM available prn ? ? ?Best Practice (right click and "Reselect all SmartList Selections" daily)  ? ?Diet/type: Regular consistency (see orders) ?DVT prophylaxis: SCD ?GI prophylaxis: N/A and PPI ?Lines: N/A ?Foley:  N/A ?Code Status:  full code ?Last date of multidisciplinary goals of care discussion [per stroke] ? ?Labs   ?CBC: ?Recent Labs  ?Lab 09/14/21 ?0002 09/15/21 ?0009 09/15/21 ?0602 09/15/21 ?[id number] 09/16/21 ?[id number]  ?WBC 15.2*  --  11.7*  --  10.7*  ?NEUTROABS 6.1  --  10.0*  --  8.0*  ?HGB 14.4 15.0 15.2 15.6 14.2  ?HCT 43.7 44.0 45.4 46.0 42.5  ?MCV 86.0  --  82.7  --  83.2  ?PLT 211  --  183  --  190  ? ? ?Basic Metabolic Panel: ?Recent Labs  ?Lab 09/14/21 ?0002 09/15/21 ?0009 09/15/21 ?0602 09/15/21 ?[id number] 09/16/21 ?[id number]  ?NA 138 139 137 138 134*  ?K 3.1* 3.1* 3.4* 3.5 3.4*  ?CL 103 101 103  --  101  ?CO2 25  --  24  --  27  ?GLUCOSE 169* 166* 132*  --  117*  ?BUN 12 14 8   --  7  ?CREATININE 1.01 0.90 0.76  --  0.90  ?CALCIUM 8.5*  --  8.8*  --  8.4*  ? ?GFR: ?Estimated Creatinine Clearance: 132.9 mL/min (by C-G formula based on SCr of 0.9 mg/dL). ?Recent Labs  ?Lab 09/14/21 ?0002 09/15/21 ?0602 09/16/21 ?[id number]  ?WBC 15.2* 11.7* 10.7*  ? ? ?Liver Function Tests: ?Recent Labs  ?Lab 09/14/21 ?0002 09/16/21 ?[id number]  ?AST 56* 30  ?ALT 54* 39  ?ALKPHOS 67 64  ?BILITOT 0.9 1.3*  ?PROT 6.6 6.2*  ?ALBUMIN 3.8 3.3*  ? ?No results for  input(s): LIPASE, AMYLASE in the last 168 hours. ?No results for input(s): AMMONIA in the last 168 hours. ? ?ABG ?   ?Component Value Date/Time  ? PHART 7.409 09/15/2021 0612  ? PCO2ART 38.8 09/15/2021 0612  ? PO2ART 100 09/15/2021 0612  ? HCO3 24.5 09/15/2021 0612  ? TCO2 26 09/15/2021 0612  ? O2SAT 98 09/15/2021 0612  ?  ? ?Coagulation Profile: ?Recent Labs  ?Lab 09/14/21 ?0002  ?INR 1.0  ? ? ?Cardiac Enzymes: ?No results for input(s): CKTOTAL, CKMB, CKMBINDEX, TROPONINI in the last 168 hours. ? ?HbA1C: ?Hgb A1c MFr Bld  ?Date/Time Value Ref Range Status  ?09/15/2021 06:05 AM 5.4 4.8 - 5.6 % Final  ?  Comment:  ?  (NOTE) ?Pre diabetes:          5.7%-6.4% ? ?Diabetes:              >6.4% ? ?Glycemic control for   <7.0% ?adults with diabetes ?  ? ? ?CBG: ?No results for input(s): GLUCAP in the last 168 hours. ? ?  ? ?Critical care time: n/a minutes ?  ? ?Roselie Awkward, MD ?[facility] ?Pager: [phone] ?Cell: [phone] ?After 7:00 pm call Elink  [phone] ? ? ? ? ?

## 2021-09-16 NOTE — Progress Notes (Signed)
?  Echocardiogram ?2D Echocardiogram has been performed. ? ?David Shields ?09/16/2021, 10:25 AM ?

## 2021-09-16 NOTE — Evaluation (Signed)
Occupational Therapy Evaluation and Discharge ?Patient Details ?Name: David Shields ?MRN: [id number] ?DOB: 1991/04/06 ?Today's Date: 09/16/2021 ? ? ?History of Present Illness David Shields is a 31 y.o. male who went to bed around 2100 3/17, woke up an hour later and fell and he was noted to have ataxia, dysconjugate gaze and L gaze preference, headache and vomiting. MRI showed R cerebellar CVA. Pt taken to IR and found to have dissected R vertebral artery and underwent coiling.  Pt received TNK. PMH: anxiety  ? ?Clinical Impression ?  ?This 31 yo male admitted with above presents to acute OT with all education completed with pt and family. Pt does have balance deficits with dynamic activities--recommended to him and family that he have a shower seat for safety-they verbalized understanding. Acute OT will sign off.  ?   ? ?Recommendations for follow up therapy are one component of a multi-disciplinary discharge planning process, led by the attending physician.  Recommendations may be updated based on patient status, additional functional criteria and insurance authorization.  ? ?Follow Up Recommendations ? No OT follow up  ?  ?Assistance Recommended at Discharge Frequent or constant Supervision/Assistance  ?Patient can return home with the following Assistance with cooking/housework;Assist for transportation ? ?  ?Functional Status Assessment ? Patient has had a recent decline in their functional status and demonstrates the ability to make significant improvements in function in a reasonable and predictable amount of time. (no further skilled OT needs identfied--needs follow up PT and time)  ?Equipment Recommendations ?  (family to look into getting a tub seat)  ?  ?   ?Precautions / Restrictions Precautions ?Precautions: Fall ?Precaution Comments: watch HR, new afib ?Restrictions ?Weight Bearing Restrictions: No  ? ?  ? ?Mobility Bed Mobility ?Overal bed mobility: Independent ?  ?  ?  ?  ?  ?  ?  ?   ? ?Transfers ?Overall transfer level: Independent ?Equipment used: None ?Transfers: Sit to/from Stand, Bed to chair/wheelchair/BSC ?Sit to Stand: Supervision ?Stand pivot transfers: Supervision ?  ?  ?  ?  ?  ?  ? ?  ?Balance Overall balance assessment: Mild deficits observed, not formally tested ?  ?  ?  ?  ?  ?  ?  ?  ?  ?  ?  ?  ?  ?  ?  ?  ?  ?  ?   ? ?ADL either performed or assessed with clinical judgement  ? ?ADL Overall ADL's : Needs assistance/impaired ?  ?  ?  ?  ?  ?  ?  ?  ?  ?  ?  ?  ?  ?  ?  ?  ?  ?  ?  ?General ADL Comments: Overall at a S level. Needs to take time and go slow due to can get off balance if he goes to fast or turns his head, looks up while moving at same time. Discussed with pt and family that a shower seat is recommended--they are looking into getting one.  ? ? ? ?Vision Patient Visual Report: No change from baseline ?   ?   ?   ?   ? ?Pertinent Vitals/Pain Pain Assessment ?Pain Assessment: No/denies pain  ? ? ? ?Hand Dominance Right ?  ?Extremity/Trunk Assessment Upper Extremity Assessment ?Upper Extremity Assessment: LUE deficits/detail ?LUE Deficits / Details: old shoulder injury ?  ?  ?  ?  ?  ?Communication Communication ?Communication: No difficulties ?  ?Cognition Arousal/Alertness: Awake/alert ?Behavior During  Therapy: WFL for tasks assessed/performed ?Overall Cognitive Status: Within Functional Limits for tasks assessed ?  ?  ?  ?  ?  ?  ?  ?  ?  ?  ?  ?  ?  ?  ?  ?  ?  ?  ?  ?   ?   ?   ? ? ?Home Living Family/patient expects to be discharged to:: Private residence ?Living Arrangements: Spouse/significant other;Children ?Available Help at Discharge: Family;Available 24 hours/day ?Type of Home: Apartment ?Home Access: Stairs to enter ?Entrance Stairs-Number of Steps: flight ?  ?Home Layout: One level ?  ?  ?Bathroom Shower/Tub: Tub/shower unit ?  ?Bathroom Toilet: Standard ?  ?  ?Home Equipment: None ?  ?Additional Comments: has 4 kids (3 live in the home) ? Lives With:  Spouse ? ?  ?Prior Functioning/Environment Prior Level of Function : Independent/Modified Independent;Working/employed;Driving ?  ?  ?  ?  ?  ?  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  ?  ? ?  ?  ?OT Problem List: Impaired balance (sitting and/or standing) ?  ?   ? ?AM-PAC OT "6 Clicks" Daily Activity     ?Outcome Measure Help from another person eating meals?: None ?Help from another person taking care of personal grooming?: A Little ?Help from another person toileting, which includes using toliet, bedpan, or urinal?: A Little ?Help from another person bathing (including washing, rinsing, drying)?: A Little ?Help from another person to put on and taking off regular upper body clothing?: A Little ?Help from another person to put on and taking off regular lower body clothing?: A Little ?6 Click Score: 19 ?  ?End of Session Equipment Utilized During Treatment:  (none) ? ?Activity Tolerance: Patient tolerated treatment well ?Patient left: in chair;with call bell/phone within reach;with family/visitor present ? ?OT Visit Diagnosis: Unsteadiness on feet (R26.81)  ?              ?Time: 6283-6629 ?OT Time Calculation (min): 22 min ?Charges:  OT General Charges ?$OT Visit: 1 Visit ?OT Evaluation ?$OT Eval Moderate Complexity: 1 Mod ?David Shields, OTR/L ?Acute Rehab Services ?Pager [phone] ?Office [phone] ? ? ? ?David Shields ?09/16/2021, 2:20 PM ?

## 2021-09-16 NOTE — Progress Notes (Signed)
Physical Therapy Treatment ?Patient Details ?Name: David Shields ?MRN: [id number] ?DOB: February 21, 1991 ?Today's Date: 09/16/2021 ? ? ?History of Present Illness David Shields is a 31 y.o. male who went to bed around 2100 3/17, woke up an hour later and fell and he was noted to have ataxia, dysconjugate gaze and L gaze preference, headache and vomiting. MRI showed R cerebellar CVA. Pt taken to IR and found to have dissected R vertebral artery and underwent coiling.  Pt received TNK. PMH: anxiety ? ?  ?PT Comments  ? ? The pt was able to make great progress with OOB mobility and ambulation today, but continues to have short duration of HR elevation with initial movements (max 162bpm) but generally maintained 120-145bpm with hallway ambulation. The pt was able to complete ~300 ft hallway ambulation with no AD and minG for safety, but requires up to minA to steady and multiple staggering steps to regain balance with addition of head turns or changes in direction. Therefore, updated recommendation to OP neuro follow up PT, will continue to work acutely on dynamic stability and independence with gait.  ?  ?Recommendations for follow up therapy are one component of a multi-disciplinary discharge planning process, led by the attending physician.  Recommendations may be updated based on patient status, additional functional criteria and insurance authorization. ? ?Follow Up Recommendations ? Outpatient PT (OP neuro) ?  ?  ?Assistance Recommended at Discharge None  ?Patient can return home with the following Assist for transportation ?  ?Equipment Recommendations ? None recommended by PT  ?  ?Recommendations for Other Services   ? ? ?  ?Precautions / Restrictions Precautions ?Precautions: Fall ?Precaution Comments: watch HR, new afib ?Restrictions ?Weight Bearing Restrictions: No  ?  ? ?Mobility ? Bed Mobility ?  ?  ?  ?  ?  ?  ?  ?General bed mobility comments: pt OOB in recliner upon arrival ?  ? ?Transfers ?Overall transfer  level: Needs assistance ?Equipment used: None ?Transfers: Sit to/from Stand ?Sit to Stand: Supervision ?  ?  ?  ?  ?  ?General transfer comment: supervision, no assist to steady in standing HR to 160bpm with initial stand then returned to 120s ?  ? ?Ambulation/Gait ?Ambulation/Gait assistance: Min guard, Min assist ?Gait Distance (Feet): 300 Feet ?Assistive device: None ?Gait Pattern/deviations: Step-through pattern, Scissoring, Ataxic, Staggering left, Staggering right ?Gait velocity: decreased ?  ?  ?General Gait Details: pt mostly steady with normal gait, minA to steady and increased staggering steps with any balance challenge ? ? ?  ? ?Modified Rankin (Stroke Patients Only) ?Modified Rankin (Stroke Patients Only) ?Pre-Morbid Rankin Score: No symptoms ?Modified Rankin: Slight disability ? ? ?  ?Balance Overall balance assessment: Needs assistance ?  ?  ?  ?  ?  ?  ?  ?  ?  ?  ?  ?  ?  ?High level balance activites: Direction changes, Turns, Sudden stops, Head turns ?High Level Balance Comments: pt needing up to minA to steady with head turns, multiple staggering steps to recover ?Standardized Balance Assessment ?Standardized Balance Assessment : Dynamic Gait Index ?  ?Dynamic Gait Index ?Level Surface: Normal ?Change in Gait Speed: Normal ?Gait with Horizontal Head Turns: Moderate Impairment ?Gait with Vertical Head Turns: Moderate Impairment ?Gait and Pivot Turn: Mild Impairment ?Step Over Obstacle: Mild Impairment ?Step Around Obstacles: Normal ?Steps: Mild Impairment ?Total Score: 17 ?  ? ?  ?Cognition Arousal/Alertness: Awake/alert ?Behavior During Therapy: [facility] for tasks assessed/performed ?Overall Cognitive Status: Within Functional  Limits for tasks assessed ?  ?  ?  ?  ?  ?  ?  ?  ?  ?  ?  ?  ?  ?  ?  ?  ?General Comments: pt reports he deals with anxiety ?  ?  ? ?  ?Exercises   ? ?  ?General Comments General comments (skin integrity, edema, etc.): HR to 160 initially with standing, then returned to 120s.  120-145bpm with gait ?  ?  ? ?Pertinent Vitals/Pain Pain Assessment ?Pain Assessment: No/denies pain  ? ? ?Home Living Family/patient expects to be discharged to:: Private residence ?Living Arrangements: Spouse/significant other;Children ?Available Help at Discharge: Family;Available 24 hours/day ?Type of Home: Apartment ?Home Access: Stairs to enter ?  ?Entrance Stairs-Number of Steps: flight ?  ?Home Layout: One level ?Home Equipment: None ?Additional Comments: has 4 kids (3 live in the home)  ?  ?Prior Function    ?  ?  ?   ? ?PT Goals (current goals can now be found in the care plan section) Acute Rehab PT Goals ?Patient Stated Goal: get back to normal ?PT Goal Formulation: With patient ?Time For Goal Achievement: 09/29/21 ?Potential to Achieve Goals: Good ?Progress towards PT goals: Progressing toward goals ? ?  ?Frequency ? ? ? Min 4X/week ? ? ? ?  ?PT Plan Discharge plan needs to be updated  ? ? ?   ?AM-PAC PT "6 Clicks" Mobility   ?Outcome Measure ? Help needed turning from your back to your side while in a flat bed without using bedrails?: None ?Help needed moving from lying on your back to sitting on the side of a flat bed without using bedrails?: None ?Help needed moving to and from a bed to a chair (including a wheelchair)?: None ?Help needed standing up from a chair using your arms (e.g., wheelchair or bedside chair)?: None ?Help needed to walk in hospital room?: A Little ?Help needed climbing 3-5 steps with a railing? : A Little ?6 Click Score: 22 ? ?  ?End of Session Equipment Utilized During Treatment: Gait belt ?Activity Tolerance: Patient tolerated treatment well ?Patient left: with call bell/phone within reach;in chair;with family/visitor present ?Nurse Communication: Mobility status ?PT Visit Diagnosis: Unsteadiness on feet (R26.81) ?  ? ? ?Time: [id number] ?PT Time Calculation (min) (ACUTE ONLY): 16 min ? ?Charges:  $Therapeutic Exercise: 8-22 mins          ?          ? ?West Carbo, PT, DPT   ? ?Acute Rehabilitation Department ?Pager #: [phone] - [phone] ? ? ?Sandra Cockayne ?09/16/2021, 2:28 PM ? ?

## 2021-09-16 NOTE — Anesthesia Postprocedure Evaluation (Addendum)
Anesthesia Post Note ? ?Patient: David Shields ? ?Procedure(s) Performed: IR WITH ANESTHESIA ? ?  ? ?Patient location during evaluation: SICU ?Anesthesia Type: General ?Level of consciousness: sedated ?Pain management: pain level controlled ?Vital Signs Assessment: post-procedure vital signs reviewed and stable ?Respiratory status: patient remains intubated per anesthesia plan ?Cardiovascular status: stable ?Postop Assessment: no apparent nausea or vomiting ?Anesthetic complications: no ? ? ?No notable events documented. ? ?Last Vitals:  ?Vitals:  ? 09/16/21 0600 09/16/21 0700  ?BP: 99/70 95/73  ?Pulse: 85 83  ?Resp: 18 18  ?Temp:    ?SpO2: 94% 95%  ?  ?Last Pain:  ?Vitals:  ? 09/16/21 0404  ?TempSrc: Oral  ?PainSc:   ? ? ?  ?  ?  ?  ?  ?  ? ?Denim Start S ? ? ? ? ?

## 2021-09-17 ENCOUNTER — Inpatient Hospital Stay: Payer: Self-pay

## 2021-09-17 ENCOUNTER — Other Ambulatory Visit: Payer: Self-pay | Admitting: Physician Assistant

## 2021-09-17 ENCOUNTER — Encounter: Payer: Self-pay | Admitting: *Deleted

## 2021-09-17 ENCOUNTER — Other Ambulatory Visit: Payer: Self-pay | Admitting: Radiology

## 2021-09-17 ENCOUNTER — Encounter (HOSPITAL_COMMUNITY): Payer: Self-pay | Admitting: Interventional Radiology

## 2021-09-17 ENCOUNTER — Encounter: Payer: Self-pay | Admitting: Neurology

## 2021-09-17 DIAGNOSIS — I480 Paroxysmal atrial fibrillation: Secondary | ICD-10-CM

## 2021-09-17 DIAGNOSIS — I7774 Dissection of vertebral artery: Secondary | ICD-10-CM

## 2021-09-17 DIAGNOSIS — I639 Cerebral infarction, unspecified: Secondary | ICD-10-CM

## 2021-09-17 LAB — BASIC METABOLIC PANEL
Anion gap: 6 (ref 5–15)
BUN: 6 mg/dL (ref 6–20)
CO2: 26 mmol/L (ref 22–32)
Calcium: 8.9 mg/dL (ref 8.9–10.3)
Chloride: 105 mmol/L (ref 98–111)
Creatinine, Ser: 0.95 mg/dL (ref 0.61–1.24)
GFR, Estimated: >60 mL/min (ref >60)
Glucose, Bld: 112 mg/dL — ABNORMAL HIGH (ref 70–99)
Potassium: 3.8 mmol/L (ref 3.5–5.1)
Sodium: 137 mmol/L (ref 135–145)

## 2021-09-17 LAB — MAGNESIUM: Magnesium: 2.2 mg/dL (ref 1.7–2.4)

## 2021-09-17 MED ORDER — ATORVASTATIN CALCIUM 80 MG PO TABS: 80.0000 mg | ORAL_TABLET | Freq: Every day | ORAL | 2 refills | Status: DC

## 2021-09-17 MED ORDER — METOPROLOL TARTRATE 25 MG PO TABS: 25.0000 mg | ORAL_TABLET | Freq: Two times a day (BID) | ORAL | 2 refills | Status: DC

## 2021-09-17 MED ORDER — CLOPIDOGREL BISULFATE 75 MG PO TABS: 75.0000 mg | ORAL_TABLET | Freq: Every day | ORAL | 2 refills | Status: DC

## 2021-09-17 MED ORDER — ASPIRIN 81 MG PO CHEW
81.0000 mg | CHEWABLE_TABLET | Freq: Every day | ORAL | Status: DC
Start: 2021-09-18 — End: 2022-05-27

## 2021-09-17 NOTE — Progress Notes (Unsigned)
Enrolled for [employer] to mail a ZIO AT Live Telemetry monitor to patients address on file.  ? ?Letter with instructions mailed to patient ? ?Dr. Elease Hashimoto to read. ?

## 2021-09-17 NOTE — Discharge Instructions (Signed)
Call the [facility] in [place] to arrange for an appointment. ?

## 2021-09-17 NOTE — Discharge Summary (Addendum)
Patient ID: David Shields    l ?  MRN: [id number]    ?  DOB: [birth year] ? ?Date of Admission: 09/14/2021 ?Date of Discharge: 09/17/2021 ? ?Attending Physician:  Stroke, Md, MD, Stroke MD ?Consultant(s):    Cardiology -     ; Zachary,  MD ; Neuro Interventional Radiology - Luanne Bras, MD ? ?Patient's PCP:  Patient, No Pcp Per (Inactive) ? 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 ? ?Past Medical History:  ?Diagnosis Date  ? Anxiety   ? ?Past Surgical History:  ?Procedure Laterality Date  ? ADENOIDECTOMY    ? RADIOLOGY WITH ANESTHESIA N/A 09/15/2021  ? Procedure: IR WITH ANESTHESIA;  Surgeon: Luanne Bras, MD;  Location: [facility];  Service: Radiology;  Laterality: N/A;  ? ? ?Family History ?Family History  ?Problem Relation Age of Onset  ? Depression Mother   ? Cancer Paternal Uncle   ? Cancer Maternal Grandmother   ?     breast   ? Hypertension Maternal Grandmother   ? ? ?Social History ? reports that he has been smoking cigarettes and cigars. He has been smoking an average of .5 packs per day. He has never used smokeless tobacco. He reports current alcohol use. He reports that he does not use drugs. ? ?Allergies as of 09/17/2021   ? ?   Reactions  ? Banana Hives, Shortness Of Breath, Itching  ? ?  ? ?  ?Medication List  ?  ? ?STOP taking these medications   ? ?ondansetron 4 MG tablet ?Commonly known as: ZOFRAN ?  ?ondansetron 8 MG disintegrating tablet ?Commonly known as: ZOFRAN-ODT ?  ?promethazine 25 MG suppository ?Commonly known as: PHENERGAN ?  ?promethazine 25 MG tablet ?Commonly known as: PHENERGAN ?  ?ranitidine 150 MG tablet ?Commonly known as: ZANTAC ?  ? ?  ? ?TAKE these medications   ? ?aspirin 81 MG chewable tablet ?Chew 1 tablet (81 mg total) by mouth daily. ?Start taking on: September 18, 2021 ?   ?atorvastatin 80 MG tablet ?Commonly known as: LIPITOR ?Take 1 tablet (80 mg total) by mouth daily. ?Start taking on: September 18, 2021 ?  ?clopidogrel 75 MG tablet ?Commonly known as: PLAVIX ?Take 1 tablet (75 mg total) by mouth daily. ?Start taking on: September 18, 2021 ?  ?metoprolol tartrate 25 MG tablet ?Commonly known as: LOPRESSOR ?Take 1 tablet (25 mg total) by mouth 2 (two) times daily. ?  ? ?  ? ? ?[place] ?Medications Prior to Admission  ?Medication Sig Dispense Refill  ? ondansetron (ZOFRAN) 4 MG tablet Take 1 tablet (4 mg total) by mouth every 8 (eight) hours as needed for nausea or vomiting. (Patient not taking: Reported on 03/04/2015) 6 tablet 0  ? ondansetron (ZOFRAN-ODT) 8 MG disintegrating tablet Take 1 tablet (8 mg total) by mouth every 8 (eight) hours as needed for nausea or vomiting. (Patient not taking: Reported on 09/15/2021) 10 tablet 0  ? promethazine (PHENERGAN) 25 MG suppository Place 1 suppository (25 mg total) rectally every 6 (six) hours as needed for nausea or vomiting. (Patient not taking: Reported on 09/15/2021) 12 suppository 0  ? promethazine (PHENERGAN) 25 MG tablet Take 1 tablet (25 mg total) by mouth every 6 (six) hours as needed for nausea. (Patient not taking: Reported on 09/15/2021) 10 tablet 0  ? ranitidine (ZANTAC)  150 MG tablet Take 1 tablet (150 mg total) by mouth 2 (two) times daily. (Patient not taking: Reported on 09/15/2021) 60 tablet 0  ? ? ? ?HOSPITAL MEDICATIONS ?  stroke: mapping our early stages of recovery book   Does not apply Once  ? aspirin  81 mg Oral Daily  ? atorvastatin  80 mg Oral Daily  ? Chlorhexidine Gluconate Cloth  6 each Topical Daily  ? clopidogrel  75 mg Oral Daily  ? pantoprazole  40 mg Oral Daily  ? ? ?LABORATORY STUDIES ?CBC ?   ?Component Value Date/Time  ? WBC 10.7 (H) 09/16/2021 0348  ? RBC 5.11 09/16/2021 0348  ? HGB 14.2 09/16/2021 0348  ? HCT 42.5 09/16/2021 0348  ? PLT 190 09/16/2021 0348  ? MCV 83.2 09/16/2021 0348  ?  MCH 27.8 09/16/2021 0348  ? MCHC 33.4 09/16/2021 0348  ? RDW 13.3 09/16/2021 0348  ? LYMPHSABS 1.8 09/16/2021 0348  ? MONOABS 0.8 09/16/2021 0348  ? EOSABS 0.0 09/16/2021 0348  ? BASOSABS 0.0 09/16/2021 0348  ? ?CMP ?   ?Component Value Date/Time  ? NA 137 09/17/2021 0431  ? K 3.8 09/17/2021 0431  ? CL 105 09/17/2021 0431  ? CO2 26 09/17/2021 0431  ? GLUCOSE 112 (H) 09/17/2021 0431  ? BUN 6 09/17/2021 0431  ? CREATININE 0.95 09/17/2021 0431  ? CALCIUM 8.9 09/17/2021 0431  ? PROT 6.2 (L) 09/16/2021 0348  ? ALBUMIN 3.3 (L) 09/16/2021 0348  ? AST 30 09/16/2021 0348  ? ALT 39 09/16/2021 0348  ? ALKPHOS 64 09/16/2021 0348  ? BILITOT 1.3 (H) 09/16/2021 0348  ? GFRNONAA >60 09/17/2021 0431  ? GFRAA >60 02/28/2015 1556  ? ?COAGS ?Lab Results  ?Component Value Date  ? INR 1.0 09/14/2021  ? ?Lipid Panel ?   ?Component Value Date/Time  ? CHOL 192 09/15/2021 0605  ? TRIG 86 09/15/2021 0605  ? HDL 45 09/15/2021 0605  ? CHOLHDL 4.3 09/15/2021 0605  ? VLDL 17 09/15/2021 0605  ? [place] 130 (H) 09/15/2021 [id number]  ? ?HgbA1C  ?Lab Results  ?Component Value Date  ? HGBA1C 5.4 09/15/2021  ? ?Urinalysis ?   ?Component Value Date/Time  ? COLORURINE STRAW (A) 09/15/2021 0030  ? APPEARANCEUR CLEAR 09/15/2021 0030  ? LABSPEC 1.030 09/15/2021 0030  ? PHURINE 6.0 09/15/2021 0030  ? GLUCOSEU NEGATIVE 09/15/2021 0030  ? [place] NEGATIVE 09/15/2021 0030  ? [place] NEGATIVE 09/15/2021 0030  ? [place] NEGATIVE 09/15/2021 0030  ? PROTEINUR NEGATIVE 09/15/2021 0030  ? UROBILINOGEN 0.2 03/04/2015 1441  ? NITRITE NEGATIVE 09/15/2021 0030  ? LEUKOCYTESUR NEGATIVE 09/15/2021 0030  ? ?Urine Drug Screen  ?   ?Component Value Date/Time  ? LABOPIA NONE DETECTED 09/15/2021 0030  ? COCAINSCRNUR NONE DETECTED 09/15/2021 0030  ? LABBENZ NONE DETECTED 09/15/2021 0030  ? AMPHETMU NONE DETECTED 09/15/2021 0030  ? THCU NONE DETECTED 09/15/2021 0030  ? LABBARB NONE DETECTED 09/15/2021 0030  ?  ?Alcohol Level ?   ?Component Value Date/Time  ? ETH <10 09/14/2021  0004  ? ? ? ?SIGNIFICANT DIAGNOSTIC STUDIES ? ?CT HEAD WO CONTRAST (5MM) ? ?Result Date: 09/15/2021 ?CLINICAL DATA:  Neuro deficit, acute, stroke suspected. Mental status change. EXAM: CT HEAD WITHOUT CONTRAST TECHNIQUE: Contiguous axial images were obtained from the base of the skull through the vertex without intravenous contrast. RADIATION DOSE REDUCTION: This exam was performed according to the departmental dose-optimization program which includes automated exposure control, adjustment of the mA and/or kV according to patient size and/or use  of iterative reconstruction technique. COMPARISON:  09/14/2021. FINDINGS: Brain: No acute intracranial hemorrhage, midline shift or mass effect. No extra-axial fluid collection. There is hypodense region in the cerebellum on the left, which may be artifactual. Gray-white matter differentiation is within normal limits. No hydrocephalus. Vascular: No hyperdense vessel or unexpected calcification. Skull: Normal. Negative for fracture or focal lesion. Sinuses/Orbits: Mucosal thickening is present in the maxillary sinus on the left. The orbits are unremarkable. Other: None. IMPRESSION: 1. No acute intracranial hemorrhage. 2. Hypodense region in the cerebellum on the left possible artifact, however infarct can not be excluded. MRI may be beneficial for further evaluation. Electronically Signed   By: Brett Fairy M.D.   On: 09/15/2021 02:24  ? ?MR BRAIN WO CONTRAST ? ?Result Date: 09/16/2021 ?CLINICAL DATA:  Stroke follow-up EXAM: MRI HEAD WITHOUT CONTRAST TECHNIQUE: Multiplanar, multiecho pulse sequences of the brain and surrounding structures were obtained without intravenous contrast. COMPARISON:  09/15/2021 FINDINGS: Brain: Unchanged multifocal right cerebellar early subacute ischemia. 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 No new site of ischemia. No acute or chronic hemorrhage. Normal white matter signal, parenchymal volume and CSF spaces. The midline  structures are normal. Vascular: Major flow voids are preserved. Skull and upper cervical spine: Normal calvarium and skull base. Visualized upper cervical spine and soft tissues are normal. Sinuses/Orbits:No p

## 2021-09-17 NOTE — Plan of Care (Signed)
Able to verbalize understanding ?

## 2021-09-17 NOTE — TOC Initial Note (Signed)
Transition of Care (TOC) - Initial/Assessment Note  ? ? ?Patient Details  ?Name: David Shields ?MRN: [id number] ?Date of Birth: 04-Sep-1990 ? ?Transition of Care (TOC) CM/SW Contact:    ?Pollie Friar, RN ?Phone Number: ?09/17/2021, 12:12 PM ? ?Clinical Narrative:                 ?Patient is from  home with his spouse who is with him most of the time.  ?Patient is active with [facility]. PCP: Elenora Gamma, [place]   SW: Drue Stager [phone] ext [phone] ?Patient uses [facility] for his medications except ones he needs same day. Pt not currently taking any home medications. ?Patient denies any issues with transportation.  ?Recommendations are for outpatient therapy. Pt chooses to attend at [facility]. CM is attempting to get this approved through his clinic MD.  ?TOC following. ? ?Expected Discharge Plan: OP Rehab ?Barriers to Discharge: Continued Medical Work up ? ? ?Patient Goals and CMS Choice ?  ?  ?Choice offered to / list presented to : Patient, Spouse ? ?Expected Discharge Plan and Services ?Expected Discharge Plan: OP Rehab ?  ?Discharge Planning Services: CM Consult ?  ?Living arrangements for the past 2 months: Apartment ?                ?  ?  ?  ?  ?  ?  ?  ?  ?  ?  ? ?Prior Living Arrangements/Services ?Living arrangements for the past 2 months: Apartment ?Lives with:: Spouse ?Patient language and need for interpreter reviewed:: Yes ?Do you feel safe going back to the place where you live?: Yes      ?  ?  ?  ?Criminal Activity/Legal Involvement Pertinent to Current Situation/Hospitalization: No - Comment as needed ? ?Activities of Daily Living ?  ?  ? ?Permission Sought/Granted ?  ?  ?   ?   ?   ?   ? ?Emotional Assessment ?Appearance:: Appears stated age ?Attitude/Demeanor/Rapport: Engaged ?Affect (typically observed): Accepting ?Orientation: : Oriented to Self, Oriented to Place, Oriented to  Time, Oriented to Situation ?  ?Psych Involvement: No (comment) ? 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 Diagnosis Date Noted  ? Acute ischemic multifocal posterior circulation stroke involving right-sided vessel ([place]) 09/15/2021  ? Dissection of intracranial vertebral artery ([place]) 09/15/2021  ? Vertebral artery dissection (HCC)   ? Atrial fibrillation ([place])   ? Tinea versicolor 02/01/2013  ? Insomnia 02/01/2013  ? Anxiety 09/19/2011  ? Panic attack 09/19/2011  ? Tobacco user 09/19/2011  ? ?PCP:  Patient, No Pcp Per (Inactive) ?Pharmacy:   ?[facility] [address] ?[address] ?[place] [address] ?Phone: [phone] Fax: [phone] ? ? ? ? ?Social Determinants of Health (SDOH) Interventions ?  ? ?Readmission Risk Interventions ?No flowsheet data found. ? ? ?

## 2021-09-17 NOTE — Progress Notes (Addendum)
Msg sent to office to arrange 14d Zio to be mailed to pt's home. Outlined this on AVS. Pt aware by phone. ?Also arranged f/u, info placed on AVS. ?Recommendation to consider metoprolol relayed to Dr. Erlinda Hong, awaiting his review. He will also be reviewing with Dr. Estanislado Pandy what the plan would be if the patient were to have AF detected on monitor. ?Addendum: per Dr. Erlinda Hong, "I talked with Dr. Estanislado Pandy, we will treat him with DAPT/ASA for 2-3 months and then ASA alone. but if AC needed for afib, we can stop anytime as it is coiling not stenting." ?

## 2021-09-17 NOTE — Consult Note (Addendum)
?Cardiology Consultation:  ? ?Patient ID: David Shields ?MRN: [id number]; DOB: 1990/10/05 ? ?Admit date: 09/14/2021 ?Date of Consult: 09/17/2021 ? ?PCP:  Patient, No Pcp Per (Inactive) ?  ?[facility] Providers ?Cardiologist:   Bonney Berres , new  ?  ? ? ?Patient Profile:  ? ?David Shields is a 31 y.o. male with a hx of anxiety who is being seen 09/17/2021 for the evaluation of atrial fibrillation at the request of Dr. Erlinda Hong. He was admitted with stroke suspected due to right vertebral artery dissection. ? ?History of Present Illness:  ? ?David Shields has no prior cardiac history or significant medical history aside from anxiety. He was previously in the [employer] so does recall having screening physicals. He has a history of frequent intermittent palpitations waking him from sleep but always attributed this to anxiety attacks. He does snore. He denies prior diagnosis of OSA but does state the [employer] had given him a machine to sleep with in the past. He does not use his presently. ? ?On day of admission 3/17, he went to bed at 9pm and woke up about an hour later, attempted to get out of bed, and fell. The right side of his body was numb, he felt dizzy and like he couldn't swallow. EMS was called. Upon arrival to the ED, he was found to have a L gaze preference, right sided ataxia, disconjugate gaze, headache and vomiting. CT head showed large large hypodensity concerning for a large territory infarct or hyperdensity concerning for an ICH. He was offered tNKASE but was hesitant due to risk of ICH and elected to discuss with his wife first. They decided to go forward and it was administered. Cerebral perfusion imaging showed possible right vertebral dissection. MRI of the brain showed punctate stroke in the medulla and a punctate stroke in the R cerbellum. He developed worsening neurologic symptoms early 3/18 with somnolence, R facial droop, worsening ataxia and new numbness. Repeat stat CT negative for ICH. Code IR was  called and he underwent cerebral angiogram showing dissected RT vertebral artery and occluded right vertebrobasilar junction. He underwent endovascular coiling. He was also noted to be in atrial fibrillation on arrival (rates 90s-1teens). 2D echo 09/16/21 showed EF 50-55%, severe asymmetric LVH, indeterminate diastolic function, small aortic root aneurysm. He was started on ASA 81mg  and Plavix, to consider anticoagulation pending our consultation, along with high dose statin. Overnight he appears to have converted to NSR. He does not feel specifically any different after doing so. He denies any residual effect from his stroke. Mother is at bedside and wife is on Facetime.  + occasional cigar use, occasional ETOH use (not excessively), denies illicit drug use. No family history of cardiac disease, stroke, or connective tissue disorders. VS initially showed hypotension but he does have mild hypertensive at times. ? ?Labs: intermittently hypokalemic (nadir 3.1), UDS/ETOH negative, mildly elevated AST/ALT 56/54 (improved, Tbili still 1.3), initial leukocytosis of 15.2, Covid/flu negative, LDL 130 otherwise lipids OK, A1C 5.4, TSH wnl. ? ?Past Medical History:  ?Diagnosis Date  ? Anxiety   ? ? ?Past Surgical History:  ?Procedure Laterality Date  ? ADENOIDECTOMY    ? RADIOLOGY WITH ANESTHESIA N/A 09/15/2021  ? Procedure: IR WITH ANESTHESIA;  Surgeon: Luanne Bras, MD;  Location: [facility];  Service: Radiology;  Laterality: N/A;  ?  ? ?Home Medications:  ?Prior to Admission medications   ?Medication Sig Start Date End Date Taking? Authorizing Provider  ?ondansetron (ZOFRAN) 4 MG tablet Take 1 tablet (4 mg  total) by mouth every 8 (eight) hours as needed for nausea or vomiting. ?Patient not taking: Reported on 03/04/2015 02/28/15   Francine Graven, DO  ?ondansetron (ZOFRAN-ODT) 8 MG disintegrating tablet Take 1 tablet (8 mg total) by mouth every 8 (eight) hours as needed for nausea or vomiting. ?Patient not taking: Reported  on 09/15/2021 03/02/15   Davonna Belling, MD  ?promethazine (PHENERGAN) 25 MG suppository Place 1 suppository (25 mg total) rectally every 6 (six) hours as needed for nausea or vomiting. ?Patient not taking: Reported on 09/15/2021 03/04/15   Orpah Greek, MD  ?promethazine (PHENERGAN) 25 MG tablet Take 1 tablet (25 mg total) by mouth every 6 (six) hours as needed for nausea. ?Patient not taking: Reported on 09/15/2021 03/02/15   Davonna Belling, MD  ?ranitidine (ZANTAC) 150 MG tablet Take 1 tablet (150 mg total) by mouth 2 (two) times daily. ?Patient not taking: Reported on 09/15/2021 03/04/15   Orpah Greek, MD  ? ? ?Inpatient Medications: ?Scheduled Meds: ?  stroke: mapping our early stages of recovery book   Does not apply Once  ? aspirin  81 mg Oral Daily  ? atorvastatin  80 mg Oral Daily  ? Chlorhexidine Gluconate Cloth  6 each Topical Daily  ? clopidogrel  75 mg Oral Daily  ? pantoprazole  40 mg Oral Daily  ? ?Continuous Infusions: ? cefTRIAXone (ROCEPHIN)  IV 1 g (09/17/21 [id number])  ? ?PRN Meds: ?acetaminophen **OR** acetaminophen (TYLENOL) oral liquid 160 mg/5 mL **OR** acetaminophen, alum & mag hydroxide-simeth, senna-docusate ? ?Allergies:    ?Allergies  ?Allergen Reactions  ? Banana Hives, Shortness Of Breath and Itching  ? ? ?Social History:   ?Social History  ? ?Socioeconomic History  ? Marital status: Single  ?  Spouse name: Not on file  ? Number of children: Not on file  ? Years of education: Not on file  ? Highest education level: Not on file  ?Occupational History  ? Not on file  ?Tobacco Use  ? Smoking status: Some Days  ?  Packs/day: 0.50  ?  Types: Cigarettes, Cigars  ? Smokeless tobacco: Never  ?Vaping Use  ? Vaping Use: Never used  ?Substance and Sexual Activity  ? Alcohol use: Yes  ?  Comment: occasionally  ? Drug use: No  ? Sexual activity: Not on file  ?Other Topics Concern  ? Not on file  ?Social History Narrative  ? Not on file  ? ?Social Determinants of Health  ? ?Financial  Resource Strain: Not on file  ?Food Insecurity: Not on file  ?Transportation Needs: Not on file  ?Physical Activity: Not on file  ?Stress: Not on file  ?Social Connections: Not on file  ?Intimate Partner Violence: Not on file  ?  ?Family History:   ? ?Family History  ?Problem Relation Age of Onset  ? Depression Mother   ? Cancer Paternal Uncle   ? Cancer Maternal Grandmother   ?     breast   ? Hypertension Maternal Grandmother   ?  ? ?ROS:  ?Please see the history of present illness.  ?All other ROS reviewed and negative.    ? ?Physical Exam/Data:  ? ?Vitals:  ? 09/16/21 2339 09/17/21 0346 09/17/21 0743 09/17/21 1137  ?BP: 117/86 115/81 (!) 115/91 (!) 127/94  ?Pulse: 100 98 99 99  ?Resp: 16 16 17 16   ?Temp: 98.7 ?F (37.1 ?C) 98.9 ?F (37.2 ?C) 98.4 ?F (36.9 ?C) 98.4 ?F (36.9 ?C)  ?TempSrc: Oral Oral Oral Oral  ?SpO2:  98% 98% 99% 97%  ?Weight:      ?Height:      ? ? ?Intake/Output Summary (Last 24 hours) at 09/17/2021 1451 ?Last data filed at 09/17/2021 0749 ?Gross per 24 hour  ?Intake 351 ml  ?Output --  ?Net 351 ml  ? ?Last 3 Weights 09/14/2021 12/20/2016 03/04/2015  ?Weight (lbs) 205 lb 185 lb 180 lb  ?Weight (kg) 92.987 kg 83.915 kg 81.647 kg  ?   ?Body mass index is 27.8 kg/m?.  ?General: Well developed, well nourished AAM, in no acute distress. ?Head: Normocephalic, atraumatic, sclera non-icteric, no xanthomas, nares are without discharge. ?Neck: Negative for carotid bruits. JVP not elevated. ?Lungs: Clear bilaterally to auscultation without wheezes, rales, or rhonchi. Breathing is unlabored. ?Heart: RRR S1 S2 without murmurs, rubs, or gallops.  ?Abdomen: Soft, non-tender, non-distended with normoactive bowel sounds. No rebound/guarding. ?Extremities: No clubbing or cyanosis. No edema. Distal pedal pulses are 2+ and equal bilaterally. ?Neuro: Alert and oriented X 3. Moves all extremities spontaneously. ?Psych:  Responds to questions appropriately with a normal affect. ? ?EKG:  The EKG was personally reviewed and  demonstrates:   ?3/17 - active order without corresponding tracing ?3/18 - atrial fib 115bpm, tall QRS complexes suggestive of LVH, borderline prolonged QTc 553ms, no acute STTW changes ?3/18 - atrial fib 94bpm, tal

## 2021-09-17 NOTE — TOC Transition Note (Addendum)
Transition of Care (TOC) - CM/SW Discharge Note ? ? ?Patient Details  ?Name: David Shields ?MRN: [id number] ?Date of Birth: 20-May-1991 ? ?Transition of Care (TOC) CM/SW Contact:  ?Kermit Balo, RN ?Phone Number: ?09/17/2021, 2:10 PM ? ? ?Clinical Narrative:    ?Patient discharging home with outpatient therapy through [facility]. Information faxed to patients PCP through the [place] for approval: [phone]. ?Patient has transport home.  ? ? ?Final next level of care: OP Rehab ?Barriers to Discharge: No Barriers Identified ? ? ?Patient Goals and CMS Choice ?  ?  ?Choice offered to / list presented to : Patient ? ?Discharge Placement ?  ?           ?  ?  ?  ?  ? ?Discharge Plan and Services ?  ?Discharge Planning Services: CM Consult ?           ?  ?  ?  ?  ?  ?  ?  ?  ?  ?  ? ?Social Determinants of Health (SDOH) Interventions ?  ? ? ?Readmission Risk Interventions ?No flowsheet data found. ? ? ? ? ?

## 2021-09-18 NOTE — TOC CAGE-AID Note (Signed)
Transition of Care (TOC) - CAGE-AID Screening ? ? ?Patient Details  ?Name: David Shields ?MRN: [id number] ?Date of Birth: May 15, 1991 ? ?Transition of Care (TOC) CM/SW Contact:    ?Carollyn Etcheverry C Tarpley-Carter, LCSWA ?Phone Number: ?09/18/2021, 1:04 PM ? ? ?Clinical Narrative: ?Pt was discharged before assessment could be done. ? ?Insurance underwriter, MSW, LCSW-A ?Pronouns:  She/Her/Hers ?[facility] HealthTransitions of Care ?Clinical Social Worker ?Direct Number:  [phone] ?[email or web address] ? ?CAGE-AID Screening: ?  ? ?  ?  ?  ?  ?  ? ?  ? ?  ? ? ? ? ? ? ?

## 2021-09-20 ENCOUNTER — Encounter (HOSPITAL_COMMUNITY): Payer: Self-pay

## 2021-09-26 ENCOUNTER — Telehealth: Payer: Self-pay | Admitting: Cardiovascular Disease

## 2021-09-26 NOTE — Telephone Encounter (Signed)
Attempted to contact pt.  No answer and VM was full.   ?

## 2021-09-26 NOTE — Telephone Encounter (Signed)
[employer] called to let Dr. Elease Hashimoto know that the patient has not applied his monitor yet. ?If the office needs to contact [facility] reference  Ticket # [phone] ? ? ? ?

## 2021-09-28 NOTE — Telephone Encounter (Signed)
Second attempt to contact patient.  No answer and VM was full. ?

## 2021-10-02 ENCOUNTER — Encounter (HOSPITAL_COMMUNITY): Payer: Self-pay

## 2021-10-02 ENCOUNTER — Ambulatory Visit (HOSPITAL_COMMUNITY): Admission: RE | Admit: 2021-10-02 | Payer: Self-pay | Source: Ambulatory Visit

## 2021-10-03 NOTE — Telephone Encounter (Signed)
Spoke with pt who states he will have his wife help him apply monitor tonight.  Pt advised to contact office if he needs further assistance with application of monitor.  Pt verbalizes understanding and thanked Charity fundraiser for the call. ?

## 2021-10-22 ENCOUNTER — Ambulatory Visit: Payer: Self-pay | Admitting: Physician Assistant

## 2021-10-23 ENCOUNTER — Encounter: Payer: Self-pay | Admitting: Physician Assistant

## 2021-10-23 NOTE — Progress Notes (Signed)
? ?Cardiology Office Note   ? ?Date:  10/25/2021  ? ?ID:  David Shields, DOB [birth year], MRN [id number] ? ?[facility] ?Cardiologist:  Mertie Moores, MD  ?Electrophysiologist:  None  ? ?Chief Complaint: f/u stroke ? ?History of Present Illness:  ? ?David Shields is a 31 y.o. 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 ? ?In 08/2021 he was admitted with various neurologic symptoms and found to have a stroke. Cerebral perfusion imaging showed possible right vertebral dissection. He ultimately underwent cerebral angiogram showing dissected RT vertebral artery and occluded right vertebrobasilar junction. He underwent endovascular coiling. He was also noted to be in atrial fibrillation on arrival (rates 90s-1teens). 2D echo 09/16/21 showed EF 50-55%, severe asymmetric LVH, indeterminate diastolic function, small aortic root aneurysm. He was started on ASA 81mg  and Plavix along with high dose statin. He converted to NSR spontaneously overnight in the hospital. Dr. Acie Fredrickson recommended to defer anticoagulation pending outpatient 2 week monitor. He also suggested obtaining cardiac MRI after monitor, given degree of LVH. The patient also planned to f/u at the [facility] for sleep study. ? ?He returns for follow-up today. He reports he had his sleep study done and is awaiting the results. He still has occasional palpitations at night when trying to sleep. He has not yet started the monitor due to being busy with other things but plans to start wearing it. He does confirm it was successfully mailed to his home and he has it there. He denies any CP or SOB. He continues to have issues with residual right sided numbness and headaches since the stroke. He has f/u with neurology scheduled in June and f/u at the [place] in May. ? ? ?Labwork independently reviewed: ?08/2021 Mg 2.2, K 3.8  (intermittently low but last check normal), Cr 0.95, albumin 3.3, AST/ALT Ok, Tbili 1.3, Hgb/plt wnl, LDL 130, TSH and A1c wnl ? ?Cardiology Studies:  ? ?Studies reviewed are outlined and summarized above. Reports included below if pertinent.  ? ?2d echo 08/2021 ? ? ? ? 1. No LVOT obstruction. No SAM. Left ventricular ejection fraction, by  ?estimation, is 50 to 55%. The left ventricle has low normal function. The  ?left ventricle has no regional wall motion abnormalities. There is severe  ?asymmetric left ventricular  ?hypertrophy. Left ventricular diastolic parameters are indeterminate.  ? 2. Right ventricular systolic function is normal. The right ventricular  ?size is normal.  ? 3. The mitral valve is grossly normal. No evidence of mitral valve  ?regurgitation.  ? 4. The aortic valve is normal in structure. Aortic valve regurgitation is  ?not visualized.  ? 5. Aortic small root aneurysm.  ? 6. The inferior vena cava is normal in size with greater than 50%  ?respiratory variability, suggesting right atrial pressure of 3 mmHg.  ? ?Comparison(s): No prior Echocardiogram.  ? ?Conclusion(s)/Recommendation(s): With degree of LV thickness, recommend  ?Cardiac MRI.   ? ? ?Past Medical History:  ?Diagnosis Date  ? Anxiety   ? PAF (paroxysmal atrial fibrillation) ([place])   ? Severe left ventricular hypertrophy   ? Stroke (cerebrum) ([place])   ? Vertebral artery dissection ([facility])   ? ? ?Past Surgical History:  ?Procedure Laterality Date  ? ADENOIDECTOMY    ? IR ANGIO INTRA EXTRACRAN SEL COM CAROTID INNOMINATE BILAT MOD SED  09/15/2021  ? IR ANGIO VERTEBRAL SEL VERTEBRAL UNI  L MOD SED  09/15/2021  ? IR ANGIOGRAM FOLLOW UP STUDY  09/15/2021  ? IR ANGIOGRAM FOLLOW UP STUDY  09/15/2021  ? IR ANGIOGRAM FOLLOW UP STUDY  09/15/2021  ? IR ANGIOGRAM FOLLOW UP STUDY  09/15/2021  ? IR ANGIOGRAM FOLLOW UP STUDY  09/15/2021  ? IR ANGIOGRAM FOLLOW UP STUDY  09/15/2021  ? IR ANGIOGRAM FOLLOW UP STUDY  09/15/2021  ? IR ANGIOGRAM FOLLOW UP STUDY   09/15/2021  ? IR ANGIOGRAM FOLLOW UP STUDY  09/15/2021  ? IR ANGIOGRAM FOLLOW UP STUDY  09/15/2021  ? IR ANGIOGRAM FOLLOW UP STUDY  09/15/2021  ? IR ANGIOGRAM FOLLOW UP STUDY  09/15/2021  ? IR CT HEAD LTD  09/15/2021  ? IR PERCUTANEOUS ART THROMBECTOMY/INFUSION INTRACRANIAL INC DIAG ANGIO  09/15/2021  ? IR TRANSCATH/EMBOLIZ  09/15/2021  ? RADIOLOGY WITH ANESTHESIA N/A 09/15/2021  ? Procedure: IR WITH ANESTHESIA;  Surgeon: Luanne Bras, MD;  Location: [facility];  Service: Radiology;  Laterality: N/A;  ? ? ?Current Medications: ?Current Meds  ?Medication Sig  ? aspirin 81 MG chewable tablet Chew 1 tablet (81 mg total) by mouth daily.  ? atorvastatin (LIPITOR) 80 MG tablet Take 1 tablet (80 mg total) by mouth daily.  ? clopidogrel (PLAVIX) 75 MG tablet Take 1 tablet (75 mg total) by mouth daily.  ? metoprolol tartrate (LOPRESSOR) 25 MG tablet Take 1 tablet (25 mg total) by mouth 2 (two) times daily.  ? ?  ? ?Allergies:   Banana  ? ?Social History  ? ?Socioeconomic History  ? Marital status: Single  ?  Spouse name: Not on file  ? Number of children: Not on file  ? Years of education: Not on file  ? Highest education level: Not on file  ?Occupational History  ? Not on file  ?Tobacco Use  ? Smoking status: Some Days  ?  Packs/day: 0.50  ?  Types: Cigarettes, Cigars  ? Smokeless tobacco: Never  ?Vaping Use  ? Vaping Use: Never used  ?Substance and Sexual Activity  ? Alcohol use: Yes  ?  Comment: occasionally  ? Drug use: No  ? Sexual activity: Not on file  ?Other Topics Concern  ? Not on file  ?Social History Narrative  ? Not on file  ? ?Social Determinants of Health  ? ?Financial Resource Strain: Not on file  ?Food Insecurity: Not on file  ?Transportation Needs: Not on file  ?Physical Activity: Not on file  ?Stress: Not on file  ?Social Connections: Not on file  ?  ? ?Family History:  ?The patient's family history includes Cancer in his maternal grandmother and paternal uncle; Depression in his mother; Hypertension in his  maternal grandmother. ? ?ROS:   ?Please see the history of present illness.  ?All other systems are reviewed and otherwise negative.  ? ? ?EKG(s)/Additional Labs  ? ?EKG:  EKG is ordered today, personally reviewed, demonstrating NSR with sinus arrhythmia 63bpm, QTc 429, no acute STT changes. ? ?Recent Labs: ?09/15/2021: TSH 0.990 ?09/16/2021: ALT 39; Hemoglobin 14.2; Platelets 190 ?09/17/2021: BUN 6; Creatinine, Ser 0.95; Magnesium 2.2; Potassium 3.8; Sodium 137  ?Recent Lipid Panel ?   ?Component Value Date/Time  ? CHOL 192 09/15/2021 0605  ? TRIG 86 09/15/2021 0605  ? HDL 45 09/15/2021 0605  ? CHOLHDL 4.3 09/15/2021 0605  ? VLDL 17 09/15/2021 0605  ? [place] 130 (H) 09/15/2021 [id number]  ? ? ?PHYSICAL EXAM:   ? ?VS:  BP 122/76   Pulse 87   Ht 6\' 1"  (  1.854 m)   Wt 211 lb 6.4 oz (95.9 kg)   SpO2 99%   BMI 27.89 kg/m?   BMI: Body mass index is 27.89 kg/m?. ? ?GEN: Well nourished, well developed male in no acute distress ?HEENT: normocephalic, atraumatic ?Neck: no JVD, carotid bruits, or masses ?Cardiac: RRR; no murmurs, rubs, or gallops, no edema  ?Respiratory:  clear to auscultation bilaterally, normal work of breathing ?GI: soft, nontender, nondistended, + BS ?MS: no deformity or atrophy ?Skin: warm and dry, no rash ?Neuro:  Alert and Oriented x 3, Strength and sensation are intact, follows commands ?Psych: euthymic mood, full affect ? ?Wt Readings from Last 3 Encounters:  ?10/25/21 211 lb 6.4 oz (95.9 kg)  ?09/14/21 205 lb (93 kg)  ?12/20/16 185 lb (83.9 kg)  ?  ? ?ASSESSMENT & PLAN:  ? ?1. Paroxysmal atrial fibrillation - noted transiently in the hospital during recent admission. Unclear if this was physiologically mediated by stress of stroke or perhaps paroxysmal in nature, occurring outside of stroke. Dr. Acie Fredrickson recommended to hold off oral anticoagulation and pursue 14 day event monitor. He confirms he received this but has not yet applie it. I have encouraged him to do so. During recent admission we did discuss  with Dr. Erlinda Hong what we would need to do if atrial fib was seen on monitor. Per his reply, "I talked with Dr. Estanislado Pandy, we will treat him with DAPT/ASA for 2-3 months and then ASA alone. but if AC needed for afib, we can sto

## 2021-10-25 ENCOUNTER — Ambulatory Visit (INDEPENDENT_AMBULATORY_CARE_PROVIDER_SITE_OTHER): Payer: Self-pay | Admitting: Physician Assistant

## 2021-10-25 ENCOUNTER — Encounter: Payer: Self-pay | Admitting: Physician Assistant

## 2021-10-25 VITALS — BP 122/76 | HR 87 | Ht 73.0 in | Wt 211.4 lb

## 2021-10-25 DIAGNOSIS — R9431 Abnormal electrocardiogram [ECG] [EKG]: Secondary | ICD-10-CM

## 2021-10-25 DIAGNOSIS — Z8673 Personal history of transient ischemic attack (TIA), and cerebral infarction without residual deficits: Secondary | ICD-10-CM

## 2021-10-25 DIAGNOSIS — I517 Cardiomegaly: Secondary | ICD-10-CM

## 2021-10-25 DIAGNOSIS — I7121 Aneurysm of the ascending aorta, without rupture: Secondary | ICD-10-CM

## 2021-10-25 DIAGNOSIS — I480 Paroxysmal atrial fibrillation: Secondary | ICD-10-CM

## 2021-10-25 LAB — BASIC METABOLIC PANEL
BUN/Creatinine Ratio: 9 (ref 9–20)
BUN: 8 mg/dL (ref 6–20)
CO2: 31 mmol/L — ABNORMAL HIGH (ref 20–29)
Calcium: 9.8 mg/dL (ref 8.7–10.2)
Chloride: 106 mmol/L (ref 96–106)
Creatinine, Ser: 0.91 mg/dL (ref 0.76–1.27)
Glucose: 93 mg/dL (ref 70–99)
Potassium: 4.3 mmol/L (ref 3.5–5.2)
Sodium: 142 mmol/L (ref 134–144)
eGFR: 117 mL/min/{1.73_m2} (ref >59)

## 2021-10-25 LAB — HEMOGLOBIN AND HEMATOCRIT, BLOOD
Hematocrit: 42.9 % (ref 37.5–51.0)
Hemoglobin: 14.2 g/dL (ref 13.0–17.7)

## 2021-10-25 NOTE — Patient Instructions (Addendum)
Medication Instructions:  ?Your physician recommends that you continue on your current medications as directed. Please refer to the Current Medication list given to you today. ?*If you need a refill on your cardiac medications before your next appointment, please call your pharmacy* ? ? ?Lab Work: ?TODAY-BMET, Hemoglobin and Hematocrit ?1 MONTH-FASTING-LIPDS, LFT ?If you have labs (blood work) drawn today and your tests are completely normal, you will receive your results only by: ?MyChart Message (if you have MyChart) OR ?A paper copy in the mail ?If you have any lab test that is abnormal or we need to change your treatment, we will call you to review the results. ? ? ?Testing/Procedures: ? ? ? ?You are scheduled for Cardiac MRI on ______________. Please arrive at the [facility] main entrance of [facility] at ________________ (30-45 minutes prior to test start time). ? ? ?[facility] ?[address] ?[place],  [address] ?[phone] ? ?Please take advantage of the free valet parking available at the MAIN entrance (A entrance). Proceed to the [facility] Radiology Department (First Floor). ?? ?Magnetic resonance imaging (MRI) is a painless test that produces images of the inside of the body without using Xrays. ? ?During an MRI, strong magnets and radio waves work together in a Research officer, political party to form detailed images.  ? ?MRI images may provide more details about a medical condition than X-rays, CT scans, and ultrasounds can provide. ? ?You may be given earphones to listen for instructions. ? ?You may eat a light breakfast and take medications as ordered with the exception of HCTZ (fluid pill, other). Please avoid stimulants for 12 hr prior to test. (Ie. Caffeine, nicotine, chocolate, or antihistamine medications) ? ?If a contrast material will be used, an IV will be inserted into one of your veins. Contrast material will be injected into your IV. It will leave your body through your  urine within a day. You may be told to drink plenty of fluids to help flush the contrast material out of your system. ? ?You will be asked to remove all metal, including: Watch, jewelry, and other metal objects including hearing aids, hair pieces and dentures. Also wearable glucose monitoring systems (ie. Freestyle Libre and Omnipods) (Braces and fillings normally are not a problem.) ? ? ?TEST WILL TAKE APPROXIMATELY 1 HOUR ? ?PLEASE NOTIFY SCHEDULING AT LEAST 24 HOURS IN ADVANCE IF YOU ARE UNABLE TO KEEP YOUR APPOINTMENT. ?[phone] ? ?Please call Marchia Bond, cardiac imaging nurse navigator with any questions/concerns. ?Marchia Bond RN Navigator Cardiac Imaging ?Gordy Clement RN Navigator Cardiac Imaging ?[facility] ?[phone] Office  ? ? ? ? ?Follow-Up: ?At [facility], you and your health needs are our priority.  As part of our continuing mission to provide you with exceptional heart care, we have created designated Provider Care Teams.  These Care Teams include your primary Cardiologist (physician) and Advanced Practice Providers (APPs -  Physician Assistants and Nurse Practitioners) who all work together to provide you with the care you need, when you need it. ? ?We recommend signing up for the patient portal called "MyChart".  Sign up information is provided on this After Visit Summary.  MyChart is used to connect with patients for Virtual Visits (Telemedicine).  Patients are able to view lab/test results, encounter notes, upcoming appointments, etc.  Non-urgent messages can be sent to your provider as well.   ?To learn more about what you can do with MyChart, go to [email or web address].   ? ?  Your next appointment:   ?3 month(s) ? ?The format for your next appointment:   ?In Person ? ?Provider:   ?Mertie Moores, MD or Melina Copa, PA  ? ? ?Other Instructions ? ? ?Important Information About Sugar ? ? ? ? ?  ?

## 2021-11-22 ENCOUNTER — Other Ambulatory Visit: Payer: Self-pay

## 2021-11-22 ENCOUNTER — Telehealth (HOSPITAL_COMMUNITY): Payer: Self-pay | Admitting: *Deleted

## 2021-11-22 NOTE — Telephone Encounter (Signed)
Attempted to call patient regarding upcoming cardiac MRI appointment. Unable to leave a voicemail but was able to leave an SMS notification.  Larey Brick RN Navigator Cardiac Imaging [facility] [phone] Office [phone] Cell

## 2021-11-23 ENCOUNTER — Ambulatory Visit (HOSPITAL_COMMUNITY): Admission: RE | Admit: 2021-11-23 | Payer: Self-pay | Source: Ambulatory Visit

## 2021-12-12 ENCOUNTER — Encounter: Payer: Self-pay | Admitting: Neurology

## 2021-12-12 ENCOUNTER — Inpatient Hospital Stay: Payer: Self-pay | Admitting: Neurology

## 2021-12-21 ENCOUNTER — Other Ambulatory Visit: Payer: Self-pay

## 2022-01-01 ENCOUNTER — Other Ambulatory Visit: Payer: Self-pay

## 2022-01-01 ENCOUNTER — Emergency Department (HOSPITAL_BASED_OUTPATIENT_CLINIC_OR_DEPARTMENT_OTHER)
Admission: EM | Admit: 2022-01-01 | Discharge: 2022-01-01 | Discharge status: Against Medical Advice | Payer: No Typology Code available for payment source | Attending: Emergency Medicine | Admitting: Emergency Medicine

## 2022-01-01 DIAGNOSIS — Z8673 Personal history of transient ischemic attack (TIA), and cerebral infarction without residual deficits: Secondary | ICD-10-CM | POA: Diagnosis not present

## 2022-01-01 DIAGNOSIS — R42 Dizziness and giddiness: Secondary | ICD-10-CM | POA: Diagnosis present

## 2022-01-01 DIAGNOSIS — Z5321 Procedure and treatment not carried out due to patient leaving prior to being seen by health care provider: Secondary | ICD-10-CM | POA: Insufficient documentation

## 2022-01-01 DIAGNOSIS — R202 Paresthesia of skin: Secondary | ICD-10-CM | POA: Diagnosis not present

## 2022-01-01 LAB — BASIC METABOLIC PANEL
Anion gap: 9 (ref 5–15)
BUN: 13 mg/dL (ref 6–20)
CO2: 26 mmol/L (ref 22–32)
Calcium: 9.9 mg/dL (ref 8.9–10.3)
Chloride: 105 mmol/L (ref 98–111)
Creatinine, Ser: 1.03 mg/dL (ref 0.61–1.24)
GFR, Estimated: >60 mL/min (ref >60)
Glucose, Bld: 87 mg/dL (ref 70–99)
Potassium: 3.7 mmol/L (ref 3.5–5.1)
Sodium: 140 mmol/L (ref 135–145)

## 2022-01-01 LAB — CBC
HCT: 43.9 % (ref 39.0–52.0)
Hemoglobin: 14 g/dL (ref 13.0–17.0)
MCH: 27.1 pg (ref 26.0–34.0)
MCHC: 31.9 g/dL (ref 30.0–36.0)
MCV: 85.1 fL (ref 80.0–100.0)
Platelets: 189 10*3/uL (ref 150–400)
RBC: 5.16 MIL/uL (ref 4.22–5.81)
RDW: 13.2 % (ref 11.5–15.5)
WBC: 4.4 10*3/uL (ref 4.0–10.5)
nRBC: 0 % (ref 0.0–0.2)

## 2022-01-01 LAB — URINALYSIS, ROUTINE W REFLEX MICROSCOPIC
Bilirubin Urine: NEGATIVE
Glucose, UA: NEGATIVE mg/dL
Hgb urine dipstick: NEGATIVE
Ketones, ur: NEGATIVE mg/dL
Leukocytes,Ua: NEGATIVE
Nitrite: NEGATIVE
Protein, ur: NEGATIVE mg/dL
Specific Gravity, Urine: <1.005 — ABNORMAL LOW (ref 1.005–1.030)
pH: 7 (ref 5.0–8.0)

## 2022-01-01 LAB — CBG MONITORING, ED: Glucose-Capillary: 108 mg/dL — ABNORMAL HIGH (ref 70–99)

## 2022-01-01 NOTE — ED Notes (Signed)
Called for patient x2 in lobby. No answer

## 2022-01-01 NOTE — ED Notes (Signed)
RN called patient and emergency contact. Patient's wife answered and said she would talk to patient about coming back inside. RN attempted to re-call on cell phone as patient was not visualized in lobby.

## 2022-01-01 NOTE — ED Triage Notes (Addendum)
Patient arrives with complaints of facial tingling and dizziness x2 hours. Patient states that he had a stroke 2 months ago that needed an IR procedure and wanted to be safe.  He reports that he is compliant with his medications.  Ambulatory to triage with wife. No slurred speech, weak extremities, or facial droop.

## 2022-01-21 ENCOUNTER — Encounter: Payer: Self-pay | Admitting: Cardiovascular Disease

## 2022-01-21 NOTE — Progress Notes (Unsigned)
  This encounter was created in error - please disregard.  No show

## 2022-01-22 ENCOUNTER — Encounter: Payer: Self-pay | Admitting: Cardiovascular Disease

## 2022-02-23 ENCOUNTER — Emergency Department (HOSPITAL_BASED_OUTPATIENT_CLINIC_OR_DEPARTMENT_OTHER): Payer: No Typology Code available for payment source

## 2022-02-23 ENCOUNTER — Other Ambulatory Visit: Payer: Self-pay

## 2022-02-23 ENCOUNTER — Emergency Department (HOSPITAL_BASED_OUTPATIENT_CLINIC_OR_DEPARTMENT_OTHER)
Admission: EM | Admit: 2022-02-23 | Discharge: 2022-02-23 | Disposition: A | Discharge status: Home / Self Care | Payer: No Typology Code available for payment source | Attending: Emergency Medicine | Admitting: Emergency Medicine

## 2022-02-23 ENCOUNTER — Encounter (HOSPITAL_BASED_OUTPATIENT_CLINIC_OR_DEPARTMENT_OTHER): Payer: Self-pay

## 2022-02-23 DIAGNOSIS — Z7902 Long term (current) use of antithrombotics/antiplatelets: Secondary | ICD-10-CM | POA: Insufficient documentation

## 2022-02-23 DIAGNOSIS — R112 Nausea with vomiting, unspecified: Secondary | ICD-10-CM | POA: Diagnosis not present

## 2022-02-23 DIAGNOSIS — R0789 Other chest pain: Secondary | ICD-10-CM | POA: Diagnosis not present

## 2022-02-23 DIAGNOSIS — Z7982 Long term (current) use of aspirin: Secondary | ICD-10-CM | POA: Insufficient documentation

## 2022-02-23 DIAGNOSIS — R079 Chest pain, unspecified: Secondary | ICD-10-CM

## 2022-02-23 LAB — BASIC METABOLIC PANEL
Anion gap: 10 (ref 5–15)
BUN: 13 mg/dL (ref 6–20)
CO2: 26 mmol/L (ref 22–32)
Calcium: 9.2 mg/dL (ref 8.9–10.3)
Chloride: 104 mmol/L (ref 98–111)
Creatinine, Ser: 1.02 mg/dL (ref 0.61–1.24)
GFR, Estimated: >60 mL/min (ref >60)
Glucose, Bld: 95 mg/dL (ref 70–99)
Potassium: 3.9 mmol/L (ref 3.5–5.1)
Sodium: 140 mmol/L (ref 135–145)

## 2022-02-23 LAB — CBC
HCT: 45.6 % (ref 39.0–52.0)
Hemoglobin: 15.2 g/dL (ref 13.0–17.0)
MCH: 27.9 pg (ref 26.0–34.0)
MCHC: 33.3 g/dL (ref 30.0–36.0)
MCV: 83.7 fL (ref 80.0–100.0)
Platelets: 193 10*3/uL (ref 150–400)
RBC: 5.45 MIL/uL (ref 4.22–5.81)
RDW: 13.3 % (ref 11.5–15.5)
WBC: 5.4 10*3/uL (ref 4.0–10.5)
nRBC: 0 % (ref 0.0–0.2)

## 2022-02-23 LAB — TROPONIN I (HIGH SENSITIVITY)
Troponin I (High Sensitivity): 13 ng/L (ref <18)
Troponin I (High Sensitivity): 14 ng/L (ref <18)

## 2022-02-23 LAB — CBG MONITORING, ED: Glucose-Capillary: 100 mg/dL — ABNORMAL HIGH (ref 70–99)

## 2022-02-23 MED ORDER — LIDOCAINE VISCOUS HCL 2 % MT SOLN
15.0000 mL | Freq: Once | OROMUCOSAL | Status: DC
  Filled 2022-02-23: qty 15

## 2022-02-23 MED ORDER — ONDANSETRON HCL 4 MG PO TABS: 4.0000 mg | ORAL_TABLET | Freq: Four times a day (QID) | ORAL | 0 refills | Status: DC

## 2022-02-23 MED ORDER — ALUM & MAG HYDROXIDE-SIMETH 200-200-20 MG/5ML PO SUSP
30.0000 mL | Freq: Once | ORAL | Status: DC
  Filled 2022-02-23: qty 30

## 2022-02-23 MED ORDER — FAMOTIDINE IN NACL 20-0.9 MG/50ML-% IV SOLN
20.0000 mg | Freq: Once | INTRAVENOUS | Status: DC
  Filled 2022-02-23: qty 50

## 2022-02-23 NOTE — ED Notes (Signed)
Dc instructions reviewed with patient. Patient voiced understanding. Dc with belongings.  °

## 2022-02-23 NOTE — ED Triage Notes (Signed)
He reports feeling left ant. Chest "pressure" this morning 6/10. His skin is normal, warm and dry and he is breathing normally.

## 2022-02-23 NOTE — ED Provider Notes (Signed)
[facility] EMERGENCY DEPT Provider Note   CSN: [id number] Arrival date & time: 02/23/22  0755     History  Chief Complaint  Patient presents with   Chest Pain    David Shields is a 31 y.o. male.  31 year old male with a history of vertebral artery dissection and resulting stroke on Plavix status post stenting who presents to the emergency department with chest pressure.  Patient states that for the past month he has had nausea and vomiting in the mornings.  Denies any headache during that time or new neurologic symptoms.  Says that he did vomit today that was nonbloody nonbilious and started experiencing chest pressure afterwards.  Says it is nonpleuritic, nonexertional, not reproducible.  Denies any injuries to the area.  Denies any diaphoresis associated with it.  Says it is not sharp and that it does not radiate to the back and denies any numbness or weakness of his arms or legs.  Said that with his history of stroke he was concerned so he decided to come into the emergency department for evaluation.  Does not smoke, drink, or do any recreational drugs.  No immediate family members with a history of MI.  No personal history of MI.   Chest Pain      Home Medications Prior to Admission medications   Medication Sig Start Date End Date Taking? Authorizing Provider  ondansetron (ZOFRAN) 4 MG tablet Take 1 tablet (4 mg total) by mouth every 6 (six) hours. 02/23/22  Yes Rondel Baton, MD  aspirin 81 MG chewable tablet Chew 1 tablet (81 mg total) by mouth daily. 09/18/21   Rinehuls, Kinnie Scales, PA-C  atorvastatin (LIPITOR) 80 MG tablet Take 1 tablet (80 mg total) by mouth daily. 09/18/21   Rinehuls, Kinnie Scales, PA-C  clopidogrel (PLAVIX) 75 MG tablet Take 1 tablet (75 mg total) by mouth daily. 09/18/21   Rinehuls, Kinnie Scales, PA-C  metoprolol tartrate (LOPRESSOR) 25 MG tablet Take 1 tablet (25 mg total) by mouth 2 (two) times daily. 09/17/21   Rinehuls, Kinnie Scales, PA-C       Allergies    Banana    Review of Systems   Review of Systems  Cardiovascular:  Positive for chest pain.    Physical Exam Updated Vital Signs BP (!) 117/91   Pulse 82   Temp 98 F (36.7 C) (Oral)   Resp (!) 25   SpO2 99%  Physical Exam Vitals and nursing note reviewed.  Constitutional:      General: He is not in acute distress.    Appearance: He is well-developed.  HENT:     Head: Normocephalic and atraumatic.     Right Ear: External ear normal.     Left Ear: External ear normal.     Nose: Nose normal.  Eyes:     Extraocular Movements: Extraocular movements intact.     Conjunctiva/sclera: Conjunctivae normal.     Pupils: Pupils are equal, round, and reactive to light.  Cardiovascular:     Rate and Rhythm: Normal rate and regular rhythm.     Pulses:          Radial pulses are 2+ on the right side and 2+ on the left side.       Dorsalis pedis pulses are 2+ on the right side and 2+ on the left side.     Heart sounds: Normal heart sounds.  Pulmonary:     Effort: Pulmonary effort is normal. No respiratory distress.  Breath sounds: Normal breath sounds.  Abdominal:     General: There is no distension.     Palpations: Abdomen is soft. There is no mass.     Tenderness: There is no abdominal tenderness. There is no guarding.  Musculoskeletal:        General: No swelling.     Cervical back: Normal range of motion and neck supple.     Right lower leg: No edema.     Left lower leg: No edema.  Skin:    General: Skin is warm and dry.     Capillary Refill: Capillary refill takes less than 2 seconds.  Neurological:     Mental Status: He is alert. Mental status is at baseline.     Comments: MENTAL STATUS: AAOx3 CRANIAL NERVES: II: Pupils equal and reactive 4 mm BL, no RAPD, no VF deficits III, IV, VI: EOM intact, no gaze preference or deviation, no nystagmus. V: normal sensation to light touch in V1, V2, and V3 segments bilaterally VII: no facial weakness or asymmetry,  no nasolabial fold flattening VIII: normal hearing to speech and finger friction IX, X: normal palatal elevation, no uvular deviation XI: 5/5 head turn and 5/5 shoulder shrug bilaterally XII: midline tongue protrusion MOTOR: 5/5 strength in R shoulder flexion, elbow flexion and extension, and grip strength. 5/5 strength in L shoulder flexion, elbow flexion and extension, and grip strength.  5/5 strength in R hip and knee flexion, knee extension, ankle plantar and dorsiflexion. 5/5 strength in L hip and knee flexion, knee extension, ankle plantar and dorsiflexion. SENSORY: Normal sensation to light touch in all extremities COORD: Normal finger to nose and heel to shin, no tremor, no dysmetria STATION: normal stance, no truncal ataxia GAIT: Normal   Psychiatric:        Mood and Affect: Mood normal.        Behavior: Behavior normal.     ED Results / Procedures / Treatments   Labs (all labs ordered are listed, but only abnormal results are displayed) Labs Reviewed  CBG MONITORING, ED - Abnormal; Notable for the following components:      Result Value   Glucose-Capillary 100 (*)    All other components within normal limits  BASIC METABOLIC PANEL  CBC  TROPONIN I (HIGH SENSITIVITY)  TROPONIN I (HIGH SENSITIVITY)    EKG EKG Interpretation  Date/Time:  Saturday February 23 2022 08:02:30 EDT Ventricular Rate:  84 PR Interval:  130 QRS Duration: 97 QT Interval:  391 QTC Calculation: 463 R Axis:   54 Text Interpretation: Sinus rhythm Confirmed by Vonita Moss [phone]) on 02/23/2022 8:10:21 AM  Radiology DG Chest Portable 1 View  Result Date: 02/23/2022 CLINICAL DATA:  32 year old male history of left-sided chest pain and dizziness. EXAM: PORTABLE CHEST 1 VIEW COMPARISON:  Chest x-ray 09/15/2021. FINDINGS: Lung volumes are normal. No consolidative airspace disease. No pleural effusions. No pneumothorax. No pulmonary nodule or mass noted. Pulmonary vasculature and the  cardiomediastinal silhouette are within normal limits. IMPRESSION: No radiographic evidence of acute cardiopulmonary disease. Electronically Signed   By: Trudie Reed M.D.   On: 02/23/2022 08:57    Procedures Procedures    Medications Ordered in ED Medications - No data to display   ED Course/ Medical Decision Making/ A&P Clinical Course as of 02/23/22 1722  Sat Feb 23, 2022  0907 Chest x-ray reviewed and interpreted by me as showing no acute abnormality. [RP]  1050 Heart score of 1 for risk factors.  [RP]  Clinical Course User Index [RP] Fransico Meadow, MD                           Medical Decision Making Amount and/or Complexity of Data Reviewed Labs: ordered. Radiology: ordered.  Risk OTC drugs. Prescription drug management.   31 year old male with a history of vertebral artery dissection and resulting stroke on Plavix status post stenting who presents to the emergency department with chest pressure.  Initial Ddx:  Esophagitis, MI, dissection, brain mass or elevated ICP, nausea and vomiting  MDM:  Feel the patient's symptoms are likely due to esophagitis from his persistent nausea and vomiting.  Unclear why he is having this nausea and vomiting in the morning but has not had any headache.  Today he has a nonfocal neurologic exam so do not feel that he requires emergent head imaging at this time.  Considered MI but patient's story is atypical but will evaluate for this with his history of stroke.  Also consider dissection but the patient has symmetric pulses today without any neurologic deficits and his description of the pain is not typical for her dissection either.  Plan:  Labs Troponins Chest x-ray EKG GI cocktail  ED Summary:  Patient underwent the above work-up that was reassuring.  Chest x-ray did not show acute abnormality or widened mediastinum.  Patient's serial troponins were negative.  He had a heart score of 1 due to his risk factors of prior  stroke.  Patient refused GI cocktail but reported that his pain had resolved prior to discharge.  Return precautions were discussed with the patient prior to discharge and I also informed him that he should follow-up with his primary doctor in several days regarding his symptoms.  Informed him that he should also follow-up with his primary doctor about his nausea and vomiting to see if he does require head imaging.  Dispo: DC Home  Records reviewed: Outpatient notes, discharge summary, admission records The following labs were independently interpreted: Chemistry and Serial Troponins I independently visualized the following imaging with scope of interpretation limited to determining acute life threatening conditions related to emergency care: Chest x-ray, which revealed no acute abnormality  Final Clinical Impression(s) / ED Diagnoses Final diagnoses:  Chest pain, unspecified type  Nausea and vomiting, unspecified vomiting type    Rx / DC Orders ED Discharge Orders          Ordered    ondansetron (ZOFRAN) 4 MG tablet  Every 6 hours        02/23/22 1051              Fransico Meadow, MD 02/23/22 1722

## 2022-02-23 NOTE — Discharge Instructions (Signed)
Today you were seen in the emergency department for your chest pain.    In the emergency department you had an EKG, troponin, and chest x-ray that were reassuring.    At home, please take the Zofran we have prescribed you as needed for any nausea or vomiting that you may have.    Follow-up with your primary doctor in 2-3 days regarding your visit and talk to them about your nausea and vomiting to see if you need a brain MRI.  Return immediately to the emergency department if you experience any of the following: Worsening vomiting, chest pain, numbness or weakness of your arms or legs, or any other concerning symptoms.    Thank you for visiting our Emergency Department. It was a pleasure taking care of you today.

## 2022-04-29 ENCOUNTER — Ambulatory Visit (INDEPENDENT_AMBULATORY_CARE_PROVIDER_SITE_OTHER): Payer: No Typology Code available for payment source | Admitting: Neurology

## 2022-04-29 ENCOUNTER — Encounter: Payer: Self-pay | Admitting: Neurology

## 2022-04-29 VITALS — BP 139/89 | HR 92 | Ht 73.0 in | Wt 214.4 lb

## 2022-04-29 DIAGNOSIS — R200 Anesthesia of skin: Secondary | ICD-10-CM

## 2022-04-29 DIAGNOSIS — R202 Paresthesia of skin: Secondary | ICD-10-CM

## 2022-04-29 DIAGNOSIS — I4891 Unspecified atrial fibrillation: Secondary | ICD-10-CM

## 2022-04-29 DIAGNOSIS — I7774 Dissection of vertebral artery: Secondary | ICD-10-CM | POA: Diagnosis not present

## 2022-04-29 DIAGNOSIS — I639 Cerebral infarction, unspecified: Secondary | ICD-10-CM

## 2022-04-29 NOTE — Progress Notes (Signed)
[facility] [address] [place]. [place] [address] [phone]       OFFICE CONSULT NOTE  Mr. LYRICK LAGRAND Date of Birth:  1990-10-29 Medical Record Number:  [id number]   Referring MD:  Marvel Plan  Reason for Referral:  Cerebellar stroke  HPI: Mr. Folts is a 31 year old African-American male seen today for initial office consultation visit for stroke.  History is obtained from the patient and review of electronic medical records and I personally reviewed pertinent available imaging films in PACS.KEIDAN AUMILLER is a 31 y.o. male with PMH significant for anxiety who went to bed at 2100 on 09/15/2021 and woke up an hour later, attempted to get out of bed and fell immediately. He seemed off so family called 911. He was noted to have a L gaze preference, R sided ataxia, dysconjugate gaze, headache and vomited. He was brought in to the ED as code stroke. On arrival, symptoms were persistent. CTH w/o contrast negative for a large hypodensity concerning for a large territory infarct or hyperdensity concerning for an ICH. He was offered tNKASE but initially hesitant and not comfortable with the associated risk of ICH but wanted to wait to discuss with his wife. He and wife eventually opted for tNKASE. CT angio head and neck with concern for possible R vert dissection at V2/3 with patent V4 and no basilar occlusion, bilateral PCA patent, R and L PICA appear patent. CT Perfusion inconclusive due to motion.STAT MRI Brain was obtained to clarify if the noted dissection resulted in any significant strokes. MRI Brain was notable for punctate stroke in the medulla and a punctate stroke in the R cerbellum. He had worsening of his deficit and developed somnolence, R facial droop, worsening ataxia and reported new numbness. A STAT CTH was negative for an ICH. Code IR was activated at this time to get emergent diagnostic angiogram and for potential intervention if needed. Dr. Corliss Skains  discussed details, risks and benefits of intervention with patient and his wife inclding 10% risk of ICH and they consented to intervention and possible stenting.  LKW: 2100 on 09/15/21 mRS: 0 tNKASE: yes offered. Discussed risks and benefits with patient and he was hesitant to make a decision without speaking with his wife. I spoke to patient's wife and they were hesitant but eventually consented to tNKASE. Thrombectomy: emergent diagnostic angiogram and possible intervention was offered when his exam worsened and exam out of proportion to the MRI findings. NIHSS - 6 Dr. Corliss Skains took the patient for intervention but was unable to successfully deploy a stent and had to do therapeutic occlusion of the right V3, V4 segment of the right vertebral artery.  Procedure was uneventful.  Was kept in the ICU close neurological monitoring blood pressures currently controlled.  He was started on dual antiplatelet therapy aspirin and Plavix.  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  Ejection fraction 50 to 55%.  LDL cholesterol was 130 mg percent.  Globin A1c was 5.4.  Patient was discharged home with outpatient speech therapy as well.  He states his balance and recovered completely back to normal.  He has stopped aspirin currently on Plavix alone.  States his blood pressures under good control today it is 130/89.  He is tolerating Plavix without bruising or bleeding.  Remains on Lipitor tolerating well without the right side of his face months or so..  There are no  clear triggers for this. He was seen in the cardiology clinic in April 2023 and plan was to undergo cardiac MRI 30-day heart monitor for A-fib for unclear reasons without    ROS:   14 system review of systems is positive for headache vomiting, incoordination,facial numbness, gait imbalance and all other systems negative  PMH:   Past Medical History:  Diagnosis Date   Anxiety    PAF (paroxysmal atrial fibrillation) (HCC)    Severe left ventricular hypertrophy    Stroke (cerebrum) (HCC)    Vertebral artery dissection (HCC)     Social History:  Social History   Socioeconomic History   Marital status: Single    Spouse name: Not on file   Number of children: Not on file   Years of education: Not on file   Highest education level: Not on file  Occupational History   Not on file  Tobacco Use   Smoking status: Some Days    Packs/day: 0.50    Types: Cigarettes, Cigars   Smokeless tobacco: Never  Vaping Use   Vaping Use: Never used  Substance and Sexual Activity   Alcohol use: Yes    Comment: occasionally   Drug use: No   Sexual activity: Not on file  Other Topics Concern   Not on file  Social History Narrative   Not on file   Social Determinants of Health   Financial Resource Strain: Not on file  Food Insecurity: Not on file  Transportation Needs: Not on file  Physical Activity: Not on file  Stress: Not on file  Social Connections: Not on file  Intimate Partner Violence: Not on file    Medications:   Current Outpatient Medications on File Prior to Visit  Medication Sig Dispense Refill   aspirin 81 MG chewable tablet Chew 1 tablet (81 mg total) by mouth daily.     atorvastatin (LIPITOR) 80 MG tablet Take 1 tablet (80 mg total) by mouth daily. 30 tablet 2   clopidogrel (PLAVIX) 75 MG tablet Take 1 tablet (75 mg total) by mouth daily. 30 tablet 2   metoprolol tartrate (LOPRESSOR) 25 MG tablet Take 1 tablet (25 mg total) by mouth 2 (two) times daily. 60 tablet 2   ondansetron (ZOFRAN) 4 MG tablet Take 1 tablet (4 mg total) by mouth every 6 (six) hours. 12 tablet 0   No current facility-administered medications on file prior to visit.    Allergies:   Allergies  Allergen Reactions   Banana Hives, Shortness Of Breath and Itching    Physical Exam General: well developed, well nourished  young African-American male, seated, in no evident distress Head: head normocephalic and atraumatic.   Neck: supple with no carotid or supraclavicular bruits Cardiovascular: regular rate and rhythm, no murmurs Musculoskeletal: no deformity Skin:  no rash/petichiae Vascular:  Normal pulses all extremities  Neurologic Exam Mental Status: Awake and fully alert. Oriented to place and time. Recent and remote memory intact. Attention span, concentration and fund of knowledge appropriate. Mood and affect appropriate.  Cranial Nerves: Fundoscopic exam reveals sharp disc margins. Pupils equal, briskly reactive to light. Extraocular movements full without nystagmus but dysmetric saccades on right gaze.. Visual fields full to confrontation. Hearing intact. Facial sensation intact. Face, tongue, palate moves normally and symmetrically.  Motor: Normal bulk and tone. Normal strength in all tested extremity muscles. Sensory.: intact to touch , pinprick , position and vibratory sensation except diminished touch pinprick sensation on the right half of the face  including the  forehead.  Coordination: Rapid alternating movements normal in all extremities. Finger-to-nose and heel-to-shin performed accurately bilaterally. Gait and Station: Arises from chair without difficulty. Stance is normal. Gait demonstrates normal stride length and balance . Able to heel, toe and tandem walk without difficulty.  Reflexes: 1+ and symmetric. Toes downgoing.   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  Vascular risk factors of hypertension, hyperlipidemia vertebral artery dissection and transient A-fib    PLAN:I had a long d/w patient about his recent cerebellar stroke and vertebral artery dissection and coiling, risk for recurrent stroke/TIAs,  personally independently reviewed imaging studies and stroke evaluation results and answered questions.Continue Plavix 75 mg daily for secondary stroke prevention and maintain strict control of hypertension with blood pressure goal below 130/90, diabetes with hemoglobin A1c goal below 6.5% and lipids with LDL cholesterol goal below 70 mg/dL. I also advised the patient to eat a healthy diet with plenty of whole grains, cereals, fruits and vegetables, exercise regularly and maintain ideal body weight .check MRI scan of the brain with and without contrast to evaluate for facial numbness ordered 30-day heart monitor to look for paroxysmal A-fib.  Followup in the future with my nurse practitioner in 6 months or call earlier if necessary.  Greater than 50% time during this 45-minute consultation visit was spent on coordination of care about a cerebellar stroke and vertebral artery dissection about stroke prevention and Delia Heady, MD  Note: This document was prepared with digital dictation and possible smart phrase technology. Any transcriptional errors that result from this process are unintentional.

## 2022-04-29 NOTE — Patient Instructions (Addendum)
I had a long d/w patient about his recent cerebellar stroke and vertebral artery dissection and coiling, risk for recurrent stroke/TIAs, personally independently reviewed imaging studies and stroke evaluation results and answered questions.Continue Plavix 75 mg daily for secondary stroke prevention and maintain strict control of hypertension with blood pressure goal below 130/90, diabetes with hemoglobin A1c goal below 6.5% and lipids with LDL cholesterol goal below 70 mg/dL. I also advised the patient to eat a healthy diet with plenty of whole grains, cereals, fruits and vegetables, exercise regularly and maintain ideal body weight .check MRI scan of the brain with and without contrast to evaluate for facial numbness ordered 30-day heart monitor to look for paroxysmal A-fib.  Followup in the future with my nurse practitioner in 6 months or call earlier if necessary.  Stroke Prevention Some medical conditions and behaviors can lead to a higher chance of having a stroke. You can help prevent a stroke by eating healthy, exercising, not smoking, and managing any medical conditions you have. Stroke is a leading cause of functional impairment. Primary prevention is particularly important because a majority of strokes are first-time events. Stroke changes the lives of not only those who experience a stroke but also their family and other caregivers. How can this condition affect me? A stroke is a medical emergency and should be treated right away. A stroke can lead to brain damage and can sometimes be life-threatening. If a person gets medical treatment right away, there is a better chance of surviving and recovering from a stroke. What can increase my risk? The following medical conditions may increase your risk of a stroke: Cardiovascular disease. High blood pressure (hypertension). Diabetes. High cholesterol. Sickle cell disease. Blood clotting disorders (hypercoagulable state). Obesity. Sleep disorders  (obstructive sleep apnea). Other risk factors include: Being older than age 90. Having a history of blood clots, stroke, or mini-stroke (transient ischemic attack, TIA). Genetic factors, such as race, ethnicity, or a family history of stroke. Smoking cigarettes or using other tobacco products. Taking birth control pills, especially if you also use tobacco. Heavy use of alcohol or drugs, especially cocaine and methamphetamine. Physical inactivity. What actions can I take to prevent this? Manage your health conditions High cholesterol levels. Eating a healthy diet is important for preventing high cholesterol. If cholesterol cannot be managed through diet alone, you may need to take medicines. Take any prescribed medicines to control your cholesterol as told by your health care provider. Hypertension. To reduce your risk of stroke, try to keep your blood pressure below 130/80. Eating a healthy diet and exercising regularly are important for controlling blood pressure. If these steps are not enough to manage your blood pressure, you may need to take medicines. Take any prescribed medicines to control hypertension as told by your health care provider. Ask your health care provider if you should monitor your blood pressure at home. Have your blood pressure checked every year, even if your blood pressure is normal. Blood pressure increases with age and some medical conditions. Diabetes. Eating a healthy diet and exercising regularly are important parts of managing your blood sugar (glucose). If your blood sugar cannot be managed through diet and exercise, you may need to take medicines. Take any prescribed medicines to control your diabetes as told by your health care provider. Get evaluated for obstructive sleep apnea. Talk to your health care provider about getting a sleep evaluation if you snore a lot or have excessive sleepiness. Make sure that any other medical conditions you have,  such as  atrial fibrillation or atherosclerosis, are managed. Nutrition Follow instructions from your health care provider about what to eat or drink to help manage your health condition. These instructions may include: Reducing your daily calorie intake. Limiting how much salt (sodium) you use to 1,500 milligrams (mg) each day. Using only healthy fats for cooking, such as olive oil, canola oil, or sunflower oil. Eating healthy foods. You can do this by: Choosing foods that are high in fiber, such as whole grains, and fresh fruits and vegetables. Eating at least 5 servings of fruits and vegetables a day. Try to fill one-half of your plate with fruits and vegetables at each meal. Choosing lean protein foods, such as lean cuts of meat, poultry without skin, fish, tofu, beans, and nuts. Eating low-fat dairy products. Avoiding foods that are high in sodium. This can help lower blood pressure. Avoiding foods that have saturated fat, trans fat, and cholesterol. This can help prevent high cholesterol. Avoiding processed and prepared foods. Counting your daily carbohydrate intake.  Lifestyle If you drink alcohol: Limit how much you have to: 0-1 drink a day for women who are not pregnant. 0-2 drinks a day for men. Know how much alcohol is in your drink. In the [place] one drink equals one 12 oz bottle of beer (332m), one 5 oz glass of wine (1425m, or one 1 oz glass of hard liquor (4490m Do not use any products that contain nicotine or tobacco. These products include cigarettes, chewing tobacco, and vaping devices, such as e-cigarettes. If you need help quitting, ask your health care provider. Avoid secondhand smoke. Do not use drugs. Activity  Try to stay at a healthy weight. Get at least 30 minutes of exercise on most days, such as: Fast walking. Biking. Swimming. Medicines Take over-the-counter and prescription medicines only as told by your health care provider. Aspirin or blood thinners  (antiplatelets or anticoagulants) may be recommended to reduce your risk of forming blood clots that can lead to stroke. Avoid taking birth control pills. Talk to your health care provider about the risks of taking birth control pills if: You are over 35 44ars old. You smoke. You get very bad headaches. You have had a blood clot. Where to find more information American Stroke Association: www.strokeassociation.org Get help right away if: You or a loved one has any symptoms of a stroke. "BE FAST" is an easy way to remember the main warning signs of a stroke: B - Balance. Signs are dizziness, sudden trouble walking, or loss of balance. E - Eyes. Signs are trouble seeing or a sudden change in vision. F - Face. Signs are sudden weakness or numbness of the face, or the face or eyelid drooping on one side. A - Arms. Signs are weakness or numbness in an arm. This happens suddenly and usually on one side of the body. S - Speech. Signs are sudden trouble speaking, slurred speech, or trouble understanding what people say. T - Time. Time to call emergency services. Write down what time symptoms started. You or a loved one has other signs of a stroke, such as: A sudden, severe headache with no known cause. Nausea or vomiting. Seizure. These symptoms may represent a serious problem that is an emergency. Do not wait to see if the symptoms will go away. Get medical help right away. Call your local emergency services (911 in the [place] Do not drive yourself to the hospital. Summary You can help to prevent a stroke by eating healthy,  exercising, not smoking, limiting alcohol intake, and managing any medical conditions you may have. Do not use any products that contain nicotine or tobacco. These include cigarettes, chewing tobacco, and vaping devices, such as e-cigarettes. If you need help quitting, ask your health care provider. Remember "BE FAST" for warning signs of a stroke. Get help right away if you or a  loved one has any of these signs. This information is not intended to replace advice given to you by your health care provider. Make sure you discuss any questions you have with your health care provider. Document Revised: 01/17/2020 Document Reviewed: 01/17/2020 Elsevier Patient Education  [place].

## 2022-04-30 LAB — LIPID PANEL
Chol/HDL Ratio: 4.6 ratio (ref 0.0–5.0)
Cholesterol, Total: 193 mg/dL (ref 100–199)
HDL: 42 mg/dL (ref >39)
LDL Chol Calc (NIH): 134 mg/dL — ABNORMAL HIGH (ref 0–99)
Triglycerides: 91 mg/dL (ref 0–149)
VLDL Cholesterol Cal: 17 mg/dL (ref 5–40)

## 2022-04-30 LAB — HEMOGLOBIN A1C
Est. average glucose Bld gHb Est-mCnc: 111 mg/dL
Hgb A1c MFr Bld: 5.5 % (ref 4.8–5.6)

## 2022-05-01 ENCOUNTER — Telehealth: Payer: Self-pay

## 2022-05-01 ENCOUNTER — Other Ambulatory Visit: Payer: Self-pay | Admitting: Neurology

## 2022-05-01 DIAGNOSIS — I639 Cerebral infarction, unspecified: Secondary | ICD-10-CM

## 2022-05-01 DIAGNOSIS — I480 Paroxysmal atrial fibrillation: Secondary | ICD-10-CM

## 2022-05-01 NOTE — Telephone Encounter (Signed)
Received call from the patient wanting to check the status of his monitor.   Upon investigation the patient saw Dr Leonie Man and the monitor was ordered to be put on at [facility].   I will follow up with [facility] and our monitor dept and get back with the patient. Patient agreeable and voiced understanding.

## 2022-05-02 ENCOUNTER — Telehealth: Payer: Self-pay | Admitting: Neurology

## 2022-05-02 NOTE — Telephone Encounter (Signed)
Attempted to reach the patient but his mail box was full.

## 2022-05-02 NOTE — Telephone Encounter (Signed)
[facility] Josem Kaufmann: [id number] exp. 01/21/22-07/20/22 sent to GI [phone]

## 2022-05-05 NOTE — Progress Notes (Signed)
Kindly inform the patient that lipid profile shows that his bad cholesterol is still high and ask him if he is compliant with taking his Lipitor as present results are unchanged from March 2023 despite being on Lipitor maximum dose 80 mg daily.  If he is indeed taking Lipitor without fail her options are to switch to adding new medications which are in the form of injections and will need insurance approval

## 2022-05-07 NOTE — Telephone Encounter (Signed)
Spoke with the patient and informed him that the monitor order has been reordered and mailed to him . The patient states he received a test from [facility] stating a package will be delivered tomorrow. I advised the patient to use the instructions in the box but to contact the office with any questions. Patient voiced understanding and thanked me for calling.

## 2022-05-20 ENCOUNTER — Other Ambulatory Visit: Payer: No Typology Code available for payment source

## 2022-05-26 ENCOUNTER — Encounter (HOSPITAL_BASED_OUTPATIENT_CLINIC_OR_DEPARTMENT_OTHER): Payer: Self-pay | Admitting: Emergency Medicine

## 2022-05-26 ENCOUNTER — Emergency Department (HOSPITAL_BASED_OUTPATIENT_CLINIC_OR_DEPARTMENT_OTHER)
Admission: EM | Admit: 2022-05-26 | Discharge: 2022-05-27 | Disposition: A | Discharge status: Home / Self Care | Payer: No Typology Code available for payment source | Attending: Emergency Medicine | Admitting: Emergency Medicine

## 2022-05-26 ENCOUNTER — Other Ambulatory Visit: Payer: Self-pay

## 2022-05-26 DIAGNOSIS — I480 Paroxysmal atrial fibrillation: Secondary | ICD-10-CM | POA: Insufficient documentation

## 2022-05-26 DIAGNOSIS — R002 Palpitations: Secondary | ICD-10-CM | POA: Diagnosis present

## 2022-05-26 DIAGNOSIS — E876 Hypokalemia: Secondary | ICD-10-CM | POA: Diagnosis not present

## 2022-05-26 DIAGNOSIS — Z7901 Long term (current) use of anticoagulants: Secondary | ICD-10-CM | POA: Diagnosis not present

## 2022-05-26 NOTE — ED Triage Notes (Signed)
Pt here from home with c/o heart racing / fluttering , may have a hx of afib but he is not sure , is on a blood thinner from a stroke in march

## 2022-05-27 ENCOUNTER — Emergency Department (HOSPITAL_BASED_OUTPATIENT_CLINIC_OR_DEPARTMENT_OTHER): Payer: No Typology Code available for payment source

## 2022-05-27 ENCOUNTER — Telehealth (HOSPITAL_COMMUNITY): Payer: Self-pay

## 2022-05-27 LAB — CBC
HCT: 45.2 % (ref 39.0–52.0)
Hemoglobin: 14.8 g/dL (ref 13.0–17.0)
MCH: 27.7 pg (ref 26.0–34.0)
MCHC: 32.7 g/dL (ref 30.0–36.0)
MCV: 84.5 fL (ref 80.0–100.0)
Platelets: 196 10*3/uL (ref 150–400)
RBC: 5.35 MIL/uL (ref 4.22–5.81)
RDW: 13.7 % (ref 11.5–15.5)
WBC: 10.2 10*3/uL (ref 4.0–10.5)
nRBC: 0 % (ref 0.0–0.2)

## 2022-05-27 LAB — BASIC METABOLIC PANEL
Anion gap: 9 (ref 5–15)
BUN: 13 mg/dL (ref 6–20)
CO2: 28 mmol/L (ref 22–32)
Calcium: 9.1 mg/dL (ref 8.9–10.3)
Chloride: 103 mmol/L (ref 98–111)
Creatinine, Ser: 1.02 mg/dL (ref 0.61–1.24)
GFR, Estimated: >60 mL/min (ref >60)
Glucose, Bld: 135 mg/dL — ABNORMAL HIGH (ref 70–99)
Potassium: 3.2 mmol/L — ABNORMAL LOW (ref 3.5–5.1)
Sodium: 140 mmol/L (ref 135–145)

## 2022-05-27 LAB — MAGNESIUM: Magnesium: 1.9 mg/dL (ref 1.7–2.4)

## 2022-05-27 MED ORDER — ETOMIDATE 2 MG/ML IV SOLN
INTRAVENOUS | Status: AC | PRN
  Administered 2022-05-27: 4 mg via INTRAVENOUS

## 2022-05-27 MED ORDER — DILTIAZEM HCL-DEXTROSE 125-5 MG/125ML-% IV SOLN (PREMIX)
5.0000 mg/h | INTRAVENOUS | Status: DC
  Administered 2022-05-27: 5 mg/h via INTRAVENOUS
  Filled 2022-05-27: qty 125

## 2022-05-27 MED ORDER — DILTIAZEM LOAD VIA INFUSION
10.0000 mg | Freq: Once | INTRAVENOUS | Status: AC
  Administered 2022-05-27: 10 mg via INTRAVENOUS
  Filled 2022-05-27: qty 10

## 2022-05-27 MED ORDER — ETOMIDATE 2 MG/ML IV SOLN
10.0000 mg | Freq: Once | INTRAVENOUS | Status: AC
  Administered 2022-05-27: 6 mg via INTRAVENOUS
  Filled 2022-05-27: qty 10

## 2022-05-27 MED ORDER — POTASSIUM CHLORIDE 10 MEQ/100ML IV SOLN
10.0000 meq | Freq: Once | INTRAVENOUS | Status: AC
  Administered 2022-05-27: 10 meq via INTRAVENOUS

## 2022-05-27 MED ORDER — APIXABAN 5 MG PO TABS: 5.0000 mg | ORAL_TABLET | Freq: Two times a day (BID) | ORAL | 0 refills | Status: DC

## 2022-05-27 MED ORDER — DILTIAZEM HCL 25 MG/5ML IV SOLN
INTRAVENOUS | Status: AC
  Filled 2022-05-27: qty 5

## 2022-05-27 MED ORDER — POTASSIUM CHLORIDE CRYS ER 20 MEQ PO TBCR
40.0000 meq | EXTENDED_RELEASE_TABLET | Freq: Once | ORAL | Status: AC
  Administered 2022-05-27: 40 meq via ORAL
  Filled 2022-05-27: qty 2

## 2022-05-27 NOTE — ED Provider Notes (Signed)
[facility] EMERGENCY DEPT Provider Note   CSN: [id number] Arrival date & time: 05/26/22  2328     History  Chief Complaint  Patient presents with   Irregular Heart Beat    David Shields is a 31 y.o. male.  HPI     This is a 31 year old male with a history of atrial fibrillation and CVA on Plavix who presents with palpitations.  Patient reports that he got off work this evening and ate dinner as normal.  He went to bed and woke up with palpitations around 11p.  He states that he frequently will wake up at night with palpitations but feels that this is related to anxiety and is able to breathe through it.  However tonight, his symptoms persisted and this feels different.  Denies chest pain or shortness of breath.  He does report compliance with his Plavix and states he is taking it every morning as directed.  No recent changes in medications.  Denies alcohol or drug use.  Home Medications Prior to Admission medications   Medication Sig Start Date End Date Taking? Authorizing Provider  apixaban (ELIQUIS) 5 MG TABS tablet Take 1 tablet (5 mg total) by mouth 2 (two) times daily. 05/27/22 06/26/22 Yes Egor Fullilove, Barbette Hair, MD  atorvastatin (LIPITOR) 80 MG tablet Take 1 tablet (80 mg total) by mouth daily. 09/18/21   Rinehuls, Early Chars, PA-C  metoprolol tartrate (LOPRESSOR) 25 MG tablet Take 1 tablet (25 mg total) by mouth 2 (two) times daily. 09/17/21   Rinehuls, David L, PA-C  ondansetron (ZOFRAN) 4 MG tablet Take 1 tablet (4 mg total) by mouth every 6 (six) hours. 02/23/22   Fransico Meadow, MD      Allergies    Banana    Review of Systems   Review of Systems  Constitutional:  Negative for fever.  Respiratory:  Negative for shortness of breath.   Cardiovascular:  Positive for palpitations. Negative for chest pain.  All other systems reviewed and are negative.   Physical Exam Updated Vital Signs BP (!) 125/95   Pulse 93   Temp 98.6 F (37 C) (Oral)   Resp  (!) 22   SpO2 98%  Physical Exam Vitals and nursing note reviewed.  Constitutional:      Appearance: He is well-developed.  HENT:     Head: Normocephalic and atraumatic.  Eyes:     Pupils: Pupils are equal, round, and reactive to light.  Cardiovascular:     Rate and Rhythm: Tachycardia present. Rhythm irregular.     Heart sounds: Normal heart sounds. No murmur heard. Pulmonary:     Effort: Pulmonary effort is normal. No respiratory distress.     Breath sounds: Normal breath sounds. No wheezing.  Abdominal:     Palpations: Abdomen is soft.     Tenderness: There is no abdominal tenderness. There is no rebound.  Musculoskeletal:     Cervical back: Neck supple.     Right lower leg: No edema.     Left lower leg: No edema.  Lymphadenopathy:     Cervical: No cervical adenopathy.  Skin:    General: Skin is warm and dry.  Neurological:     Mental Status: He is alert and oriented to person, place, and time.  Psychiatric:        Mood and Affect: Mood normal.     ED Results / Procedures / Treatments   Labs (all labs ordered are listed, but only abnormal results are displayed) Labs Reviewed  BASIC METABOLIC PANEL - Abnormal; Notable for the following components:      Result Value   Potassium 3.2 (*)    Glucose, Bld 135 (*)    All other components within normal limits  MAGNESIUM  CBC    EKG EKG Interpretation  Date/Time:  Sunday May 26 2022 23:48:23 EST Ventricular Rate:  101 PR Interval:    QRS Duration: 103 QT Interval:  356 QTC Calculation: 462 R Axis:   57 Text Interpretation: Atrial fibrillation Baseline wander in lead(s) III now in atrial fibrillation Confirmed by Thayer Jew [phone]) on 05/27/2022 12:03:17 AM    EKG Interpretation  Date/Time:  Monday May 27 2022 01:30:28 EST Ventricular Rate:  113 PR Interval:  155 QRS Duration: 97 QT Interval:  318 QTC Calculation: 436 R Axis:   46 Text Interpretation: Sinus tachycardia Baseline wander in  lead(s) II III aVF V4 Confirmed by Thayer Jew [phone]) on 05/27/2022 2:17:31 AM        Radiology DG Chest Port 1 View  Result Date: 05/27/2022 CLINICAL DATA:  Atrial fibrillation EXAM: PORTABLE CHEST 1 VIEW COMPARISON:  02/23/2022 FINDINGS: The heart size and mediastinal contours are within normal limits. Both lungs are clear. The visualized skeletal structures are unremarkable. IMPRESSION: No active disease. Electronically Signed   By: Inez Catalina M.D.   On: 05/27/2022 00:24    Procedures .Cardioversion  Date/Time: 05/27/2022 2:17 AM  Performed by: Merryl Hacker, MD Authorized by: Merryl Hacker, MD   Consent:    Consent obtained:  Written   Consent given by:  Patient   Risks discussed:  Cutaneous burn, death, induced arrhythmia and pain   Alternatives discussed:  Rate-control medication Pre-procedure details:    Cardioversion basis:  Emergent   Rhythm:  Atrial fibrillation   Electrode placement:  Anterior-posterior Patient sedated: Yes. Refer to sedation procedure documentation for details of sedation.  Attempt one:    Cardioversion mode:  Synchronous   Shock (Joules):  200   Shock outcome:  Conversion to normal sinus rhythm Post-procedure details:    Patient status:  Awake   Patient tolerance of procedure:  Tolerated well, no immediate complications .Sedation  Date/Time: 05/27/2022 2:19 AM  Performed by: Merryl Hacker, MD Authorized by: Merryl Hacker, MD   Consent:    Consent obtained:  Written   Consent given by:  Patient   Risks discussed:  Prolonged hypoxia resulting in organ damage, prolonged sedation necessitating reversal and dysrhythmia Universal protocol:    Immediately prior to procedure, a time out was called: yes   Indications:    Procedure performed:  Cardioversion   Procedure necessitating sedation performed by:  Physician performing sedation Pre-sedation assessment:    Time since last food or drink:  2000   NPO status  caution: urgency dictates proceeding with non-ideal NPO status     ASA classification: class 2 - patient with mild systemic disease     Mouth opening:  3 or more finger widths   Thyromental distance:  4 finger widths   Mallampati score:  I - soft palate, uvula, fauces, pillars visible   Neck mobility: normal     Pre-sedation assessments completed and reviewed: airway patency, cardiovascular function, hydration status, mental status, nausea/vomiting and pain level     Pre-sedation assessment completed:  05/27/2022 1:30 AM Immediate pre-procedure details:    Reassessment: Patient reassessed immediately prior to procedure     Reviewed: vital signs     Verified: bag valve mask available, emergency equipment  available, intubation equipment available and IV patency confirmed   Procedure details (see MAR for exact dosages):    Preoxygenation:  Nasal cannula   Sedation:  Etomidate   Intended level of sedation: deep   Intra-procedure monitoring:  Blood pressure monitoring, continuous capnometry, continuous pulse oximetry and cardiac monitor   Intra-procedure events: none     Total Provider sedation time (minutes):  10 Post-procedure details:    Post-sedation assessment completed:  05/27/2022 2:20 AM   Attendance: Constant attendance by certified staff until patient recovered     Recovery: Patient returned to pre-procedure baseline     Patient is stable for discharge or admission: yes     Procedure completion:  Tolerated well, no immediate complications .Critical Care  Performed by: Shon Baton, MD Authorized by: Shon Baton, MD   Critical care provider statement:    Critical care time (minutes):  40   Critical care was necessary to treat or prevent imminent or life-threatening deterioration of the following conditions: arrythmia.   Critical care was time spent personally by me on the following activities:  Development of treatment plan with patient or surrogate, discussions with  consultants, evaluation of patient's response to treatment, examination of patient, ordering and review of laboratory studies, ordering and review of radiographic studies, ordering and performing treatments and interventions, pulse oximetry, re-evaluation of patient's condition and review of old charts     Medications Ordered in ED Medications  diltiazem (CARDIZEM) 1 mg/mL load via infusion 10 mg (10 mg Intravenous Bolus from Bag 05/27/22 0030)    And  diltiazem (CARDIZEM) 125 mg in dextrose 5% 125 mL (1 mg/mL) infusion (0 mg/hr Intravenous Hold 05/27/22 0030)  potassium chloride 10 mEq in 100 mL IVPB (0 mEq Intravenous Stopped 05/27/22 0218)  potassium chloride SA (KLOR-CON M) CR tablet 40 mEq (40 mEq Oral Given 05/27/22 0044)  etomidate (AMIDATE) injection 10 mg (6 mg Intravenous Given 05/27/22 0123)  etomidate (AMIDATE) injection (4 mg Intravenous Given 05/27/22 0126)    ED Course/ Medical Decision Making/ A&P Clinical Course as of 05/27/22 0234  Mon May 27, 2022  0207 Patient is awake and alert.  States he feels much better.  He is now in sinus rhythm. [CH]    Clinical Course User Index [CH] Kendel Bessey, Mayer Masker, MD                           Medical Decision Making Amount and/or Complexity of Data Reviewed Labs: ordered. Radiology: ordered.  Risk Prescription drug management.    Chadsvasc 2  This patient presents to the ED for concern of palpitations, this involves an extensive number of treatment options, and is a complaint that carries with it a high risk of complications and morbidity.  I considered the following differential and admission for this acute, potentially life threatening condition.  The differential diagnosis includes arrhythmia such as atrial fibrillation, metabolic derangements, anxiety  MDM:    This is a 31 year old male who presents with palpitations.  History of paroxysmal atrial fibrillation and resultant CVA.  He is on Plavix and aspirin.  Reports he  takes these daily.  He has been symptomatic for less than 1 hour prior to arrival.  Heart rates up into the 170s although he is otherwise hemodynamically stable.  Lab work obtained.  Given that he is reliable with his medications and has a pretty clear onset of symptoms, feel that he is a candidate for possible cardioversion.  We discussed risk and benefits of cardioversion as well as the alternative of rate controlling medications.  He was given 1 dose of 10 mg diltiazem.  Rates continue to fluctuate significantly.  For this reason he opted for cardioversion.  He tolerated this procedure well and converted to sinus rhythm.  No immediate complications.   His potassium was noted to be slightly low.  This was replaced with IV and oral.  I discussed with the patient that he should stop taking his Plavix and should start taking Eliquis given his CHA2DS2-VASc of 2.  He was provided with a prescription for this.  Patient states understanding.  He will follow-up with cardiology.  He remained hemodynamically stable post cardioversion and was ambulatory in the emergency department.  (Labs, imaging, consults)  Labs: I Ordered, and personally interpreted labs.  The pertinent results include: CBC, BMP, magnesium  Imaging Studies ordered: I ordered imaging studies including chest x-ray I independently visualized and interpreted imaging. I agree with the radiologist interpretation  Additional history obtained from chart review.  External records from outside source obtained and reviewed including outpatient neurology and cardiology notes  Cardiac Monitoring: The patient was maintained on a cardiac monitor.  I personally viewed and interpreted the cardiac monitored which showed an underlying rhythm of: Atrial fibrillation  Reevaluation: After the interventions noted above, I reevaluated the patient and found that they have :improved  Social Determinants of Health: Lives with wife  Disposition: Discharge with  close cardiology follow-up, Eliquis prescription provided.  Co morbidities that complicate the patient evaluation  Past Medical History:  Diagnosis Date   Anxiety    PAF (paroxysmal atrial fibrillation) (HCC)    Severe left ventricular hypertrophy    Stroke (cerebrum) (HCC)    Vertebral artery dissection (HCC)      Medicines Meds ordered this encounter  Medications   AND Linked Order Group    diltiazem (CARDIZEM) 1 mg/mL load via infusion 10 mg    diltiazem (CARDIZEM) 125 mg in dextrose 5% 125 mL (1 mg/mL) infusion   DISCONTD: diltiazem (CARDIZEM) 25 MG/5ML injection    Ermelinda Das M: cabinet override   potassium chloride 10 mEq in 100 mL IVPB   potassium chloride SA (KLOR-CON M) CR tablet 40 mEq   etomidate (AMIDATE) injection 10 mg   etomidate (AMIDATE) injection   apixaban (ELIQUIS) 5 MG TABS tablet    Sig: Take 1 tablet (5 mg total) by mouth 2 (two) times daily.    Dispense:  60 tablet    Refill:  0    I have reviewed the patients home medicines and have made adjustments as needed  Problem List / ED Course: Problem List Items Addressed This Visit   None Visit Diagnoses     Paroxysmal atrial fibrillation with RVR (HCC)    -  Primary   Relevant Medications   diltiazem (CARDIZEM) 1 mg/mL load via infusion 10 mg (Completed)   diltiazem (CARDIZEM) 125 mg in dextrose 5% 125 mL (1 mg/mL) infusion   apixaban (ELIQUIS) 5 MG TABS tablet   Hypokalemia                       Final Clinical Impression(s) / ED Diagnoses Final diagnoses:  Paroxysmal atrial fibrillation with RVR (HCC)  Hypokalemia    Rx / DC Orders ED Discharge Orders          Ordered    Amb referral to AFIB Clinic        05/27/22  0004    apixaban (ELIQUIS) 5 MG TABS tablet  2 times daily        05/27/22 0231              Daiveon Markman, Barbette Hair, MD 05/27/22 [phone]

## 2022-05-27 NOTE — Telephone Encounter (Signed)
Reached out to patient to schedule ED f/u appointment. No answer left voicemail. 

## 2022-05-27 NOTE — Sedation Documentation (Signed)
Unable to rate pain due to procedure.  

## 2022-05-27 NOTE — ED Notes (Signed)
Pt agreeable with d/c plan as discussed by provider- this nurse has verbally reinforced d/c instructions and provided pt with written cop - emphasized plan for pt to d/c Plavix and fill prescription for Eliquis (1 tab BID) - pt acknowledges verbal understanding - also reinforced importance of following up with outpt A-Fib clinic and cardiology.  Pt denies any c/o pain, sob, dizziness -- escorted to exit via w/c with wife outside to drive patient home.

## 2022-05-27 NOTE — ED Notes (Signed)
Patient transported to X-ray 

## 2022-05-27 NOTE — ED Notes (Signed)
Pt awake and alert - GCS 15.  Pt on continuous cardiac and pulse ox monitoring - pt continues to report fluttering/heart racing.  Denies associated dizziness, HA, weakness/fatigue, sob, peripheral edema.  Lung sounds CTA.  S1 and S2 irregular with auscultation.  Abdomen soft, nontender - pt denies GI complaints; states having normal bowel movements -- denies urinary complaints.  Skin warm dry and intact.  Pt awaits PCXR and lab results.  Will continue to monitor for acute changes and maintain plan of care.

## 2022-05-27 NOTE — Discharge Instructions (Signed)
You were seen today for atrial fibrillation.  You were cardioverted without any complications.  Follow-up closely with your cardiologist.  Take your medications as prescribed.

## 2022-05-27 NOTE — ED Notes (Signed)
10mg  bolus from bag now completed

## 2022-05-27 NOTE — ED Notes (Signed)
Late entry -- Cardiac monitoring alarming at nurses station -- Uncontrolled A-Fib rate as high as 170 - now reading 141 on monitor.  Dr. Wilkie Aye made aware.

## 2022-09-05 ENCOUNTER — Other Ambulatory Visit: Payer: Self-pay

## 2022-09-05 DIAGNOSIS — R0789 Other chest pain: Secondary | ICD-10-CM | POA: Insufficient documentation

## 2022-09-05 DIAGNOSIS — Z7901 Long term (current) use of anticoagulants: Secondary | ICD-10-CM | POA: Insufficient documentation

## 2022-09-06 ENCOUNTER — Other Ambulatory Visit: Payer: Self-pay

## 2022-09-06 ENCOUNTER — Encounter (HOSPITAL_BASED_OUTPATIENT_CLINIC_OR_DEPARTMENT_OTHER): Payer: Self-pay

## 2022-09-06 ENCOUNTER — Emergency Department (HOSPITAL_BASED_OUTPATIENT_CLINIC_OR_DEPARTMENT_OTHER)
Admission: EM | Admit: 2022-09-06 | Discharge: 2022-09-06 | Disposition: A | Discharge status: Home / Self Care | Payer: No Typology Code available for payment source | Attending: Emergency Medicine | Admitting: Emergency Medicine

## 2022-09-06 ENCOUNTER — Emergency Department (HOSPITAL_BASED_OUTPATIENT_CLINIC_OR_DEPARTMENT_OTHER): Payer: No Typology Code available for payment source | Admitting: Radiology

## 2022-09-06 DIAGNOSIS — R0789 Other chest pain: Secondary | ICD-10-CM

## 2022-09-06 LAB — CBC
HCT: 44.9 % (ref 39.0–52.0)
Hemoglobin: 14.8 g/dL (ref 13.0–17.0)
MCH: 27.7 pg (ref 26.0–34.0)
MCHC: 33 g/dL (ref 30.0–36.0)
MCV: 84.1 fL (ref 80.0–100.0)
Platelets: 196 10*3/uL (ref 150–400)
RBC: 5.34 MIL/uL (ref 4.22–5.81)
RDW: 13.3 % (ref 11.5–15.5)
WBC: 6.2 10*3/uL (ref 4.0–10.5)
nRBC: 0 % (ref 0.0–0.2)

## 2022-09-06 LAB — BASIC METABOLIC PANEL
Anion gap: 8 (ref 5–15)
BUN: 15 mg/dL (ref 6–20)
CO2: 26 mmol/L (ref 22–32)
Calcium: 9.3 mg/dL (ref 8.9–10.3)
Chloride: 105 mmol/L (ref 98–111)
Creatinine, Ser: 1 mg/dL (ref 0.61–1.24)
GFR, Estimated: >60 mL/min (ref >60)
Glucose, Bld: 99 mg/dL (ref 70–99)
Potassium: 3.4 mmol/L — ABNORMAL LOW (ref 3.5–5.1)
Sodium: 139 mmol/L (ref 135–145)

## 2022-09-06 LAB — TROPONIN I (HIGH SENSITIVITY): Troponin I (High Sensitivity): 5 ng/L (ref <18)

## 2022-09-06 NOTE — ED Triage Notes (Signed)
Patient arrives to ED POV c/o central chest pain. Pt states he was in bed when he felt his chest getting tight. No other complaints at this time.

## 2022-09-06 NOTE — ED Provider Notes (Signed)
[facility]  Provider Note  CSN: [id number] Arrival date & time: 09/05/22 2347  History Chief Complaint  Patient presents with   Chest Pain    JACERE TORTORELLA is a 32 y.o. 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 No racing heart. He is compliant with ASA/Plavix. Was seen in the ED for rapid Afib about 3 months ago and given Rx for Eliquis after cardioversion but did not get it filled and has instead continued the Plavix. He denies any prior known CAD. He is currently pain free.    Home Medications Prior to Admission medications   Medication Sig Start Date End Date Taking? Authorizing Provider  apixaban (ELIQUIS) 5 MG TABS tablet Take 1 tablet (5 mg total) by mouth 2 (two) times daily. 05/27/22 06/26/22  Horton, Barbette Hair, MD  atorvastatin (LIPITOR) 80 MG tablet Take 1 tablet (80 mg total) by mouth daily. 09/18/21   Rinehuls, Early Chars, PA-C  metoprolol tartrate (LOPRESSOR) 25 MG tablet Take 1 tablet (25 mg total) by mouth 2 (two) times daily. 09/17/21   Rinehuls, David L, PA-C  ondansetron (ZOFRAN) 4 MG tablet Take 1 tablet (4 mg total) by mouth every 6 (six) hours. 02/23/22   Fransico Meadow, MD     Allergies    Banana   Review of Systems   Review of Systems Please see HPI for pertinent positives and negatives  Physical Exam BP 113/84   Pulse 74   Temp 97.8 F (36.6 C)   Resp 17   Ht 6' (1.829 m)   Wt 100.7 kg   SpO2 97%   BMI 30.11 kg/m   Physical Exam Vitals and nursing note reviewed.  Constitutional:      Appearance: Normal appearance.  HENT:     Head: Normocephalic and atraumatic.     Nose: Nose normal.     Mouth/Throat:     Mouth: Mucous membranes are moist.  Eyes:     Extraocular Movements: Extraocular movements intact.      Conjunctiva/sclera: Conjunctivae normal.  Cardiovascular:     Rate and Rhythm: Normal rate.  Pulmonary:     Effort: Pulmonary effort is normal.     Breath sounds: Normal breath sounds.  Abdominal:     General: Abdomen is flat.     Palpations: Abdomen is soft.     Tenderness: There is no abdominal tenderness.  Musculoskeletal:        General: No swelling. Normal range of motion.     Cervical back: Neck supple.  Skin:    General: Skin is warm and dry.  Neurological:     General: No focal deficit present.     Mental Status: He is alert.  Psychiatric:        Mood and Affect: Mood normal.     ED Results / Procedures / Treatments   EKG None  Procedures Procedures  Medications Ordered in the ED Medications - No data to display  Initial Impression and Plan  Patient here with CP, atypical in nature. Has resolved prior to my evaluation. Has prior stroke from vertebral artery dissection but no other PVD risk factors. He had EKG that was NSR. CBC, BMP and Trop are neg. I personally viewed the images from radiology studies and agree with radiologist interpretation:  CXR is clear. I discussed with the patient that given minimal time since onset of symptoms would recommend a repeat trop to rule out AMI. He reports he is feeling better now and wants to go home. He understands that without a delta we cannot fully rule out a cardiac cause of his symptoms but he is insistent he wants to go home now. Advised to continue his medications as directed, RTED if pain returns or for any other concerns,   ED Course       MDM Rules/Calculators/A&P Medical Decision Making Given presenting complaint, I considered that admission might be necessary. After review of results from ED lab and/or imaging studies, admission to the hospital is not indicated at this time.    Problems Addressed: Atypical chest pain: acute illness or injury  Amount and/or Complexity of Data Reviewed Labs: ordered.  Decision-making details documented in ED Course. Radiology: ordered and independent interpretation performed. Decision-making details documented in ED Course. ECG/medicine tests: ordered and independent interpretation performed. Decision-making details documented in ED Course.  Risk Prescription drug management. Decision regarding hospitalization.     Final Clinical Impression(s) / ED Diagnoses Final diagnoses:  Atypical chest pain    Rx / DC Orders ED Discharge Orders     None        Truddie Hidden, MD 09/06/22 [phone]

## 2022-10-09 ENCOUNTER — Ambulatory Visit: Payer: No Typology Code available for payment source | Admitting: Neurology

## 2022-10-09 ENCOUNTER — Encounter: Payer: Self-pay | Admitting: Neurology

## 2022-10-09 NOTE — Progress Notes (Deleted)
Patient: David Shields Date of Birth: 11/07/1990  Reason for Visit: Follow up History from: Patient Primary Neurologist: Pearlean BrownieSethi  ASSESSMENT AND PLAN 32 y.o. 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  Vascular risk factors of hypertension, hyperlipidemia vertebral artery dissection and transient A-fib    HISTORY OF PRESENT ILLNESS: Today 10/09/22 MRI of the brain with and without contrast was ordered at last visit due to reported facial numbness.  He did not have it done.  30-day cardiac monitor was ordered as well, not completed.  He was advised to remain on Plavix 75 mg daily with strict management of vascular risk factors.   HISTORY  04/29/22 Dr. Pearlean BrownieSethi HPI: Mr. David Shields is a 32 year old African-American male seen today for initial office consultation visit for stroke.  History is obtained from the patient and review of electronic medical records and I personally reviewed pertinent available imaging films in PACS.David Shields is a 32 y.o. male with PMH significant for anxiety who went to bed at 2100 on 09/15/2021 and woke up an hour later, attempted to get out of bed and fell immediately. He seemed off so family called 911. He was noted to have a L gaze preference, R sided ataxia, dysconjugate gaze, headache and vomited. He was brought in to the ED as code stroke. On arrival, symptoms were persistent. CTH w/o contrast negative for a large hypodensity concerning for a large territory infarct or hyperdensity concerning for an ICH. He was offered tNKASE but initially hesitant and not comfortable with the associated risk of ICH but wanted to wait to discuss with his wife. He and wife eventually opted for tNKASE. CT angio head and neck with concern for possible R vert dissection at V2/3 with patent V4 and no basilar occlusion,  bilateral PCA patent, R and L PICA appear patent. CT Perfusion inconclusive due to motion.STAT MRI Brain was obtained to clarify if the noted dissection resulted in any significant strokes. MRI Brain was notable for punctate stroke in the medulla and a punctate stroke in the R cerbellum. He had worsening of his deficit and developed somnolence, R facial droop, worsening ataxia and reported new numbness. A STAT CTH was negative for an ICH. Code IR was activated at this time to get emergent diagnostic angiogram and for potential intervention if needed. Dr. Corliss Skainseveshwar discussed details, risks and benefits of intervention with patient and his wife inclding 10% risk of ICH and they consented to intervention and possible stenting.  LKW: 2100 on 09/15/21 mRS: 0 tNKASE: yes offered. Discussed risks and benefits with patient and he was hesitant to make a decision without speaking with his wife. I spoke to patient's wife and they were hesitant but eventually consented to tNKASE. Thrombectomy: emergent diagnostic angiogram and possible intervention was offered when his exam worsened and exam out of proportion to the MRI findings. NIHSS - 6 Dr. Corliss Skainseveshwar took the patient for intervention but was unable to successfully deploy a stent and had to do therapeutic occlusion of the right V3, V4 segment of the right vertebral artery.  Procedure was uneventful.  Was kept in the ICU close neurological monitoring blood pressures currently controlled.  He was started on dual antiplatelet therapy aspirin and Plavix.  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  Ejection fraction 50 to 55%.  LDL cholesterol was 130 mg percent.  Globin A1c was 5.4.  Patient was discharged home with outpatient speech therapy as well.  He states his balance and recovered completely back to normal.  He has stopped aspirin currently on  Plavix alone.  States his blood pressures under good control today it is 130/89.  He is tolerating Plavix without bruising or bleeding.  Remains on Lipitor tolerating well without the right side of his face months or so..  There are no clear triggers for this. He was seen in the cardiology clinic in April 2023 and plan was to undergo cardiac MRI 30-day heart monitor for A-fib for unclear reasons without  REVIEW OF SYSTEMS: Out of a complete 14 system review of symptoms, the patient complains only of the following symptoms, and all other reviewed systems are negative.  See HPI  ALLERGIES: Allergies  Allergen Reactions   Banana Hives, Shortness Of Breath and Itching    HOME MEDICATIONS: Outpatient Medications Prior to Visit  Medication Sig Dispense Refill   apixaban (ELIQUIS) 5 MG TABS tablet Take 1 tablet (5 mg total) by mouth 2 (two) times daily. 60 tablet 0   atorvastatin (LIPITOR) 80 MG tablet Take 1 tablet (80 mg total) by mouth daily. 30 tablet 2   metoprolol tartrate (LOPRESSOR) 25 MG tablet Take 1 tablet (25 mg total) by mouth 2 (two) times daily. 60 tablet 2   ondansetron (ZOFRAN) 4 MG tablet Take 1 tablet (4 mg total) by mouth every 6 (six) hours. 12 tablet 0   No facility-administered medications prior to visit.    PAST MEDICAL HISTORY: Past Medical History:  Diagnosis Date   Anxiety    PAF (paroxysmal atrial fibrillation) (HCC)    Severe left ventricular hypertrophy    Stroke (cerebrum) (HCC)    Vertebral artery dissection (HCC)     PAST SURGICAL HISTORY: Past Surgical History:  Procedure Laterality Date   ADENOIDECTOMY     IR ANGIO INTRA EXTRACRAN SEL COM CAROTID INNOMINATE BILAT MOD SED  09/15/2021   IR ANGIO VERTEBRAL SEL VERTEBRAL UNI L MOD SED  09/15/2021   IR ANGIOGRAM FOLLOW UP STUDY  09/15/2021   IR ANGIOGRAM FOLLOW UP STUDY  09/15/2021   IR ANGIOGRAM FOLLOW UP STUDY  09/15/2021   IR ANGIOGRAM FOLLOW UP STUDY  09/15/2021   IR ANGIOGRAM FOLLOW UP STUDY  09/15/2021    IR ANGIOGRAM FOLLOW UP STUDY  09/15/2021   IR ANGIOGRAM FOLLOW UP STUDY  09/15/2021   IR ANGIOGRAM FOLLOW UP STUDY  09/15/2021   IR ANGIOGRAM FOLLOW UP STUDY  09/15/2021   IR ANGIOGRAM FOLLOW UP STUDY  09/15/2021   IR ANGIOGRAM FOLLOW UP STUDY  09/15/2021   IR ANGIOGRAM FOLLOW UP STUDY  09/15/2021   IR CT HEAD LTD  09/15/2021   IR PERCUTANEOUS ART THROMBECTOMY/INFUSION INTRACRANIAL INC DIAG ANGIO  09/15/2021   IR TRANSCATH/EMBOLIZ  09/15/2021   RADIOLOGY WITH ANESTHESIA N/A 09/15/2021   Procedure: IR WITH ANESTHESIA;  Surgeon: Julieanne Cotton, MD;  Location: [facility] OR;  Service: Radiology;  Laterality: N/A;    FAMILY HISTORY: Family History  Problem Relation Age of Onset   Depression Mother    Cancer Paternal Uncle    Cancer Maternal Grandmother        breast    Hypertension Maternal Grandmother     SOCIAL HISTORY: Social History   Socioeconomic History   Marital status: Single    Spouse name: Not on file   Number  of children: Not on file   Years of education: Not on file   Highest education level: Not on file  Occupational History   Not on file  Tobacco Use   Smoking status: Some Days    Packs/day: .5    Types: Cigarettes, Cigars   Smokeless tobacco: Never  Vaping Use   Vaping Use: Never used  Substance and Sexual Activity   Alcohol use: Yes    Comment: occasionally   Drug use: No   Sexual activity: Not on file  Other Topics Concern   Not on file  Social History Narrative   Not on file   Social Determinants of Health   Financial Resource Strain: Not on file  Food Insecurity: Not on file  Transportation Needs: Not on file  Physical Activity: Not on file  Stress: Not on file  Social Connections: Not on file  Intimate Partner Violence: Not on file    PHYSICAL EXAM  There were no vitals filed for this visit. There is no height or weight on file to calculate BMI.  Generalized: Well developed, in no acute distress  Neurological examination  Mentation: Alert  oriented to time, place, history taking. Follows all commands speech and language fluent Cranial nerve II-XII: Pupils were equal round reactive to light. Extraocular movements were full, visual field were full on confrontational test. Facial sensation and strength were normal. Uvula tongue midline. Head turning and shoulder shrug  were normal and symmetric. Motor: The motor testing reveals 5 over 5 strength of all 4 extremities. Good symmetric motor tone is noted throughout.  Sensory: Sensory testing is intact to soft touch on all 4 extremities. No evidence of extinction is noted.  Coordination: Cerebellar testing reveals good finger-nose-finger and heel-to-shin bilaterally.  Gait and station: Gait is normal. Tandem gait is normal. Romberg is negative. No drift is seen.  Reflexes: Deep tendon reflexes are symmetric and normal bilaterally.   DIAGNOSTIC DATA (LABS, IMAGING, TESTING) - I reviewed patient records, labs, notes, testing and imaging myself where available.  Lab Results  Component Value Date   WBC 6.2 09/06/2022   HGB 14.8 09/06/2022   HCT 44.9 09/06/2022   MCV 84.1 09/06/2022   PLT 196 09/06/2022      Component Value Date/Time   NA 139 09/06/2022 0021   NA 142 10/25/2021 1530   K 3.4 (L) 09/06/2022 0021   CL 105 09/06/2022 0021   CO2 26 09/06/2022 0021   GLUCOSE 99 09/06/2022 0021   BUN 15 09/06/2022 0021   BUN 8 10/25/2021 1530   CREATININE 1.00 09/06/2022 0021   CALCIUM 9.3 09/06/2022 0021   PROT 6.2 (L) 09/16/2021 0348   ALBUMIN 3.3 (L) 09/16/2021 0348   AST 30 09/16/2021 0348   ALT 39 09/16/2021 0348   ALKPHOS 64 09/16/2021 0348   BILITOT 1.3 (H) 09/16/2021 0348   GFRNONAA >60 09/06/2022 0021   GFRAA >60 02/28/2015 1556   Lab Results  Component Value Date   CHOL 193 04/29/2022   HDL 42 04/29/2022   LDLCALC 134 (H) 04/29/2022   TRIG 91 04/29/2022   CHOLHDL 4.6 04/29/2022   Lab Results  Component Value Date   HGBA1C 5.5 04/29/2022   No results found  for: "VITAMINB12" Lab Results  Component Value Date   TSH 0.990 09/15/2021    Margie Ege, AGNP-C, DNP 10/09/2022, 8:25 AM [facility] [address] [place], [place] [address] [phone]

## 2022-12-25 ENCOUNTER — Ambulatory Visit
Admission: RE | Admit: 2022-12-25 | Discharge: 2022-12-25 | Disposition: A | Discharge status: Home / Self Care | Payer: No Typology Code available for payment source | Source: Ambulatory Visit | Attending: Urgent Care | Admitting: Urgent Care

## 2022-12-25 VITALS — BP 135/90 | HR 85 | Temp 98.1°F | Resp 18

## 2022-12-25 DIAGNOSIS — Z113 Encounter for screening for infections with a predominantly sexual mode of transmission: Secondary | ICD-10-CM | POA: Insufficient documentation

## 2022-12-25 NOTE — ED Triage Notes (Signed)
Patient req STD testing screening. Asymptomatic.

## 2022-12-25 NOTE — Discharge Instructions (Addendum)
Results of today's testing will be posted to your myChart account when available. If there is a positive result requiring treatment, you will be contacted by a nurse by telephone.

## 2022-12-25 NOTE — ED Provider Notes (Signed)
David Shields    CSN: [id number] Arrival date & time: 12/25/22  0825      History   Chief Complaint Chief Complaint  Patient presents with   Anxiety    Entered by patient    HPI David Shields is a 32 y.o. male.    Anxiety  Patient scheduled appointment at urgent care with chief complaint of "anxiety".  However the patient states he will follow-up with his anxiety complaint at the [place].  Requesting STD screen.  Denies any symptoms.  PMH includes anxiety and panic, CVA, vertebral artery dissection, A-fib.  Past Medical History:  Diagnosis Date   Anxiety    PAF (paroxysmal atrial fibrillation) (HCC)    Severe left ventricular hypertrophy    Stroke (cerebrum) (HCC)    Vertebral artery dissection [facility])     Patient Active Problem List   Diagnosis Date Noted   Acute ischemic multifocal posterior circulation stroke involving right-sided vessel (HCC) 09/15/2021   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   Vertebral artery dissection (HCC)    Atrial fibrillation (HCC)    Tinea versicolor 02/01/2013   Insomnia 02/01/2013   Anxiety 09/19/2011   Panic attack 09/19/2011   Tobacco user 09/19/2011    Past Surgical History:  Procedure Laterality Date   ADENOIDECTOMY     IR ANGIO INTRA EXTRACRAN SEL COM CAROTID INNOMINATE BILAT MOD SED  09/15/2021   IR ANGIO VERTEBRAL SEL VERTEBRAL UNI L MOD SED  09/15/2021   IR ANGIOGRAM FOLLOW UP STUDY  09/15/2021   IR ANGIOGRAM FOLLOW UP STUDY  09/15/2021   IR ANGIOGRAM FOLLOW UP STUDY  09/15/2021   IR ANGIOGRAM FOLLOW UP STUDY  09/15/2021   IR ANGIOGRAM FOLLOW UP STUDY  09/15/2021   IR ANGIOGRAM FOLLOW UP STUDY  09/15/2021   IR ANGIOGRAM FOLLOW UP STUDY  09/15/2021   IR ANGIOGRAM FOLLOW UP STUDY  09/15/2021   IR ANGIOGRAM FOLLOW UP STUDY  09/15/2021   IR ANGIOGRAM FOLLOW UP STUDY  09/15/2021   IR ANGIOGRAM FOLLOW UP STUDY  09/15/2021   IR ANGIOGRAM FOLLOW UP STUDY  09/15/2021   IR CT HEAD LTD  09/15/2021   IR  PERCUTANEOUS ART THROMBECTOMY/INFUSION INTRACRANIAL INC DIAG ANGIO  09/15/2021   IR TRANSCATH/EMBOLIZ  09/15/2021   RADIOLOGY WITH ANESTHESIA N/A 09/15/2021   Procedure: IR WITH ANESTHESIA;  Surgeon: Julieanne Cotton, MD;  Location: [facility] OR;  Service: Radiology;  Laterality: N/A;       Home Medications    Prior to Admission medications   Medication Sig Start Date End Date Taking? Authorizing Provider  apixaban (ELIQUIS) 5 MG TABS tablet Take 1 tablet (5 mg total) by mouth 2 (two) times daily. 05/27/22 06/26/22  Horton, Mayer Masker, MD  atorvastatin (LIPITOR) 80 MG tablet Take 1 tablet (80 mg total) by mouth daily. 09/18/21   Rinehuls, Kinnie Scales, PA-C  metoprolol tartrate (LOPRESSOR) 25 MG tablet Take 1 tablet (25 mg total) by mouth 2 (two) times daily. 09/17/21   Rinehuls, David L, PA-C  ondansetron (ZOFRAN) 4 MG tablet Take 1 tablet (4 mg total) by mouth every 6 (six) hours. 02/23/22   Rondel Baton, MD    Family History Family History  Problem Relation Age of Onset   Depression Mother    Cancer Paternal Uncle    Cancer Maternal Grandmother        breast    Hypertension Maternal Grandmother     Social History Social History   Tobacco Use   Smoking  status: Some Days    Packs/day: .5    Types: Cigarettes, Cigars   Smokeless tobacco: Never  Vaping Use   Vaping Use: Never used  Substance Use Topics   Alcohol use: Yes    Comment: occasionally   Drug use: No     Allergies   Banana   Review of Systems Review of Systems   Physical Exam Triage Vital Signs ED Triage Vitals  Enc Vitals Group     BP      Pulse      Resp      Temp      Temp src      SpO2      Weight      Height      Head Circumference      Peak Flow      Pain Score      Pain Loc      Pain Edu?      Excl. in GC?    No data found.  Updated Vital Signs There were no vitals taken for this visit.  Visual Acuity Right Eye Distance:   Left Eye Distance:   Bilateral Distance:    Right Eye  Near:   Left Eye Near:    Bilateral Near:     Physical Exam Vitals reviewed.  Constitutional:      Appearance: Normal appearance.  Skin:    General: Skin is warm and dry.  Neurological:     General: No focal deficit present.     Mental Status: He is alert and oriented to person, place, and time.  Psychiatric:        Mood and Affect: Mood normal.        Behavior: Behavior normal.      UC Treatments / Results  Labs (all labs ordered are listed, but only abnormal results are displayed) Labs Reviewed - No data to display  EKG   Radiology No results found.  Procedures Procedures (including critical care time)  Medications Ordered in UC Medications - No data to display  Initial Impression / Assessment and Plan / UC Course  I have reviewed the triage vital signs and the nursing notes.  Pertinent labs & imaging results that were available during my care of the patient were reviewed by me and considered in my medical decision making (see chart for details).   David Shields is a 32 y.o. male presenting with Anxiety. Patient is afebrile without recent antipyretics, satting well on room air. Overall is well appearing though non-toxic, well hydrated, without respiratory distress.  Reviewed relevant chart history.  Urethral self-swab is obtained from the patient and results pending.  Provided patient with contact information for [facility] behavioral health urgent care.  He plans to follow-up with his anxiety complaint at the [place].   Final Clinical Impressions(s) / UC Diagnoses   Final diagnoses:  None   Discharge Instructions   None    ED Prescriptions   None    PDMP not reviewed this encounter.   Charma Igo, [place] 12/25/22 [phone]

## 2022-12-26 LAB — CYTOLOGY, (ORAL, ANAL, URETHRAL) ANCILLARY ONLY
Chlamydia: NEGATIVE
Comment: NEGATIVE
Comment: NORMAL
Neisseria Gonorrhea: NEGATIVE

## 2023-01-13 ENCOUNTER — Observation Stay: Payer: No Typology Code available for payment source

## 2023-01-13 ENCOUNTER — Emergency Department: Payer: No Typology Code available for payment source

## 2023-01-13 ENCOUNTER — Other Ambulatory Visit (HOSPITAL_COMMUNITY): Payer: Self-pay

## 2023-01-13 ENCOUNTER — Observation Stay
Admission: EM | Admit: 2023-01-13 | Discharge: 2023-01-14 | Disposition: A | Discharge status: Home / Self Care | Payer: No Typology Code available for payment source | Attending: Internal Medicine | Admitting: Internal Medicine

## 2023-01-13 ENCOUNTER — Encounter: Payer: Self-pay | Admitting: Radiology

## 2023-01-13 ENCOUNTER — Observation Stay (HOSPITAL_BASED_OUTPATIENT_CLINIC_OR_DEPARTMENT_OTHER)
Admit: 2023-01-13 | Discharge: 2023-01-13 | Disposition: A | Discharge status: Home / Self Care | Payer: No Typology Code available for payment source | Attending: Internal Medicine | Admitting: Internal Medicine

## 2023-01-13 DIAGNOSIS — I480 Paroxysmal atrial fibrillation: Secondary | ICD-10-CM

## 2023-01-13 DIAGNOSIS — Z79899 Other long term (current) drug therapy: Secondary | ICD-10-CM | POA: Insufficient documentation

## 2023-01-13 DIAGNOSIS — Z8673 Personal history of transient ischemic attack (TIA), and cerebral infarction without residual deficits: Secondary | ICD-10-CM | POA: Diagnosis not present

## 2023-01-13 DIAGNOSIS — E876 Hypokalemia: Secondary | ICD-10-CM | POA: Diagnosis present

## 2023-01-13 DIAGNOSIS — R200 Anesthesia of skin: Principal | ICD-10-CM | POA: Insufficient documentation

## 2023-01-13 DIAGNOSIS — I7774 Dissection of vertebral artery: Secondary | ICD-10-CM | POA: Diagnosis not present

## 2023-01-13 DIAGNOSIS — I6389 Other cerebral infarction: Secondary | ICD-10-CM

## 2023-01-13 DIAGNOSIS — R29898 Other symptoms and signs involving the musculoskeletal system: Secondary | ICD-10-CM | POA: Diagnosis not present

## 2023-01-13 DIAGNOSIS — R202 Paresthesia of skin: Secondary | ICD-10-CM | POA: Diagnosis not present

## 2023-01-13 DIAGNOSIS — R29818 Other symptoms and signs involving the nervous system: Secondary | ICD-10-CM

## 2023-01-13 DIAGNOSIS — I639 Cerebral infarction, unspecified: Secondary | ICD-10-CM

## 2023-01-13 DIAGNOSIS — Z7902 Long term (current) use of antithrombotics/antiplatelets: Secondary | ICD-10-CM | POA: Diagnosis not present

## 2023-01-13 DIAGNOSIS — Z7901 Long term (current) use of anticoagulants: Secondary | ICD-10-CM | POA: Diagnosis not present

## 2023-01-13 DIAGNOSIS — G459 Transient cerebral ischemic attack, unspecified: Secondary | ICD-10-CM

## 2023-01-13 DIAGNOSIS — Z7982 Long term (current) use of aspirin: Secondary | ICD-10-CM | POA: Insufficient documentation

## 2023-01-13 DIAGNOSIS — Z720 Tobacco use: Secondary | ICD-10-CM

## 2023-01-13 DIAGNOSIS — E785 Hyperlipidemia, unspecified: Secondary | ICD-10-CM | POA: Diagnosis not present

## 2023-01-13 DIAGNOSIS — F17210 Nicotine dependence, cigarettes, uncomplicated: Secondary | ICD-10-CM | POA: Diagnosis not present

## 2023-01-13 DIAGNOSIS — F418 Other specified anxiety disorders: Secondary | ICD-10-CM

## 2023-01-13 LAB — URINE DRUG SCREEN, QUALITATIVE (ARMC ONLY)
Amphetamines, Ur Screen: NOT DETECTED
Barbiturates, Ur Screen: NOT DETECTED
Benzodiazepine, Ur Scrn: NOT DETECTED
Cannabinoid 50 Ng, Ur ~~LOC~~: NOT DETECTED
Cocaine Metabolite,Ur ~~LOC~~: NOT DETECTED
MDMA (Ecstasy)Ur Screen: NOT DETECTED
Methadone Scn, Ur: NOT DETECTED
Opiate, Ur Screen: NOT DETECTED
Phencyclidine (PCP) Ur S: NOT DETECTED
Tricyclic, Ur Screen: NOT DETECTED

## 2023-01-13 LAB — DIFFERENTIAL
Abs Immature Granulocytes: 0.01 10*3/uL (ref 0.00–0.07)
Basophils Absolute: 0 10*3/uL (ref 0.0–0.1)
Basophils Relative: 0 %
Eosinophils Absolute: 0.1 10*3/uL (ref 0.0–0.5)
Eosinophils Relative: 1 %
Immature Granulocytes: 0 %
Lymphocytes Relative: 47 %
Lymphs Abs: 3.4 10*3/uL (ref 0.7–4.0)
Monocytes Absolute: 0.4 10*3/uL (ref 0.1–1.0)
Monocytes Relative: 5 %
Neutro Abs: 3.4 10*3/uL (ref 1.7–7.7)
Neutrophils Relative %: 47 %

## 2023-01-13 LAB — MAGNESIUM: Magnesium: 2.1 mg/dL (ref 1.7–2.4)

## 2023-01-13 LAB — CBC
HCT: 46.7 % (ref 39.0–52.0)
Hemoglobin: 15.2 g/dL (ref 13.0–17.0)
MCH: 27.2 pg (ref 26.0–34.0)
MCHC: 32.5 g/dL (ref 30.0–36.0)
MCV: 83.5 fL (ref 80.0–100.0)
Platelets: 200 10*3/uL (ref 150–400)
RBC: 5.59 MIL/uL (ref 4.22–5.81)
RDW: 13.2 % (ref 11.5–15.5)
WBC: 7.3 10*3/uL (ref 4.0–10.5)
nRBC: 0 % (ref 0.0–0.2)

## 2023-01-13 LAB — COMPREHENSIVE METABOLIC PANEL
ALT: 39 U/L (ref 0–44)
AST: 25 U/L (ref 15–41)
Albumin: 4.1 g/dL (ref 3.5–5.0)
Alkaline Phosphatase: 67 U/L (ref 38–126)
Anion gap: 7 (ref 5–15)
BUN: 13 mg/dL (ref 6–20)
CO2: 25 mmol/L (ref 22–32)
Calcium: 8.6 mg/dL — ABNORMAL LOW (ref 8.9–10.3)
Chloride: 103 mmol/L (ref 98–111)
Creatinine, Ser: 0.92 mg/dL (ref 0.61–1.24)
GFR, Estimated: >60 mL/min (ref >60)
Glucose, Bld: 114 mg/dL — ABNORMAL HIGH (ref 70–99)
Potassium: 3.2 mmol/L — ABNORMAL LOW (ref 3.5–5.1)
Sodium: 135 mmol/L (ref 135–145)
Total Bilirubin: 0.8 mg/dL (ref 0.3–1.2)
Total Protein: 7.5 g/dL (ref 6.5–8.1)

## 2023-01-13 LAB — HIV ANTIBODY (ROUTINE TESTING W REFLEX): HIV Screen 4th Generation wRfx: NONREACTIVE

## 2023-01-13 LAB — ECHOCARDIOGRAM COMPLETE BUBBLE STUDY
AR max vel: 3.47 cm2
AV Area VTI: 3.45 cm2
AV Area mean vel: 3.22 cm2
AV Mean grad: 2 mmHg
AV Peak grad: 3.9 mmHg
Ao pk vel: 0.99 m/s
Area-P 1/2: 3.27 cm2
MV VTI: 3.6 cm2
S' Lateral: 4.1 cm

## 2023-01-13 LAB — HEMOGLOBIN A1C
Hgb A1c MFr Bld: 5.4 % (ref 4.8–5.6)
Mean Plasma Glucose: 108.28 mg/dL

## 2023-01-13 LAB — CBG MONITORING, ED: Glucose-Capillary: 112 mg/dL — ABNORMAL HIGH (ref 70–99)

## 2023-01-13 LAB — APTT: aPTT: 29 seconds (ref 24–36)

## 2023-01-13 LAB — PROTIME-INR
INR: 1 (ref 0.8–1.2)
Prothrombin Time: 13.8 seconds (ref 11.4–15.2)

## 2023-01-13 LAB — ETHANOL: Alcohol, Ethyl (B): <10 mg/dL (ref <10)

## 2023-01-13 MED ORDER — ENOXAPARIN SODIUM 40 MG/0.4ML IJ SOSY: 40.0000 mg | PREFILLED_SYRINGE | INTRAMUSCULAR | Status: DC

## 2023-01-13 MED ORDER — POTASSIUM CHLORIDE CRYS ER 20 MEQ PO TBCR
40.0000 meq | EXTENDED_RELEASE_TABLET | Freq: Once | ORAL | Status: AC
  Administered 2023-01-13: 40 meq via ORAL
  Filled 2023-01-13: qty 2

## 2023-01-13 MED ORDER — HYDRALAZINE HCL 20 MG/ML IJ SOLN: 5.0000 mg | INTRAMUSCULAR | Status: DC | PRN

## 2023-01-13 MED ORDER — ACETAMINOPHEN 160 MG/5ML PO SOLN: 650.0000 mg | ORAL | Status: DC | PRN

## 2023-01-13 MED ORDER — METOPROLOL TARTRATE 5 MG/5ML IV SOLN: 5.0000 mg | INTRAVENOUS | Status: DC | PRN

## 2023-01-13 MED ORDER — ASPIRIN 325 MG PO TBEC
325.0000 mg | DELAYED_RELEASE_TABLET | Freq: Once | ORAL | Status: AC
  Administered 2023-01-13: 325 mg via ORAL
  Filled 2023-01-13: qty 1

## 2023-01-13 MED ORDER — ATORVASTATIN CALCIUM 20 MG PO TABS
80.0000 mg | ORAL_TABLET | Freq: Every day | ORAL | Status: DC
  Administered 2023-01-13 – 2023-01-14 (×2): 80 mg via ORAL
  Filled 2023-01-13 (×2): qty 4

## 2023-01-13 MED ORDER — CLOPIDOGREL BISULFATE 75 MG PO TABS
75.0000 mg | ORAL_TABLET | Freq: Every day | ORAL | Status: DC
  Administered 2023-01-13 – 2023-01-14 (×2): 75 mg via ORAL
  Filled 2023-01-13 (×2): qty 1

## 2023-01-13 MED ORDER — ACETAMINOPHEN 650 MG RE SUPP: 650.0000 mg | RECTAL | Status: DC | PRN

## 2023-01-13 MED ORDER — SENNOSIDES-DOCUSATE SODIUM 8.6-50 MG PO TABS: 1.0000 | ORAL_TABLET | Freq: Every evening | ORAL | Status: DC | PRN

## 2023-01-13 MED ORDER — ACETAMINOPHEN 325 MG PO TABS
650.0000 mg | ORAL_TABLET | ORAL | Status: DC | PRN
  Administered 2023-01-13: 650 mg via ORAL
  Filled 2023-01-13: qty 2

## 2023-01-13 MED ORDER — SODIUM CHLORIDE 0.9% FLUSH
3.0000 mL | Freq: Once | INTRAVENOUS | Status: AC
  Administered 2023-01-13: 3 mL via INTRAVENOUS

## 2023-01-13 MED ORDER — STROKE: EARLY STAGES OF RECOVERY BOOK: Freq: Once | Status: AC

## 2023-01-13 MED ORDER — ONDANSETRON HCL 4 MG/2ML IJ SOLN: 4.0000 mg | Freq: Three times a day (TID) | INTRAMUSCULAR | Status: DC | PRN

## 2023-01-13 MED ORDER — ALUM & MAG HYDROXIDE-SIMETH 200-200-20 MG/5ML PO SUSP
30.0000 mL | Freq: Once | ORAL | Status: AC
  Administered 2023-01-13: 30 mL via ORAL
  Filled 2023-01-13: qty 30

## 2023-01-13 MED ORDER — ASPIRIN 81 MG PO TBEC
81.0000 mg | DELAYED_RELEASE_TABLET | Freq: Every day | ORAL | Status: DC
  Administered 2023-01-14: 81 mg via ORAL
  Filled 2023-01-13: qty 1

## 2023-01-13 MED ORDER — IOHEXOL 350 MG/ML SOLN
75.0000 mL | Freq: Once | INTRAVENOUS | Status: AC | PRN
  Administered 2023-01-13: 75 mL via INTRAVENOUS

## 2023-01-13 MED ORDER — NICOTINE 21 MG/24HR TD PT24
21.0000 mg | MEDICATED_PATCH | Freq: Every day | TRANSDERMAL | Status: DC
  Filled 2023-01-13: qty 1

## 2023-01-13 NOTE — ED Triage Notes (Signed)
C/O right arm numbness x 2 days today c/o lightheadedness and dizziness. States numbness has been intermittent x 2 days. States numbness had subsided last night when going to bed and returned this monring at 0530.  Went to bed last night at around 10 pm.

## 2023-01-13 NOTE — Progress Notes (Addendum)
SLP Cancellation Note  Patient Details Name: David Shields MRN: [id number] DOB: 06-10-1991   Cancelled treatment:       Reason Eval/Treat Not Completed: SLP screened, no needs identified, will sign off (chart reviewed; consulted NSG then met w/ pt in ED, now room, 106.)  Pt denied any difficulty swallowing and is currently on a regular diet; tolerates swallowing pills w/ water per NSG. Pt drinking gingerale during visit.  Pt conversed in general conversation w/out overt expressive/receptive deficits noted; pt denied any speech-language deficits. Speech clear, intelligible. He finished a text on the phone and denied any deficits typing or reading texts. He requested food items appropriately given choices, and lunch meal ordered. He made ADL requests appropriately as well and gave h/o his previous stroke. NSG denied any language deficits this morning.  No further skilled ST services indicated as pt appears at his baseline. Pt agreed. NSG to reconsult if any change in status while admitted.      Jerilynn Som, MS, CCC-SLP Speech Language Pathologist Rehab Services; [facility] [phone] (ascom) Conrad Zajkowski 01/13/2023, 12:54 PM

## 2023-01-13 NOTE — Progress Notes (Signed)
*  PRELIMINARY RESULTS* Echocardiogram 2D Echocardiogram has been performed.  Carolyne Fiscal 01/13/2023, 2:59 PM

## 2023-01-13 NOTE — ED Notes (Signed)
Pt complaining of heart burn. Md Ray notified via secure chat.

## 2023-01-13 NOTE — Evaluation (Signed)
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  Last known well 10 PM before he went to bed and he woke up with symptoms at 530.  He has had fluctuating right arm and face numbness for the past 2 days but it had resolved prior to going to bed.   Clinical Impression   Patient received for OT evaluation. See flowsheet below for details of function. Generally, patient independent for bed mobility, SBA for functional mobility (pt dizzy and reaching for objects while walking to transport chair), and SBA for ADLs.  No new UE deficits (no sensation, strength, or ROM deficits); has old L shoulder ROM deficit. Facial tingling noted on the L during session today. Evaluation time limited as transport arrived to take pt to scan; continue assessment of ADL performance in future session. Patient will benefit from continued OT while in acute care.       Recommendations for follow up therapy are one component of a multi-disciplinary discharge planning process, led by the attending physician.  Recommendations may be updated based on patient status, additional functional criteria and insurance authorization.   Assistance Recommended at Discharge Intermittent Supervision/Assistance  Patient can return home with the following Assistance with cooking/housework;Direct supervision/assist for medications management;Assist for transportation;Help with stairs or ramp for entrance    Functional Status Assessment  Patient has had a recent decline in their functional status and demonstrates the ability to make significant improvements in function in a reasonable and predictable amount of  time.  Equipment Recommendations  Tub/shower seat    Recommendations for Other Services       Precautions / Restrictions Precautions Precautions: Fall Restrictions Weight Bearing Restrictions: No      Mobility Bed Mobility Overal bed mobility: Independent                  Transfers Overall transfer level: Independent                 General transfer comment: OT standing by, but no assist needed.      Balance Overall balance assessment: Mild deficits observed, not formally tested                                         ADL either performed or assessed with clinical judgement   ADL Overall ADL's : Needs assistance/impaired                                       General ADL Comments: Recommend seated ADLs at this time, as pt is slightly dizzy upon standing. Limited in ADL assessment due to transport coming to take pt to test. BIL hand and arm strength functional. Able to walk to the transport chair, but pt gently reaching out for objects (rail of bed, computer cart) for steadying. Recommend participation with PT for balance/mobility assessment as well. Recommend seated showering, seated LB dressing/UB dressing, grooming (or with CGA for safety). Pt with strong BIL hand grip upon testing.     Vision   Additional Comments: Need to  do visual assessment during next session     Perception     Praxis      Pertinent Vitals/Pain Pain Assessment Pain Assessment: No/denies pain     Hand Dominance Left   Extremity/Trunk Assessment Upper Extremity Assessment Upper Extremity Assessment: Overall WFL for tasks assessed;LUE deficits/detail LUE Deficits / Details: Pt states he has pins in his L shoulder; flexion limited to approx 100 degrees; this is baseline for pt.           Communication Communication Communication: No difficulties   Cognition Arousal/Alertness: Awake/alert Behavior During Therapy: WFL for tasks  assessed/performed Overall Cognitive Status: Within Functional Limits for tasks assessed                                 General Comments: Pleasant, follows all commands     General Comments  Pt endorses mild dizziness upon standing; does not resolve. Pt endorses tingling in L side of face. States that R side of face was tingling earlier. Denies numbness/tingling in BIL UE, although states he had that in R side yesterday.    Exercises     Shoulder Instructions      Home Living Family/patient expects to be discharged to:: Private residence Living Arrangements: Spouse/significant other Available Help at Discharge: Family Type of Home: Apartment Home Access: Stairs to enter Secretary/administrator of Steps: flight   Home Layout: One level     Bathroom Shower/Tub: Chief Strategy Officer: Standard     Home Equipment: None   Additional Comments: Pt states he used to have a cane, but does not have one anymore      Prior Functioning/Environment Prior Level of Function : Independent/Modified Independent;Working/employed;Driving             Mobility Comments: Pt (I) with mobility; no AD. ADLs Comments: (I) ADL/IADL. Drives. 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        OT Problem List: Impaired balance (sitting and/or standing)      OT Treatment/Interventions: Self-care/ADL training;Therapeutic exercise;Therapeutic activities;DME and/or AE instruction;Patient/family education    OT Goals(Current goals can be found in the care plan section) Acute Rehab OT Goals Patient Stated Goal: Get better OT Goal Formulation: With patient Time For Goal Achievement: 01/27/23 Potential to Achieve Goals: Good ADL Goals Pt Will Perform Grooming: Independently;standing Pt Will Perform Lower Body Bathing: with modified independence;sit to/from stand Pt Will Perform Lower Body  Dressing: with modified independence;sit to/from stand Pt Will Transfer to Toilet: Independently;ambulating Pt Will Perform Tub/Shower Transfer: Tub transfer;shower seat;ambulating  OT Frequency: Min 1X/week    Co-evaluation              AM-PAC OT "6 Clicks" Daily Activity     Outcome Measure Help from another person eating meals?: None Help from another person taking care of personal grooming?: None Help from another person toileting, which includes using toliet, bedpan, or urinal?: A Little Help from another person bathing (including washing, rinsing, drying)?: A Little Help from another person to put on and taking off regular upper body clothing?: None Help from another person to put on and taking off regular lower body clothing?: A Little 6 Click Score: 21   End of Session Nurse Communication: Mobility status  Activity Tolerance: Patient tolerated treatment well Patient left: Other (comment) (in w/c with transport going for testing)  OT Visit Diagnosis: Unsteadiness on  feet (R26.81)                Time: 7425-9563 OT Time Calculation (min): 9 min Charges:  OT General Charges $OT Visit: 1 Visit OT Evaluation $OT Eval Low Complexity: 1 Low  Linward Foster, MS, OTR/L  Alvester Morin 01/13/2023, 1:42 PM

## 2023-01-13 NOTE — Consult Note (Signed)
NEUROLOGY CONSULTATION NOTE   Date of service: January 13, 2023 Patient Name: David Shields MRN:  [id number] DOB:  Apr 13, 1991 Reason for consult: stroke code Requesting physician: Dr. 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  Last known well 10 PM before he went to bed and he woke up with symptoms at 530.  He has had fluctuating right arm and face numbness for the past 2 days but it had resolved prior to going to bed.  He told his boss at work who was concerned and drove him to the ED in private vehicle. NIHSS = 3 for BLE drift and R face numbness (RUE numbness had resolved). CT head no acute process on personal review. CTA after the stroke code showed no LVO, s/p endovascular occlusion R vert personal review. TNK not administered 2/2 presentation outside the window.  He presented to [facility] in March with left gaze preference, right sided ataxia, disconjugate gaze, headache, and vomiting on September 15, 2021.  He received TNK at that time.  CTA showed right vertebral dissection and he was taken to IR.  Stent was unable to successfully be deployed and therapeutic occlusion of the right V3, V4 segment was performed.  Patient is on Plavix at home.  He does have a history of A-fib and [facility] and unstable A-fib with RVR and had to be cardioverted. Per ED notes he was prescribed eliquis but patient was confused about this, does not recall that conversation, and is not currently taking it.  Short-term healthROS   Per HPI: all other systems reviewed and are negative  Past History   I have reviewed the following:  Past Medical History:  Diagnosis Date   Anxiety    PAF (paroxysmal atrial fibrillation)  (HCC)    Severe left ventricular hypertrophy    Stroke (cerebrum) (HCC)    Vertebral artery dissection (HCC)    Past Surgical History:  Procedure Laterality Date   ADENOIDECTOMY     IR ANGIO INTRA EXTRACRAN SEL COM CAROTID INNOMINATE BILAT MOD SED  09/15/2021   IR ANGIO VERTEBRAL SEL VERTEBRAL UNI L MOD SED  09/15/2021   IR ANGIOGRAM FOLLOW UP STUDY  09/15/2021   IR ANGIOGRAM FOLLOW UP STUDY  09/15/2021   IR ANGIOGRAM FOLLOW UP STUDY  09/15/2021   IR ANGIOGRAM FOLLOW UP STUDY  09/15/2021   IR ANGIOGRAM FOLLOW UP STUDY  09/15/2021   IR ANGIOGRAM FOLLOW UP STUDY  09/15/2021   IR ANGIOGRAM FOLLOW UP STUDY  09/15/2021   IR ANGIOGRAM FOLLOW UP STUDY  09/15/2021   IR ANGIOGRAM FOLLOW UP STUDY  09/15/2021   IR ANGIOGRAM FOLLOW UP STUDY  09/15/2021   IR ANGIOGRAM FOLLOW UP STUDY  09/15/2021   IR ANGIOGRAM FOLLOW UP STUDY  09/15/2021   IR CT HEAD LTD  09/15/2021   IR PERCUTANEOUS ART THROMBECTOMY/INFUSION INTRACRANIAL INC DIAG ANGIO  09/15/2021   IR TRANSCATH/EMBOLIZ  09/15/2021   RADIOLOGY WITH ANESTHESIA N/A 09/15/2021   Procedure: IR WITH ANESTHESIA;  Surgeon: Julieanne Cotton, MD;  Location: [facility] OR;  Service: Radiology;  Laterality: N/A;   Family History  Problem Relation Age of Onset  Depression Mother    Cancer Paternal Uncle    Cancer Maternal Grandmother        breast    Hypertension Maternal Grandmother    Social History   Socioeconomic History   Marital status: Single    Spouse name: Not on file   Number of children: Not on file   Years of education: Not on file   Highest education level: Not on file  Occupational History   Not on file  Tobacco Use   Smoking status: Some Days    Current packs/day: 0.50    Types: Cigarettes, Cigars   Smokeless tobacco: Never  Vaping Use   Vaping status: Never Used  Substance and Sexual Activity   Alcohol use: Yes    Comment: occasionally   Drug use: No   Sexual activity: Not on file  Other Topics Concern   Not on file  Social History  Narrative   Not on file   Social Determinants of Health   Financial Resource Strain: Not on file  Food Insecurity: Not on file  Transportation Needs: Not on file  Physical Activity: Not on file  Stress: Not on file  Social Connections: Not on file   Allergies  Allergen Reactions   Banana Hives, Shortness Of Breath and Itching    Medications   (Not in a hospital admission)    No current facility-administered medications for this encounter.  Current Outpatient Medications:    aspirin EC 81 MG tablet, Take 81 mg by mouth daily., Disp: , Rfl:    apixaban (ELIQUIS) 5 MG TABS tablet, Take 1 tablet (5 mg total) by mouth 2 (two) times daily., Disp: 60 tablet, Rfl: 0   atorvastatin (LIPITOR) 80 MG tablet, Take 1 tablet (80 mg total) by mouth daily., Disp: 30 tablet, Rfl: 2   clopidogrel (PLAVIX) 75 MG tablet, Take by mouth., Disp: , Rfl:    DULoxetine (CYMBALTA) 30 MG capsule, Take 30 mg by mouth 2 (two) times daily., Disp: , Rfl:    metoprolol tartrate (LOPRESSOR) 25 MG tablet, Take 1 tablet (25 mg total) by mouth 2 (two) times daily., Disp: 60 tablet, Rfl: 2   ondansetron (ZOFRAN) 4 MG tablet, Take 1 tablet (4 mg total) by mouth every 6 (six) hours., Disp: 12 tablet, Rfl: 0  Vitals   Vitals:   01/13/23 0822  BP: (!) 154/107  Pulse: (!) 104  Resp: 16  Temp: 98.4 F (36.9 C)  TempSrc: Oral  SpO2: 97%     There is no height or weight on file to calculate BMI.  Physical Exam   Physical Exam Gen: A&O x4, NAD HEENT: Atraumatic, normocephalic;mucous membranes moist; oropharynx clear, tongue without atrophy or fasciculations. Neck: Supple, trachea midline. Resp: CTAB, no w/r/r CV: RRR, no m/g/r; nml S1 and S2. 2+ symmetric peripheral pulses. Abd: soft/NT/ND; nabs x 4 quad Extrem: Nml bulk; no cyanosis, clubbing, or edema.  Neuro: *MS: A&O x4. Follows multi-step commands.  *Speech: fluid, nondysarthric, able to name and repeat *CN:    I: Deferred   II,III: PERRLA, VFF  by confrontation, optic discs unable to be visualized 2/2 pupillary constriction   III,IV,VI: EOMI w/o nystagmus, no ptosis   V: sensory deficit to LT R face   VII: Eyelid closure was full.  Smile symmetric.   VIII: Hearing intact to voice   IX,X: Voice normal, palate elevates symmetrically    XI: SCM/trap 5/5 bilat   XII: Tongue protrudes midline, no atrophy or fasciculations   *Motor:  Normal bulk.  No tremor, rigidity or bradykinesia. No drift in BUE, mild drift not to bed BLE *Sensory: Sensory deficit in RUE resolved, SILT throughout extremities. No double-simultaneous extinction.  *Coordination:  Finger-to-nose, heel-to-shin, rapid alternating motions were intact. *Reflexes:  2+ and symmetric throughout without clonus; toes down-going bilat *Gait: deferred  NIHSS = 3 BLE drift and sensory deficit  Premorbid mRS = 1   Labs   CBC:  Recent Labs  Lab 01/13/23 0855  WBC 7.3  NEUTROABS 3.4  HGB 15.2  HCT 46.7  MCV 83.5  PLT 200    Basic Metabolic Panel:  Lab Results  Component Value Date   NA 135 01/13/2023   K 3.2 (L) 01/13/2023   CO2 25 01/13/2023   GLUCOSE 114 (H) 01/13/2023   BUN 13 01/13/2023   CREATININE 0.92 01/13/2023   CALCIUM 8.6 (L) 01/13/2023   GFRNONAA >60 01/13/2023   GFRAA >60 02/28/2015   Lipid Panel:  Lab Results  Component Value Date   LDLCALC 134 (H) 04/29/2022   HgbA1c:  Lab Results  Component Value Date   HGBA1C 5.5 04/29/2022   Urine Drug Screen:     Component Value Date/Time   LABOPIA NONE DETECTED 09/15/2021 0030   COCAINSCRNUR NONE DETECTED 09/15/2021 0030   LABBENZ NONE DETECTED 09/15/2021 0030   AMPHETMU NONE DETECTED 09/15/2021 0030   THCU NONE DETECTED 09/15/2021 0030   LABBARB NONE DETECTED 09/15/2021 0030    Alcohol Level     Component Value Date/Time   ETH <10 01/13/2023 0855    CT Head without contrast: No acute process  CT angio Head and Neck with contrast: No LVO, endovascular occlusion R vert  CNS  imaging personally reviewed  Impression   This is a 32 year old man with past medical history significant for A-fib, severe LVH, 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  Last known well 10 PM before he went to bed and he woke up with symptoms at 530. NIHSS = 3 for BLE drift and R face numbness (RUE numbness had resolved). CT head no acute process on personal review. CTA after the stroke code showed no LVO, s/p endovascular occlusion R vert personal review. TNK not administered 2/2 presentation outside the window.   Patient is on Plavix at home.  He does have a history of A-fib and [facility] and unstable A-fib with RVR and had to be cardioverted. Per ED notes he was prescribed eliquis but patient was confused about this, does not recall that conversation, and is not currently taking it. His CHADS2-VASc is 2 moderate-to-high risk of thromboembolism and he should be anticoagulated. Will defer until he has MRI in the event that he has an acute stroke, will provide further guidance after MRI is completed.  Recommendations   - Admit for stroke workup - Permissive HTN x48 hrs from sx onset or until stroke ruled out by MRI goal BP <220/110. PRN labetalol or hydralazine if BP above these parameters. Avoid oral antihypertensives. - MRI brain wo contrast - TTE w/ bubble - Check A1c and LDL + add statin per guidelines - ASA 325mg  now f/b 81mg  daily. Continue plavix for now. Patient will need to be started on eliquis but will wait until after MRI results and provide further guidance at that time - q4 hr neuro checks - STAT head CT for any change in neuro exam - Tele - PT/OT/SLP -  Stroke education - Amb referral to neurology and cardiology upon discharge  Will continue to follow  ______________________________________________________________________   Thank you for  the opportunity to take part in the care of this patient. If you have any further questions, please contact the neurology consultation attending.  Signed,  Bing Neighbors, MD [facility] [phone]  If 7pm- 7am, please page neurology on call as listed in AMION.  **Any copied and pasted documentation in this note was written by me in another application not billed for and pasted by me into this document.

## 2023-01-13 NOTE — Evaluation (Cosign Needed Addendum)
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  Last known well 10 PM before he went to bed and he woke up with symptoms at 530.  He has had fluctuating right arm and face numbness for the past 2 days but it had resolved prior to going to bed.  Clinical Impression  Pt is in bed, family present, pt is A&Ox4, denies pain but reports occasional numbness in LUE and face. PTA pt was independent for mobility and ADL's, previously used SPC from prior CVA last year but has fully recovered, lives with wife and kid. Pt performed bed mobility c/ supervision, sit<>stand c/ CGA, ambulation c/ CGA. Pt ambulated ~459ft c/ no AD and CGA, no complaints of fatigue, slight unsteadiness with gait speed changes but no LOB. Pt scored 21 on DGI, indicative of low falls risk. PT assessed strength LUE+LLE 4/5 compared to RUE+RLE 5/5, no coordination sensation deficits but reports occasional tingling in LUE, and single instance of chin tingling momentarily after standing. BP in supine 129/103, seated EOB 121/98, standing 139/99, standing at 136/106. Pt left in bed, needs within reach, alarm set. Pt would benefit from acute therapy services to improve mobility, balance, and maximize independence.     Assistance Recommended at Discharge PRN  If plan is discharge home, recommend the following:  Can travel by private vehicle  A little help with walking and/or transfers;A little help with bathing/dressing/bathroom;Assist for transportation        Equipment Recommendations None recommended by PT  Recommendations for Other Services       Functional Status Assessment Patient has had a recent  decline in their functional status and demonstrates the ability to make significant improvements in function in a reasonable and predictable amount of time.     Precautions / Restrictions Precautions Precautions: Fall Restrictions Weight Bearing Restrictions: No      Mobility  Bed Mobility Overal bed mobility: Needs Assistance Bed Mobility: Supine to Sit, Sit to Supine     Supine to sit: HOB elevated, Supervision Sit to supine: Supervision        Transfers Overall transfer level: Needs assistance Equipment used: None Transfers: Sit to/from Stand Sit to Stand: Min guard                Ambulation/Gait Ambulation/Gait assistance: Min guard Gait Distance (Feet): 450 Feet Assistive device: None Gait Pattern/deviations: Step-through pattern Gait velocity: increased     General Gait Details: CGA, no LOB but slight unsteadiness with gait speed changes,  Stairs            Wheelchair Mobility     Tilt Bed    Modified Rankin (Stroke Patients Only)       Balance Overall balance assessment: Needs assistance Sitting-balance support: Feet supported, No upper extremity supported Sitting balance-Leahy Scale: Normal     Standing balance support: No upper extremity supported Standing balance-Leahy Scale: Good Standing balance comment: able to ambulate c/o UE support CGA, no LOB, slight unsteadiness with gait speed change                 Standardized Balance Assessment Standardized Balance Assessment : Dynamic Gait Index   Dynamic Gait Index Level Surface: Normal Change in Gait Speed:  Mild Impairment Gait with Horizontal Head Turns: Mild Impairment Gait with Vertical Head Turns: Mild Impairment Gait and Pivot Turn: Normal Step Over Obstacle: Normal Step Around Obstacles: Normal Steps: Normal Total Score: 21       Pertinent Vitals/Pain Pain Assessment Pain Assessment: No/denies pain    Home Living Family/patient expects to be discharged  to:: Private residence Living Arrangements: Children;Spouse/significant other Available Help at Discharge: Family;Available PRN/intermittently Type of Home: Apartment Home Access: Stairs to enter Entrance Stairs-Rails: Can reach both Entrance Stairs-Number of Steps: flight   Home Layout: One level Home Equipment: Cane - single point Additional Comments: Pt has SPC but keeps in car, does not use at baseline    Prior Function Prior Level of Function : Independent/Modified Independent;Working/employed;Driving             Mobility Comments: Pt (I) with mobility; no AD. ADLs Comments: (I) ADL/IADL. Drives. 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     Hand Dominance   Dominant Hand: Left    Extremity/Trunk Assessment   Upper Extremity Assessment Upper Extremity Assessment: LUE deficits/detail LUE Deficits / Details: Pt states he has pins in his L shoulder; flexion limited to approx 100 degrees; this is baseline for pt., MMT LUE 4/5 LUE Sensation: WNL (intermittent numbness) LUE Coordination: WNL    Lower Extremity Assessment Lower Extremity Assessment: LLE deficits/detail LLE Deficits / Details: MMT LLE 4/5 LLE Sensation: WNL LLE Coordination: WNL       Communication   Communication: No difficulties  Cognition Arousal/Alertness: Awake/alert Behavior During Therapy: WFL for tasks assessed/performed Overall Cognitive Status: Within Functional Limits for tasks assessed                                 General Comments: Pleasant, follows all commands        General Comments General comments (skin integrity, edema, etc.): Pt reports 2/10 headache during ambulation, subsided <60sec. Pt report tingling on chin after sit>stand, resolved <60 sec.    Exercises Total Joint Exercises Ankle Circles/Pumps: AROM, Both, 10 reps, Seated Long Arc Quad: AROM, Both, 10 reps, Seated Marching  in Standing: AROM, Both, 10 reps, Seated   Assessment/Plan    PT Assessment Patient needs continued PT services  PT Problem List Decreased strength;Decreased knowledge of use of DME;Decreased range of motion;Decreased activity tolerance;Decreased safety awareness;Decreased balance;Decreased mobility;Decreased coordination       PT Treatment Interventions DME instruction;Balance training;Gait training;Neuromuscular re-education;Stair training;Functional mobility training;Patient/family education;Therapeutic activities;Therapeutic exercise    PT Goals (Current goals can be found in the Care Plan section)  Acute Rehab PT Goals Patient Stated Goal: to return to home PT Goal Formulation: With patient Time For Goal Achievement: 01/27/23 Potential to Achieve Goals: Good Additional Goals Additional Goal #1: Pt to score 41 or greater on BERG, to classify as low falls risk. (to promote safe mobility / decrease falls risk)    Frequency Min 1X/week     Co-evaluation               AM-PAC PT "6 Clicks" Mobility  Outcome Measure Help needed turning from your back to your side while in a flat bed without using bedrails?: None Help needed moving from lying on your back to sitting on the side of a flat bed without using bedrails?: None Help needed moving to and from a bed to a chair (including a wheelchair)?: None Help needed  standing up from a chair using your arms (e.g., wheelchair or bedside chair)?: None Help needed to walk in hospital room?: None Help needed climbing 3-5 steps with a railing? : A Little 6 Click Score: 23    End of Session Equipment Utilized During Treatment: Gait belt Activity Tolerance: Patient tolerated treatment well Patient left: in bed;with call bell/phone within reach;with bed alarm set Nurse Communication: Mobility status PT Visit Diagnosis: Other abnormalities of gait and mobility (R26.89);Unsteadiness on feet (R26.81);Other symptoms and signs involving the  nervous system (R29.898)    Time: 4098-1191 PT Time Calculation (min) (ACUTE ONLY): 26 min   Charges:   PT Evaluation $PT Eval Low Complexity: 1 Low PT Treatments $Therapeutic Activity: 23-37 mins PT General Charges $$ ACUTE PT VISIT: 1 Visit        Lala Lund, PT, SPT  4:05 PM,01/13/23

## 2023-01-13 NOTE — ED Notes (Signed)
Called 1c to be connected to the RN assigned to patient, this Rn was transferred to another number which rang and went to voicemail. This RN called 1c again and spoke with secretary about being transferred to a voicemail, Diplomatic Services operational officer stated that it is because he is doing a discharge. This RN asked for RN's name and was told by secretary that his name is [place]. Charge RN [employer] notified.

## 2023-01-13 NOTE — H&P (Addendum)
History and Physical    David Shields:[id number] DOB: [birth year] DOA: 01/13/2023  Referring MD/NP/PA:   PCP: Vertell Limber, PA-C   Patient coming from:  The patient is coming from home.     Chief Complaint: Right arm numbness, right facial numbness  HPI: David Shields is a 32 y.o. 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  Pt states that he has intermittent right arm numbness and right facial numbness for 2 days. He felt better last night at about 10 PM, but symptoms came back this morning.  No weakness in arms or legs.  No facial droop or slurred speech.  Patient does not have chest pain, cough, shortness of breath.  No nausea, vomiting, diarrhea or abdominal pain pain no symptoms of UTI.  Data reviewed independently and ED Course: pt was found to have WBC 7.3, alcohol level<10, potassium 3.2, temperature normal, blood pressure 134/93, heart rate 104, 84, RR 26, oxygen saturation 98% on room air.  CT of head is negative for acute intracranial abnormalities, but showed old stroke.  CTA negative for LVO.  Patient is placed on head of bed for obs. Dr. Selina Cooley of neuro is consulted.   EKG: I have personally reviewed.  Sinus rhythm, QTc 454, LAE, nonspecific T wave change.   Review of Systems:   General: no fevers, chills, no body weight gain, fatigue HEENT: no blurry vision, hearing changes or sore throat Respiratory: no dyspnea, coughing, wheezing CV: no chest pain, no palpitations GI: no nausea, vomiting, abdominal pain, diarrhea, constipation GU: no dysuria, burning on urination, increased urinary frequency, hematuria  Ext: no leg edema Neuro: no vision change or hearing loss.  Has right facial and right arm numbness Skin: no rash, no skin  tear. MSK: No muscle spasm, no deformity, no limitation of range of movement in spin Heme: No easy bruising.  Travel history: No recent long distant travel.   Allergy:  Allergies  Allergen Reactions   Banana Hives, Shortness Of Breath and Itching    Past Medical History:  Diagnosis Date   Anxiety    PAF (paroxysmal atrial fibrillation) (HCC)    Severe left ventricular hypertrophy    Stroke (cerebrum) (HCC)    Vertebral artery dissection (HCC)     Past Surgical History:  Procedure Laterality Date   ADENOIDECTOMY     IR ANGIO INTRA EXTRACRAN SEL COM CAROTID INNOMINATE BILAT MOD SED  09/15/2021   IR ANGIO VERTEBRAL SEL VERTEBRAL UNI L MOD SED  09/15/2021   IR ANGIOGRAM FOLLOW UP STUDY  09/15/2021   IR ANGIOGRAM FOLLOW UP STUDY  09/15/2021   IR ANGIOGRAM FOLLOW UP STUDY  09/15/2021   IR ANGIOGRAM FOLLOW UP STUDY  09/15/2021   IR ANGIOGRAM FOLLOW UP STUDY  09/15/2021   IR ANGIOGRAM FOLLOW UP STUDY  09/15/2021   IR ANGIOGRAM FOLLOW UP STUDY  09/15/2021   IR ANGIOGRAM FOLLOW UP STUDY  09/15/2021   IR ANGIOGRAM FOLLOW UP STUDY  09/15/2021   IR ANGIOGRAM FOLLOW UP STUDY  09/15/2021   IR ANGIOGRAM FOLLOW UP STUDY  09/15/2021   IR ANGIOGRAM FOLLOW UP STUDY  09/15/2021   IR CT HEAD LTD  09/15/2021   IR PERCUTANEOUS ART THROMBECTOMY/INFUSION INTRACRANIAL INC DIAG ANGIO  09/15/2021   IR TRANSCATH/EMBOLIZ  09/15/2021   RADIOLOGY WITH ANESTHESIA N/A  09/15/2021   Procedure: IR WITH ANESTHESIA;  Surgeon: Julieanne Cotton, MD;  Location: [facility] OR;  Service: Radiology;  Laterality: N/A;    Social History:  reports that he has been smoking cigarettes and cigars. He has never used smokeless tobacco. He reports current alcohol use. He reports that he does not use drugs.  Family History:  Family History  Problem Relation Age of Onset   Depression Mother    Cancer Paternal Uncle    Cancer Maternal Grandmother        breast    Hypertension Maternal Grandmother      Prior to Admission medications    Medication Sig Start Date End Date Taking? Authorizing Provider  clopidogrel (PLAVIX) 75 MG tablet Take by mouth. 12/10/22  Yes [provider]  apixaban (ELIQUIS) 5 MG TABS tablet Take 1 tablet (5 mg total) by mouth 2 (two) times daily. 05/27/22 06/26/22  Shon Baton, MD  aspirin EC 81 MG tablet Take 81 mg by mouth daily. Patient not taking: Reported on 01/13/2023 07/04/22 07/05/23  [provider]  atorvastatin (LIPITOR) 80 MG tablet Take 1 tablet (80 mg total) by mouth daily. Patient not taking: Reported on 01/13/2023 09/18/21   Rinehuls, Kinnie Scales, PA-C  DULoxetine (CYMBALTA) 30 MG capsule Take 30 mg by mouth 2 (two) times daily. Patient not taking: Reported on 01/13/2023 07/29/22   [provider]  metoprolol tartrate (LOPRESSOR) 25 MG tablet Take 1 tablet (25 mg total) by mouth 2 (two) times daily. Patient not taking: Reported on 01/13/2023 09/17/21   Rinehuls, David L, PA-C  ondansetron (ZOFRAN) 4 MG tablet Take 1 tablet (4 mg total) by mouth every 6 (six) hours. Patient not taking: Reported on 01/13/2023 02/23/22   Rondel Baton, MD    Physical Exam: Vitals:   01/13/23 1143 01/13/23 1146 01/13/23 1245 01/13/23 1555  BP: (!) 136/98  (!) 125/90 (!) 128/98  Pulse: 93  75 77  Resp: 19  16 15   Temp:   97.9 F (36.6 C) 98 F (36.7 C)  TempSrc:   Oral   SpO2: 100%  100% 100%  Weight:  100.7 kg    Height:  6\' 1"  (1.854 m)     General: Not in acute distress HEENT:       Eyes: PERRL, EOMI, no jaundice       ENT: No discharge from the ears and nose, no pharynx injection, no tonsillar enlargement.        Neck: No JVD, no bruit, no mass felt. Heme: No neck lymph node enlargement. Cardiac: S1/S2, RRR, No murmurs, No gallops or rubs. Respiratory: No rales, wheezing, rhonchi or rubs. GI: Soft, nondistended, nontender, no rebound pain, no organomegaly, BS present. GU: No hematuria Ext: No pitting leg edema bilaterally. 1+DP/PT pulse  bilaterally. Musculoskeletal: No joint deformities, No joint redness or warmth, no limitation of ROM in spin. Skin: No rashes.  Neuro: Alert, oriented X3, cranial nerves II-XII grossly intact, moves all extremities normally. Muscle strength 5/5 in all extremities, sensation to light touch intact.  Psych: Patient is not psychotic, no suicidal or hemocidal ideation.  Labs on Admission: I have personally reviewed following labs and imaging studies  CBC: Recent Labs  Lab 01/13/23 0855  WBC 7.3  NEUTROABS 3.4  HGB 15.2  HCT 46.7  MCV 83.5  PLT 200   Basic Metabolic Panel: Recent Labs  Lab 01/13/23 0835 01/13/23 0855  NA  --  135  K  --  3.2*  CL  --  103  CO2  --  25  GLUCOSE  --  114*  BUN  --  13  CREATININE  --  0.92  CALCIUM  --  8.6*  MG 2.1  --    GFR: Estimated Creatinine Clearance: 145.1 mL/min (by C-G formula based on SCr of 0.92 mg/dL). Liver Function Tests: Recent Labs  Lab 01/13/23 0855  AST 25  ALT 39  ALKPHOS 67  BILITOT 0.8  PROT 7.5  ALBUMIN 4.1   No results for input(s): "LIPASE", "AMYLASE" in the last 168 hours. No results for input(s): "AMMONIA" in the last 168 hours. Coagulation Profile: Recent Labs  Lab 01/13/23 0855  INR 1.0   Cardiac Enzymes: No results for input(s): "CKTOTAL", "CKMB", "CKMBINDEX", "TROPONINI" in the last 168 hours. BNP (last 3 results) No results for input(s): "PROBNP" in the last 8760 hours. HbA1C: Recent Labs    01/13/23 0855  HGBA1C 5.4   CBG: Recent Labs  Lab 01/13/23 0827  GLUCAP 112*   Lipid Profile: No results for input(s): "CHOL", "HDL", "LDLCALC", "TRIG", "CHOLHDL", "LDLDIRECT" in the last 72 hours. Thyroid Function Tests: No results for input(s): "TSH", "T4TOTAL", "FREET4", "T3FREE", "THYROIDAB" in the last 72 hours. Anemia Panel: No results for input(s): "VITAMINB12", "FOLATE", "FERRITIN", "TIBC", "IRON", "RETICCTPCT" in the last 72 hours. Urine analysis:    Component Value Date/Time    COLORURINE COLORLESS (A) 01/01/2022 1423   APPEARANCEUR CLEAR 01/01/2022 1423   LABSPEC <1.005 (L) 01/01/2022 1423   PHURINE 7.0 01/01/2022 1423   GLUCOSEU NEGATIVE 01/01/2022 1423   HGBUR NEGATIVE 01/01/2022 1423   BILIRUBINUR NEGATIVE 01/01/2022 1423   KETONESUR NEGATIVE 01/01/2022 1423   PROTEINUR NEGATIVE 01/01/2022 1423   UROBILINOGEN 0.2 03/04/2015 1441   NITRITE NEGATIVE 01/01/2022 1423   LEUKOCYTESUR NEGATIVE 01/01/2022 1423   Sepsis Labs: @LABRCNTIP (procalcitonin:4,lacticidven:4) )No results found for this or any previous visit (from the past 240 hour(s)).   Radiological Exams on Admission: ECHOCARDIOGRAM COMPLETE BUBBLE STUDY  Result Date: 01/13/2023    ECHOCARDIOGRAM REPORT   Patient Name:   DORNELL GRASMICK Date of Exam: 01/13/2023 Medical Rec #:  [id number]       Height:       73.0 in Accession #:    [id number]      Weight:       222.0 lb Date of Birth:  1990/11/29        BSA:          2.249 m Patient Age:    32 years        BP:           125/90 mmHg Patient Gender: M               HR:           78 bpm. Exam Location:  [facility] Procedure: 2D Echo, Cardiac Doppler, Color Doppler and Saline Contrast Bubble            Study Indications:     Stroke  History:         Patient has prior history of Echocardiogram examinations, most                  recent 09/16/2021. Stroke, Arrythmias:Atrial Fibrillation; Risk                  Factors:Dyslipidemia and Current Smoker.  Sonographer:     Mikki Harbor Referring Phys:  [id number] Brien Few Katelan Hirt Diagnosing Phys: Lorine Bears MD IMPRESSIONS  1. Left ventricular ejection fraction, by estimation,  is 55 to 60%. The left ventricle has normal function. The left ventricle has no regional wall motion abnormalities. The left ventricular internal cavity size was mildly dilated. There is mild asymmetric left ventricular hypertrophy of the septal segment. Left ventricular diastolic parameters were normal.  2. Right ventricular systolic function is normal. The right  ventricular size is normal. Tricuspid regurgitation signal is inadequate for assessing PA pressure.  3. The mitral valve is normal in structure. No evidence of mitral valve regurgitation. No evidence of mitral stenosis.  4. The aortic valve is normal in structure. Aortic valve regurgitation is not visualized. No aortic stenosis is present.  5. The inferior vena cava is normal in size with greater than 50% respiratory variability, suggesting right atrial pressure of 3 mmHg.  6. Agitated saline contrast bubble study was negative, with no evidence of any interatrial shunt. FINDINGS  Left Ventricle: Left ventricular ejection fraction, by estimation, is 55 to 60%. The left ventricle has normal function. The left ventricle has no regional wall motion abnormalities. The left ventricular internal cavity size was mildly dilated. There is  mild asymmetric left ventricular hypertrophy of the septal segment. Left ventricular diastolic parameters were normal. Right Ventricle: The right ventricular size is normal. No increase in right ventricular wall thickness. Right ventricular systolic function is normal. Tricuspid regurgitation signal is inadequate for assessing PA pressure. Left Atrium: Left atrial size was normal in size. Right Atrium: Right atrial size was normal in size. Pericardium: There is no evidence of pericardial effusion. Mitral Valve: The mitral valve is normal in structure. No evidence of mitral valve regurgitation. No evidence of mitral valve stenosis. MV peak gradient, 3.4 mmHg. The mean mitral valve gradient is 1.0 mmHg. Tricuspid Valve: The tricuspid valve is normal in structure. Tricuspid valve regurgitation is not demonstrated. No evidence of tricuspid stenosis. Aortic Valve: The aortic valve is normal in structure. Aortic valve regurgitation is not visualized. No aortic stenosis is present. Aortic valve mean gradient measures 2.0 mmHg. Aortic valve peak gradient measures 3.9 mmHg. Aortic valve area, by VTI  measures 3.45 cm. Pulmonic Valve: The pulmonic valve was normal in structure. Pulmonic valve regurgitation is not visualized. No evidence of pulmonic stenosis. Aorta: The aortic root is normal in size and structure. Venous: The inferior vena cava is normal in size with greater than 50% respiratory variability, suggesting right atrial pressure of 3 mmHg. IAS/Shunts: No atrial level shunt detected by color flow Doppler. Agitated saline contrast was given intravenously to evaluate for intracardiac shunting. Agitated saline contrast bubble study was negative, with no evidence of any interatrial shunt.  LEFT VENTRICLE PLAX 2D LVIDd:         5.80 cm   Diastology LVIDs:         4.10 cm   LV e' medial:    8.05 cm/s LV PW:         0.80 cm   LV E/e' medial:  6.5 LV IVS:        1.30 cm   LV e' lateral:   11.20 cm/s LVOT diam:     2.20 cm   LV E/e' lateral: 4.7 LV SV:         74 LV SV Index:   33 LVOT Area:     3.80 cm  RIGHT VENTRICLE RV Basal diam:  4.30 cm RV Mid diam:    3.70 cm RV S prime:     10.60 cm/s TAPSE (M-mode): 2.2 cm LEFT ATRIUM  Index        RIGHT ATRIUM           Index LA diam:        3.80 cm 1.69 cm/m   RA Area:     19.70 cm LA Vol (A2C):   35.7 ml 15.87 ml/m  RA Volume:   60.30 ml  26.81 ml/m LA Vol (A4C):   39.4 ml 17.52 ml/m LA Biplane Vol: 41.7 ml 18.54 ml/m  AORTIC VALVE                    PULMONIC VALVE AV Area (Vmax):    3.47 cm     PV Vmax:       0.90 m/s AV Area (Vmean):   3.22 cm     PV Peak grad:  3.2 mmHg AV Area (VTI):     3.45 cm AV Vmax:           98.70 cm/s AV Vmean:          69.200 cm/s AV VTI:            0.214 m AV Peak Grad:      3.9 mmHg AV Mean Grad:      2.0 mmHg LVOT Vmax:         90.20 cm/s LVOT Vmean:        58.700 cm/s LVOT VTI:          0.194 m LVOT/AV VTI ratio: 0.91  AORTA Ao Root diam: 3.90 cm Ao Asc diam:  3.30 cm MITRAL VALVE MV Area (PHT): 3.27 cm    SHUNTS MV Area VTI:   3.60 cm    Systemic VTI:  0.19 m MV Peak grad:  3.4 mmHg    Systemic Diam: 2.20 cm  MV Mean grad:  1.0 mmHg MV Vmax:       0.92 m/s MV Vmean:      51.3 cm/s MV Decel Time: 232 msec MV E velocity: 52.20 cm/s MV A velocity: 54.80 cm/s MV E/A ratio:  0.95 Lorine Bears MD Electronically signed by Lorine Bears MD Signature Date/Time: 01/13/2023/3:43:52 PM    Final    MR BRAIN WO CONTRAST  Result Date: 01/13/2023 CLINICAL DATA:  Provided history: Neuro deficit, acute, stroke suspected. Right-sided face and arm numbness. EXAM: MRI HEAD WITHOUT CONTRAST TECHNIQUE: Multiplanar, multiecho pulse sequences of the brain and surrounding structures were obtained without intravenous contrast. COMPARISON:  Non-contrast head CT and CT angiogram head/neck 01/13/2023. Brain MRI 09/16/2021. FINDINGS: Brain: Cerebral volume is normal. Known chronic infarcts within the right aspect of the medulla and within the right cerebellar hemisphere, some of which were better appreciated on the prior brain MRI of 09/16/2021 (subacute at that time). There is no acute infarct. No evidence of an intracranial mass. No chronic intracranial blood products. No extra-axial fluid collection. No midline shift. Vascular: Prior endovascular occlusion of the right vertebral artery. Flow voids preserved elsewhere within the proximal large arterial vessels. Skull and upper cervical spine: No focal suspicious marrow lesion. Sinuses/Orbits: No mass or acute finding within the imaged orbits. Small mucous retention cysts within the left maxillary sinus. Other: Non-expansile asymmetric T2 hyperintense signal within the right petrous apex, likely reflecting trapped fluid. IMPRESSION: 1. No evidence of an acute intracranial abnormality. 2. Known chronic infarcts within the right aspect of the medulla and right cerebellar hemisphere. 3. Small mucous retention cyst within the left maxillary sinus. 4. T2 hyperintense signal within the right petrous apex, likely reflecting trapped fluid.  Electronically Signed   By: Jackey Loge D.O.   On: 01/13/2023  14:34   CT ANGIO HEAD NECK W WO CM  Result Date: 01/13/2023 CLINICAL DATA:  Right arm numbness for 2 days. Lightheadedness and dizziness. History of right vertebral artery dissection. EXAM: CT ANGIOGRAPHY HEAD AND NECK WITH AND WITHOUT CONTRAST TECHNIQUE: Multidetector CT imaging of the head and neck was performed using the standard protocol during bolus administration of intravenous contrast. Multiplanar CT image reconstructions and MIPs were obtained to evaluate the vascular anatomy. Carotid stenosis measurements (when applicable) are obtained utilizing NASCET criteria, using the distal internal carotid diameter as the denominator. RADIATION DOSE REDUCTION: This exam was performed according to the departmental dose-optimization program which includes automated exposure control, adjustment of the mA and/or kV according to patient size and/or use of iterative reconstruction technique. CONTRAST:  75mL OMNIPAQUE IOHEXOL 350 MG/ML SOLN COMPARISON:  CTA head and neck 09/15/2021 FINDINGS: The study is mildly motion degraded. CTA NECK FINDINGS Aortic arch: Normal variant aortic arch branching pattern with common origin of the brachiocephalic and left common carotid arteries. Widely patent arch vessel origins. Right carotid system: Patent without evidence of stenosis or dissection. Left carotid system: Patent without evidence of stenosis or dissection. Vertebral arteries: The left vertebral artery is patent without evidence a significant stenosis or dissection within limitations of motion artifact. Status post endovascular occlusion of the right vertebral artery with multiple coils throughout the V3 segment. Skeleton: No acute osseous abnormality or suspicious osseous lesion. Other neck: No evidence of cervical lymphadenopathy or mass. Upper chest: Clear lung apices. Review of the MIP images confirms the above findings CTA HEAD FINDINGS Anterior circulation: The internal carotid arteries are widely patent from skull  base to carotid termini. ACAs and MCAs are patent without evidence of a flow limiting A1 or M1 stenosis. Diffusely irregular appearance of the small and medium-sized vessels is felt to be technical. No aneurysm is identified. Posterior circulation: The intracranial left vertebral artery is patent and supplies the basilar. Minimal opacification of the right V4 segment may be retrograde. The basilar artery is patent without evidence of a significant stenosis. There are robust posterior communicating arteries bilaterally with aplastic right and hypoplastic left P1 segments. Both PCAs are patent without evidence of a flow limiting proximal stenosis. No aneurysm is identified. Venous sinuses: Patent. Anatomic variants: Fetal origin of the PCAs. Hypoplastic right A1 segment. Review of the MIP images confirms the above findings These results were communicated to Dr. Selina Cooley at 9:14 am on 01/13/2023 by text page via the [facility] messaging system. IMPRESSION: 1. No emergent large vessel occlusion or flow limiting proximal stenosis in the head or neck. 2. Status post endovascular occlusion of the right vertebral artery. Electronically Signed   By: Sebastian Ache M.D.   On: 01/13/2023 09:25   CT HEAD CODE STROKE WO CONTRAST  Result Date: 01/13/2023 CLINICAL DATA:  Code stroke. Neuro deficit, acute, stroke suspected. Right arm numbness over the last 2 days. EXAM: CT HEAD WITHOUT CONTRAST TECHNIQUE: Contiguous axial images were obtained from the base of the skull through the vertex without intravenous contrast. RADIATION DOSE REDUCTION: This exam was performed according to the departmental dose-optimization program which includes automated exposure control, adjustment of the mA and/or kV according to patient size and/or use of iterative reconstruction technique. COMPARISON:  MRI 09/16/2021. FINDINGS: Brain: Previous strokes in the right medulla and cerebellum cannot be appreciated by CT. Cerebral hemispheres appear normal without  evidence of old or acute insult. No  mass, hemorrhage, hydrocephalus or extra-axial collection. Vascular: No abnormal vascular finding. Skull: Normal Sinuses/Orbits: Clear/normal Other: None ASPECTS ([place] Stroke Program Early CT Score) - Ganglionic level infarction (caudate, lentiform nuclei, internal capsule, insula, M1-M3 cortex): 7 - Supraganglionic infarction (M4-M6 cortex): 3 Total score (0-10 with 10 being normal): 10 IMPRESSION: 1. No acute finding by CT. Previous strokes in the right medulla and cerebellum cannot be appreciated by CT. 2. Aspects is 10. These results were communicated to Dr. Selina Cooley at 8:39 am on 01/13/2023 by text page via the [facility] messaging system. Electronically Signed   By: Paulina Fusi M.D.   On: 01/13/2023 08:41      Assessment/Plan Principal Problem:   Stroke [facility]) Active Problems:   Vertebral artery dissection (HCC)   PAF (paroxysmal atrial fibrillation) (HCC)   HLD (hyperlipidemia)   Tobacco user   Depression with anxiety   Hypokalemia   Assessment and Plan:   Stroke [facility]): Patient's symptoms is concerning for new stroke.  CT head negative for acute intracranial abnormalities.  CTA negative for LVO.  Dr. Selina Cooley however renal was consulted.  -Placed on tele bed for observation - Obtain MRI-brain  - ASA plus Plavix per Dr. Selina Cooley - will hold oral Bp meds to allow permissive HTN - Statin: lipitor 80 mg daily - fasting lipid panel and HbA1c  - 2D transthoracic echocardiography with buttle - swallowing screen. If fails, will get SLP - Check UDS  - PT/OT consult  Vertebral artery dissection (HCC) -Hold Eliquis pending MRI  PAF (paroxysmal atrial fibrillation) (HCC): Heart rate 104 --> 84 -Hold Eliqus pending MRI -As needed IV metoprolol for heart rate> 125  HLD (hyperlipidemia) -Lipitor  Tobacco user -Did counseling about importance of quitting smoking -Nicotine patch  Depression with anxiety: Patient's not taking Cymbalta currently.  No suicidal  or homicidal ideation. -Observe closely  Hypokalemia: Potassium 3.2.  Magnesium 2.1 -Repleted potassium    DVT ppx: SQ Lovenox  Code Status: Full code    Family Communication: not done, no family member is at bed side.    Disposition Plan:  Anticipate discharge back to previous environment  Consults called:  Dr. Selina Cooley of neuro  Admission status and Level of care: Telemetry Medical:   for obs   Dispo: The patient is from: Home              Anticipated d/c is to: Home              Anticipated d/c date is: 1 day              Patient currently is not medically stable to d/c.    Severity of Illness:  The appropriate patient status for this patient is OBSERVATION. Observation status is judged to be reasonable and necessary in order to provide the required intensity of service to ensure the patient's safety. The patient's presenting symptoms, physical exam findings, and initial radiographic and laboratory data in the context of their medical condition is felt to place them at decreased risk for further clinical deterioration. Furthermore, it is anticipated that the patient will be medically stable for discharge from the hospital within 2 midnights of admission.        Date of Service 01/13/2023    Lorretta Harp [facility]   If 7PM-7AM, please contact night-coverage [email or web address] 01/13/2023, 7:25 PM

## 2023-01-13 NOTE — ED Notes (Signed)
CODE  STROKE  CALLED  TO  CAREL[facility]  

## 2023-01-13 NOTE — ED Provider Notes (Signed)
[facility] Provider Note    Event Date/Time   First MD Initiated Contact with Patient 01/13/23 [phone]     (approximate)   History   Numbness   HPI  David Shields is a 32 y.o. 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  Had had intermittent symptoms, started again around 10 PM last night, resolved prior to going to bed.  Patient activated as a code stroke from triage.  On reevaluation after completion of his imaging, he does report some ongoing paresthesias over his right face.  He additionally reports dizziness that he describes as lightheadedness.  Says this does not feel similar to when he previously had a stroke.  Denies any residual deficits.    Physical Exam   Triage Vital Signs: ED Triage Vitals [01/13/23 0822]  Encounter Vitals Group     BP (!) 154/107     Systolic BP Percentile      Diastolic BP Percentile      Pulse Rate (!) 104     Resp 16     Temp 98.4 F (36.9 C)     Temp Source Oral     SpO2 97 %     Weight      Height      Head Circumference      Peak Flow      Pain Score      Pain Loc      Pain Education      Exclude from Growth Chart     Most recent vital signs: Vitals:   01/13/23 1245 01/13/23 1555  BP: (!) 125/90 (!) 128/98  Pulse: 75 77  Resp: 16 15  Temp: 97.9 F (36.6 C) 98 F (36.7 C)  SpO2: 100% 100%     General: Awake, interactive  CV:  Regular rate, good peripheral perfusion.  Resp:  Lungs clear, unlabored respirations.  Abd:  Soft, nondistended.  Neuro:  Keenly aware, correctly answers month and age, able to blink eyes and squeeze hands, normal horizontal extraocular movements, no visual field loss, normal facial symmetry, no arm or leg motor drift, no limb ataxia, reports diminished sensation over the right side of his face, but intact to  light touch, no aphasia, no dysarthria, no inattention    ED Results / Procedures / Treatments   Labs (all labs ordered are listed, but only abnormal results are displayed) Labs Reviewed  COMPREHENSIVE METABOLIC PANEL - Abnormal; Notable for the following components:      Result Value   Potassium 3.2 (*)    Glucose, Bld 114 (*)    Calcium 8.6 (*)    All other components within normal limits  CBG MONITORING, ED - Abnormal; Notable for the following components:   Glucose-Capillary 112 (*)    All other components within normal limits  PROTIME-INR  APTT  CBC  DIFFERENTIAL  ETHANOL  URINE DRUG SCREEN, QUALITATIVE ([facility] ONLY)  HEMOGLOBIN A1C  MAGNESIUM  HIV ANTIBODY (ROUTINE TESTING W REFLEX)  I-STAT CREATININE, ED     EKG EKG independently reviewed interpreted by myself (ER attending) demonstrates:  EKG demonstrates sinus rhythm at a rate of 87, PR 155, QRS 101, QTc 454, no acute ST changes  RADIOLOGY Imaging independently reviewed and interpreted by myself demonstrates:  CT head without acute bleed CTA head and neck without acute occlusion  PROCEDURES:  Critical Care performed:  No  Procedures   MEDICATIONS ORDERED IN ED: Medications  hydrALAZINE (APRESOLINE) injection 5 mg (has no administration in time range)  ondansetron (ZOFRAN) injection 4 mg (has no administration in time range)  nicotine (NICODERM CQ - dosed in mg/24 hours) patch 21 mg (21 mg Transdermal Not Given 01/13/23 1127)  aspirin EC tablet 81 mg (has no administration in time range)  atorvastatin (LIPITOR) tablet 80 mg (80 mg Oral Given 01/13/23 1131)   stroke: early stages of recovery book (has no administration in time range)  acetaminophen (TYLENOL) tablet 650 mg (has no administration in time range)    Or  acetaminophen (TYLENOL) 160 MG/5ML solution 650 mg (has no administration in time range)    Or  acetaminophen (TYLENOL) suppository 650 mg (has no administration in time range)  senna-docusate  (Senokot-S) tablet 1 tablet (has no administration in time range)  enoxaparin (LOVENOX) injection 40 mg (40 mg Subcutaneous Not Given 01/13/23 1136)  sodium chloride flush (NS) 0.9 % injection 3 mL (3 mLs Intravenous Given 01/13/23 0858)  iohexol (OMNIPAQUE) 350 MG/ML injection 75 mL (75 mLs Intravenous Contrast Given 01/13/23 0845)  alum & mag hydroxide-simeth (MAALOX/MYLANTA) 200-200-20 MG/5ML suspension 30 mL (30 mLs Oral Given 01/13/23 1041)  potassium chloride SA (KLOR-CON M) CR tablet 40 mEq (40 mEq Oral Given 01/13/23 1130)  aspirin EC tablet 325 mg (325 mg Oral Given 01/13/23 1130)     IMPRESSION / MDM / ASSESSMENT AND PLAN / ED COURSE  I reviewed the triage vital signs and the nursing notes.  Differential diagnosis includes, but is not limited to, acute CVA, complication from prior dissection repair, new vertebral artery dissection, recrudescence of prior stroke, anemia, electrolyte abnormality  Patient's presentation is most consistent with acute presentation with potential threat to life or bodily function.  32 year old male presenting with right-sided paresthesias with history of prior cerebellar stroke.  Code stroke imaging without acute findings.  Case reviewed with Dr. Selina Cooley.  Patient presented outside of thrombolytic window so TNK was not administered.  She does recommend admission for stroke workup.  Will reach out to hospitalist team.  Case discussed with Dr. Clyde Lundborg.  He will evaluate the patient for anticipated admission.     FINAL CLINICAL IMPRESSION(S) / ED DIAGNOSES   Final diagnoses:  Paresthesia of right arm  Facial paresthesia     Rx / DC Orders   ED Discharge Orders     None        Note:  This document was prepared using Dragon voice recognition software and may include unintentional dictation errors.   Trinna Post, MD 01/13/23 [phone]

## 2023-01-14 ENCOUNTER — Other Ambulatory Visit (HOSPITAL_COMMUNITY): Payer: Self-pay

## 2023-01-14 DIAGNOSIS — E785 Hyperlipidemia, unspecified: Secondary | ICD-10-CM | POA: Diagnosis not present

## 2023-01-14 DIAGNOSIS — I480 Paroxysmal atrial fibrillation: Secondary | ICD-10-CM

## 2023-01-14 DIAGNOSIS — G459 Transient cerebral ischemic attack, unspecified: Secondary | ICD-10-CM

## 2023-01-14 DIAGNOSIS — I7774 Dissection of vertebral artery: Secondary | ICD-10-CM

## 2023-01-14 LAB — LIPID PANEL
Cholesterol: 225 mg/dL — ABNORMAL HIGH (ref 0–200)
HDL: 42 mg/dL (ref >40)
LDL Cholesterol: 155 mg/dL — ABNORMAL HIGH (ref 0–99)
Total CHOL/HDL Ratio: 5.4 RATIO
Triglycerides: 140 mg/dL (ref <150)
VLDL: 28 mg/dL (ref 0–40)

## 2023-01-14 MED ORDER — APIXABAN 5 MG PO TABS: 5.0000 mg | ORAL_TABLET | Freq: Two times a day (BID) | ORAL | 3 refills | Status: AC

## 2023-01-14 MED ORDER — ATORVASTATIN CALCIUM 80 MG PO TABS: 80.0000 mg | ORAL_TABLET | Freq: Every day | ORAL | 3 refills | Status: DC

## 2023-01-14 MED ORDER — NICOTINE 21 MG/24HR TD PT24: 21.0000 mg | MEDICATED_PATCH | Freq: Every day | TRANSDERMAL | 0 refills | Status: DC

## 2023-01-14 MED ORDER — APIXABAN 5 MG PO TABS: 5.0000 mg | ORAL_TABLET | Freq: Two times a day (BID) | ORAL | 0 refills | Status: DC

## 2023-01-14 MED ORDER — APIXABAN 5 MG PO TABS
5.0000 mg | ORAL_TABLET | Freq: Two times a day (BID) | ORAL | Status: DC
  Administered 2023-01-14: 5 mg via ORAL
  Filled 2023-01-14: qty 1

## 2023-01-14 MED ORDER — APIXABAN 5 MG PO TABS: 5.0000 mg | ORAL_TABLET | Freq: Two times a day (BID) | ORAL | 3 refills | Status: DC

## 2023-01-14 NOTE — Discharge Instructions (Signed)
Pt advised to f/u PCP and get referral to see cardiology at [facility] (afib) and also out pt PT/OT at [facility]

## 2023-01-14 NOTE — Progress Notes (Addendum)
Physical Therapy Treatment Patient Details Name: David Shields MRN: [id number] DOB: 26-Sep-1990 Today's Date: 01/14/2023   History of Present Illness Eann Cleland is a 32 year old man with past medical history significant for A-fib, severe LVH, 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  Last known well 10 PM before he went to bed and he woke up with symptoms at 530.  He has had fluctuating right arm and face numbness for the past 2 days but it had resolved prior to going to bed.    PT Comments  Pt seated EOB, denies any pain or dizziness at baseline. Pt performed bed mobility, transfers c/ supervision. PT performed Dix-Hallpike test, dizziness elicited L>R, subsides <19min, no nystagmus noted. PT performed Epley to treat L side, pt reported 8/10 dizziness at the final position, described as "room was spinning", subsided <30sec. Pt reported 2/10 headache which subsided <38min, this is pre-existing from previous CVA. Pt symptoms and test results not concordant with BPPV, PT recommends further vestibular testing to address pt concerns and symptoms. Pt would benefit from acute therapy services to maximize independence, quality of life, and improve activity tolerance.     Assistance Recommended at Discharge PRN  If plan is discharge home, recommend the following:  Can travel by private vehicle    A little help with walking and/or transfers;A little help with bathing/dressing/bathroom;Assist for transportation      Equipment Recommendations  None recommended by PT    Recommendations for Other Services       Precautions / Restrictions Precautions Precautions: Fall Restrictions Weight Bearing Restrictions: No     Mobility  Bed Mobility Overal bed mobility: Needs Assistance Bed Mobility: Supine to Sit, Sit to Supine, Rolling Rolling: Supervision   Supine to sit: Supervision Sit to supine:  Supervision        Transfers Overall transfer level: Needs assistance Equipment used: None Transfers: Sit to/from Stand Sit to Stand: Supervision                Ambulation/Gait                   Stairs             Wheelchair Mobility     Tilt Bed    Modified Rankin (Stroke Patients Only)       Balance Overall balance assessment: Needs assistance Sitting-balance support: Feet supported, No upper extremity supported Sitting balance-Leahy Scale: Normal     Standing balance support: No upper extremity supported Standing balance-Leahy Scale: Good                              Cognition Arousal/Alertness: Awake/alert Behavior During Therapy: WFL for tasks assessed/performed, Anxious Overall Cognitive Status: Within Functional Limits for tasks assessed                                          Exercises Other Exercises Other Exercises: PT performed Dix-Hallpike test, dizziness L>R, subsides <24min, no noted nystagmus. 8/10 dizziness on L, 1/10 dizziness on R. Other Exercises: PT performed Epley manuever, pt noted 9/10 dizziness at final step of maneuver sitting EOB.    General Comments        Pertinent Vitals/Pain Pain Assessment Pain Assessment: No/denies pain    Home Living Family/patient  expects to be discharged to:: Private residence                        Prior Function            PT Goals (current goals can now be found in the care plan section) Progress towards PT goals: Progressing toward goals    Frequency    Min 1X/week      PT Plan Current plan remains appropriate    Co-evaluation              AM-PAC PT "6 Clicks" Mobility   Outcome Measure  Help needed turning from your back to your side while in a flat bed without using bedrails?: None Help needed moving from lying on your back to sitting on the side of a flat bed without using bedrails?: None Help needed moving to  and from a bed to a chair (including a wheelchair)?: None Help needed standing up from a chair using your arms (e.g., wheelchair or bedside chair)?: None Help needed to walk in hospital room?: None Help needed climbing 3-5 steps with a railing? : None 6 Click Score: 24    End of Session   Activity Tolerance: Patient tolerated treatment well Patient left: in bed;with call bell/phone within reach;with nursing/sitter in room Nurse Communication: Mobility status PT Visit Diagnosis: Other abnormalities of gait and mobility (R26.89);Unsteadiness on feet (R26.81);Other symptoms and signs involving the nervous system (R29.898)     Time: 9147-8295 PT Time Calculation (min) (ACUTE ONLY): 17 min  Charges:    $Canalith Rep Proc: 8-22 mins PT General Charges $$ ACUTE PT VISIT: 1 Visit                     Lala Lund, PT, SPT  3:11 PM,01/14/23

## 2023-01-14 NOTE — Discharge Summary (Signed)
Physician Discharge Summary   Patient: David Shields MRN: [id number] DOB: January 11, 1991  Admit date:     01/13/2023  Discharge date: 01/14/23  Discharge Physician: Enedina Finner   PCP: Vertell Limber, PA-C   Recommendations at discharge:    F/u PCP at [facility] --defer PCP to do cardiology referral at the [facility]  Discharge Diagnoses: Principal Problem:   Stroke [facility]) Active Problems:   Vertebral artery dissection (HCC)   PAF (paroxysmal atrial fibrillation) (HCC)   HLD (hyperlipidemia)   Tobacco user   Depression with anxiety   Hypokalemia   David Shields is a 32 y.o. 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   CT of head is negative for acute intracranial abnormalities, but showed old stroke.  CTA negative for LVO.  MRI brain negative of CVA  Suspected TIA Patient's symptoms is concerning for new stroke.  CT head negative for acute intracranial abnormalities.  CTA negative for LVO.  -- Dr. Selina Cooley neurology consulted  - will hold oral Bp meds to allow permissive HTN - Statin: lipitor 80 mg daily - fasting lipid panel and HbA1c  - 2D transthoracic echocardiography with bubble study ok - UDS negative - PT/OT consult[facility]--defer to PCP for referral   Vertebral artery dissection (HCC) -resumed eliquis per Neurology recs   PAF (paroxysmal atrial fibrillation) (HCC): Heart rate 104 --> 84 -resumed eliquis --pt advised to get outpatient referral for cardiology to follow  HLD (hyperlipidemia) -Lipitor   Tobacco user -Did counseling about importance of quitting smoking -Nicotine patch   Depression with anxiety: Patient's not taking Cymbalta currently.  No suicidal or homicidal ideation.   Hypokalemia: Potassium 3.2.  Magnesium  2.1 -Repleted potassium     discharge plan was discussed with patient and his wife on the phone       Consultants: neurology Procedures performed: none Disposition: Home Diet recommendation:  Discharge Diet Orders (From admission, onward)     Start     Ordered   01/14/23 0000  Diet - low sodium heart healthy        01/14/23 1207           Cardiac diet DISCHARGE MEDICATION: Allergies as of 01/14/2023       Reactions   Banana Hives, Shortness Of Breath, Itching        Medication List     STOP taking these medications    aspirin EC 81 MG tablet   clopidogrel 75 MG tablet Commonly known as: PLAVIX   DULoxetine 30 MG capsule Commonly known as: CYMBALTA   metoprolol tartrate 25 MG tablet Commonly known as: LOPRESSOR   ondansetron 4 MG tablet Commonly known as: ZOFRAN       TAKE these medications    apixaban 5 MG Tabs tablet Commonly known as: ELIQUIS Take 1 tablet (5 mg total) by mouth 2 (two) times daily.   atorvastatin 80 MG tablet Commonly known as: LIPITOR Take 1 tablet (80 mg total) by mouth daily.   nicotine 21 mg/24hr patch Commonly known as: NICODERM CQ - dosed in mg/24 hours Place 1 patch (21 mg total) onto the skin daily. Start taking on: January 15, 2023        Follow-up Information     Nada Boozer A, [place]. Schedule an appointment as soon as possible for  a visit in 1 week(s).   Specialty: Physician Assistant                Filed Weights   01/13/23 1146  Weight: 100.7 kg     Condition at discharge: fair  The results of significant diagnostics from this hospitalization (including imaging, microbiology, ancillary and laboratory) are listed below for reference.   Imaging Studies: ECHOCARDIOGRAM COMPLETE BUBBLE STUDY  Result Date: 01/13/2023    ECHOCARDIOGRAM REPORT   Patient Name:   David Shields Date of Exam: 01/13/2023 Medical Rec #:  [id number]       Height:       73.0 in Accession #:    [id number]      Weight:        222.0 lb Date of Birth:  1990/09/09        BSA:          2.249 m Patient Age:    32 years        BP:           125/90 mmHg Patient Gender: M               HR:           78 bpm. Exam Location:  [facility] Procedure: 2D Echo, Cardiac Doppler, Color Doppler and Saline Contrast Bubble            Study Indications:     Stroke  History:         Patient has prior history of Echocardiogram examinations, most                  recent 09/16/2021. Stroke, Arrythmias:Atrial Fibrillation; Risk                  Factors:Dyslipidemia and Current Smoker.  Sonographer:     Mikki Harbor Referring Phys:  [id number] Brien Few NIU Diagnosing Phys: Lorine Bears MD IMPRESSIONS  1. Left ventricular ejection fraction, by estimation, is 55 to 60%. The left ventricle has normal function. The left ventricle has no regional wall motion abnormalities. The left ventricular internal cavity size was mildly dilated. There is mild asymmetric left ventricular hypertrophy of the septal segment. Left ventricular diastolic parameters were normal.  2. Right ventricular systolic function is normal. The right ventricular size is normal. Tricuspid regurgitation signal is inadequate for assessing PA pressure.  3. The mitral valve is normal in structure. No evidence of mitral valve regurgitation. No evidence of mitral stenosis.  4. The aortic valve is normal in structure. Aortic valve regurgitation is not visualized. No aortic stenosis is present.  5. The inferior vena cava is normal in size with greater than 50% respiratory variability, suggesting right atrial pressure of 3 mmHg.  6. Agitated saline contrast bubble study was negative, with no evidence of any interatrial shunt. FINDINGS  Left Ventricle: Left ventricular ejection fraction, by estimation, is 55 to 60%. The left ventricle has normal function. The left ventricle has no regional wall motion abnormalities. The left ventricular internal cavity size was mildly dilated. There is  mild asymmetric left  ventricular hypertrophy of the septal segment. Left ventricular diastolic parameters were normal. Right Ventricle: The right ventricular size is normal. No increase in right ventricular wall thickness. Right ventricular systolic function is normal. Tricuspid regurgitation signal is inadequate for assessing PA pressure. Left Atrium: Left atrial size was normal in size. Right Atrium: Right atrial size was normal in size. Pericardium: There is no evidence of pericardial effusion. Mitral Valve:  The mitral valve is normal in structure. No evidence of mitral valve regurgitation. No evidence of mitral valve stenosis. MV peak gradient, 3.4 mmHg. The mean mitral valve gradient is 1.0 mmHg. Tricuspid Valve: The tricuspid valve is normal in structure. Tricuspid valve regurgitation is not demonstrated. No evidence of tricuspid stenosis. Aortic Valve: The aortic valve is normal in structure. Aortic valve regurgitation is not visualized. No aortic stenosis is present. Aortic valve mean gradient measures 2.0 mmHg. Aortic valve peak gradient measures 3.9 mmHg. Aortic valve area, by VTI measures 3.45 cm. Pulmonic Valve: The pulmonic valve was normal in structure. Pulmonic valve regurgitation is not visualized. No evidence of pulmonic stenosis. Aorta: The aortic root is normal in size and structure. Venous: The inferior vena cava is normal in size with greater than 50% respiratory variability, suggesting right atrial pressure of 3 mmHg. IAS/Shunts: No atrial level shunt detected by color flow Doppler. Agitated saline contrast was given intravenously to evaluate for intracardiac shunting. Agitated saline contrast bubble study was negative, with no evidence of any interatrial shunt.  LEFT VENTRICLE PLAX 2D LVIDd:         5.80 cm   Diastology LVIDs:         4.10 cm   LV e' medial:    8.05 cm/s LV PW:         0.80 cm   LV E/e' medial:  6.5 LV IVS:        1.30 cm   LV e' lateral:   11.20 cm/s LVOT diam:     2.20 cm   LV E/e' lateral: 4.7  LV SV:         74 LV SV Index:   33 LVOT Area:     3.80 cm  RIGHT VENTRICLE RV Basal diam:  4.30 cm RV Mid diam:    3.70 cm RV S prime:     10.60 cm/s TAPSE (M-mode): 2.2 cm LEFT ATRIUM             Index        RIGHT ATRIUM           Index LA diam:        3.80 cm 1.69 cm/m   RA Area:     19.70 cm LA Vol (A2C):   35.7 ml 15.87 ml/m  RA Volume:   60.30 ml  26.81 ml/m LA Vol (A4C):   39.4 ml 17.52 ml/m LA Biplane Vol: 41.7 ml 18.54 ml/m  AORTIC VALVE                    PULMONIC VALVE AV Area (Vmax):    3.47 cm     PV Vmax:       0.90 m/s AV Area (Vmean):   3.22 cm     PV Peak grad:  3.2 mmHg AV Area (VTI):     3.45 cm AV Vmax:           98.70 cm/s AV Vmean:          69.200 cm/s AV VTI:            0.214 m AV Peak Grad:      3.9 mmHg AV Mean Grad:      2.0 mmHg LVOT Vmax:         90.20 cm/s LVOT Vmean:        58.700 cm/s LVOT VTI:          0.194 m LVOT/AV VTI ratio: 0.91  AORTA Ao  Root diam: 3.90 cm Ao Asc diam:  3.30 cm MITRAL VALVE MV Area (PHT): 3.27 cm    SHUNTS MV Area VTI:   3.60 cm    Systemic VTI:  0.19 m MV Peak grad:  3.4 mmHg    Systemic Diam: 2.20 cm MV Mean grad:  1.0 mmHg MV Vmax:       0.92 m/s MV Vmean:      51.3 cm/s MV Decel Time: 232 msec MV E velocity: 52.20 cm/s MV A velocity: 54.80 cm/s MV E/A ratio:  0.95 Lorine Bears MD Electronically signed by Lorine Bears MD Signature Date/Time: 01/13/2023/3:43:52 PM    Final    MR BRAIN WO CONTRAST  Result Date: 01/13/2023 CLINICAL DATA:  Provided history: Neuro deficit, acute, stroke suspected. Right-sided face and arm numbness. EXAM: MRI HEAD WITHOUT CONTRAST TECHNIQUE: Multiplanar, multiecho pulse sequences of the brain and surrounding structures were obtained without intravenous contrast. COMPARISON:  Non-contrast head CT and CT angiogram head/neck 01/13/2023. Brain MRI 09/16/2021. FINDINGS: Brain: Cerebral volume is normal. Known chronic infarcts within the right aspect of the medulla and within the right cerebellar hemisphere, some of  which were better appreciated on the prior brain MRI of 09/16/2021 (subacute at that time). There is no acute infarct. No evidence of an intracranial mass. No chronic intracranial blood products. No extra-axial fluid collection. No midline shift. Vascular: Prior endovascular occlusion of the right vertebral artery. Flow voids preserved elsewhere within the proximal large arterial vessels. Skull and upper cervical spine: No focal suspicious marrow lesion. Sinuses/Orbits: No mass or acute finding within the imaged orbits. Small mucous retention cysts within the left maxillary sinus. Other: Non-expansile asymmetric T2 hyperintense signal within the right petrous apex, likely reflecting trapped fluid. IMPRESSION: 1. No evidence of an acute intracranial abnormality. 2. Known chronic infarcts within the right aspect of the medulla and right cerebellar hemisphere. 3. Small mucous retention cyst within the left maxillary sinus. 4. T2 hyperintense signal within the right petrous apex, likely reflecting trapped fluid. Electronically Signed   By: Jackey Loge D.O.   On: 01/13/2023 14:34   CT ANGIO HEAD NECK W WO CM  Result Date: 01/13/2023 CLINICAL DATA:  Right arm numbness for 2 days. Lightheadedness and dizziness. History of right vertebral artery dissection. EXAM: CT ANGIOGRAPHY HEAD AND NECK WITH AND WITHOUT CONTRAST TECHNIQUE: Multidetector CT imaging of the head and neck was performed using the standard protocol during bolus administration of intravenous contrast. Multiplanar CT image reconstructions and MIPs were obtained to evaluate the vascular anatomy. Carotid stenosis measurements (when applicable) are obtained utilizing NASCET criteria, using the distal internal carotid diameter as the denominator. RADIATION DOSE REDUCTION: This exam was performed according to the departmental dose-optimization program which includes automated exposure control, adjustment of the mA and/or kV according to patient size and/or use  of iterative reconstruction technique. CONTRAST:  75mL OMNIPAQUE IOHEXOL 350 MG/ML SOLN COMPARISON:  CTA head and neck 09/15/2021 FINDINGS: The study is mildly motion degraded. CTA NECK FINDINGS Aortic arch: Normal variant aortic arch branching pattern with common origin of the brachiocephalic and left common carotid arteries. Widely patent arch vessel origins. Right carotid system: Patent without evidence of stenosis or dissection. Left carotid system: Patent without evidence of stenosis or dissection. Vertebral arteries: The left vertebral artery is patent without evidence a significant stenosis or dissection within limitations of motion artifact. Status post endovascular occlusion of the right vertebral artery with multiple coils throughout the V3 segment. Skeleton: No acute osseous abnormality or suspicious osseous  lesion. Other neck: No evidence of cervical lymphadenopathy or mass. Upper chest: Clear lung apices. Review of the MIP images confirms the above findings CTA HEAD FINDINGS Anterior circulation: The internal carotid arteries are widely patent from skull base to carotid termini. ACAs and MCAs are patent without evidence of a flow limiting A1 or M1 stenosis. Diffusely irregular appearance of the small and medium-sized vessels is felt to be technical. No aneurysm is identified. Posterior circulation: The intracranial left vertebral artery is patent and supplies the basilar. Minimal opacification of the right V4 segment may be retrograde. The basilar artery is patent without evidence of a significant stenosis. There are robust posterior communicating arteries bilaterally with aplastic right and hypoplastic left P1 segments. Both PCAs are patent without evidence of a flow limiting proximal stenosis. No aneurysm is identified. Venous sinuses: Patent. Anatomic variants: Fetal origin of the PCAs. Hypoplastic right A1 segment. Review of the MIP images confirms the above findings These results were communicated  to Dr. Selina Cooley at 9:14 am on 01/13/2023 by text page via the [facility] messaging system. IMPRESSION: 1. No emergent large vessel occlusion or flow limiting proximal stenosis in the head or neck. 2. Status post endovascular occlusion of the right vertebral artery. Electronically Signed   By: Sebastian Ache M.D.   On: 01/13/2023 09:25   CT HEAD CODE STROKE WO CONTRAST  Result Date: 01/13/2023 CLINICAL DATA:  Code stroke. Neuro deficit, acute, stroke suspected. Right arm numbness over the last 2 days. EXAM: CT HEAD WITHOUT CONTRAST TECHNIQUE: Contiguous axial images were obtained from the base of the skull through the vertex without intravenous contrast. RADIATION DOSE REDUCTION: This exam was performed according to the departmental dose-optimization program which includes automated exposure control, adjustment of the mA and/or kV according to patient size and/or use of iterative reconstruction technique. COMPARISON:  MRI 09/16/2021. FINDINGS: Brain: Previous strokes in the right medulla and cerebellum cannot be appreciated by CT. Cerebral hemispheres appear normal without evidence of old or acute insult. No mass, hemorrhage, hydrocephalus or extra-axial collection. Vascular: No abnormal vascular finding. Skull: Normal Sinuses/Orbits: Clear/normal Other: None ASPECTS (Alberta Stroke Program Early CT Score) - Ganglionic level infarction (caudate, lentiform nuclei, internal capsule, insula, M1-M3 cortex): 7 - Supraganglionic infarction (M4-M6 cortex): 3 Total score (0-10 with 10 being normal): 10 IMPRESSION: 1. No acute finding by CT. Previous strokes in the right medulla and cerebellum cannot be appreciated by CT. 2. Aspects is 10. These results were communicated to Dr. Selina Cooley at 8:39 am on 01/13/2023 by text page via the [facility] messaging system. Electronically Signed   By: Paulina Fusi M.D.   On: 01/13/2023 08:41    Microbiology: Results for orders placed or performed during the hospital encounter of 09/14/21  Resp Panel  by RT-PCR (Flu A&B, Covid) Nasopharyngeal Swab     Status: None   Collection Time: 09/15/21  2:00 AM   Specimen: Nasopharyngeal Swab; Nasopharyngeal(NP) swabs in vial transport medium  Result Value Ref Range Status   SARS Coronavirus 2 by RT PCR NEGATIVE NEGATIVE Final    Comment: (NOTE) SARS-CoV-2 target nucleic acids are NOT DETECTED.  The SARS-CoV-2 RNA is generally detectable in upper respiratory specimens during the acute phase of infection. The lowest concentration of SARS-CoV-2 viral copies this assay can detect is 138 copies/mL. A negative result does not preclude SARS-Cov-2 infection and should not be used as the sole basis for treatment or other patient management decisions. A negative result may occur with  improper specimen collection/handling, submission of  specimen other than nasopharyngeal swab, presence of viral mutation(s) within the areas targeted by this assay, and inadequate number of viral copies(<138 copies/mL). A negative result must be combined with clinical observations, patient history, and epidemiological information. The expected result is Negative.  Fact Sheet for Patients:  BloggerCourse.com  Fact Sheet for Healthcare Providers:  SeriousBroker.it  This test is no t yet approved or cleared by the [place] FDA and  has been authorized for detection and/or diagnosis of SARS-CoV-2 by FDA under an Emergency Use Authorization (EUA). This EUA will remain  in effect (meaning this test can be used) for the duration of the COVID-19 declaration under Section 564(b)(1) of the Act, 21 U.S.C.section 360bbb-3(b)(1), unless the authorization is terminated  or revoked sooner.       Influenza A by PCR NEGATIVE NEGATIVE Final   Influenza B by PCR NEGATIVE NEGATIVE Final    Comment: (NOTE) The Xpert Xpress SARS-CoV-2/FLU/RSV plus assay is intended as an aid in the diagnosis of influenza from Nasopharyngeal swab  specimens and should not be used as a sole basis for treatment. Nasal washings and aspirates are unacceptable for Xpert Xpress SARS-CoV-2/FLU/RSV testing.  Fact Sheet for Patients: BloggerCourse.com  Fact Sheet for Healthcare Providers: SeriousBroker.it  This test is not yet approved or cleared by the [place] FDA and has been authorized for detection and/or diagnosis of SARS-CoV-2 by FDA under an Emergency Use Authorization (EUA). This EUA will remain in effect (meaning this test can be used) for the duration of the COVID-19 declaration under Section 564(b)(1) of the Act, 21 U.S.C. section 360bbb-3(b)(1), unless the authorization is terminated or revoked.  Performed at [facility], [address], [place], [place] [address]   MRSA Next Gen by PCR, Nasal     Status: None   Collection Time: 09/15/21  6:55 AM   Specimen: Nasal Mucosa; Nasal Swab  Result Value Ref Range Status   MRSA by PCR Next Gen NOT DETECTED NOT DETECTED Final    Comment: (NOTE) The GeneXpert MRSA Assay (FDA approved for NASAL specimens only), is one component of a comprehensive MRSA colonization surveillance program. It is not intended to diagnose MRSA infection nor to guide or monitor treatment for MRSA infections. Test performance is not FDA approved in patients less than 93 years old. Performed at [facility], [address] [place], [place] [address]     Labs: CBC: Recent Labs  Lab 01/13/23 0855  WBC 7.3  NEUTROABS 3.4  HGB 15.2  HCT 46.7  MCV 83.5  PLT 200   Basic Metabolic Panel: Recent Labs  Lab 01/13/23 0835 01/13/23 0855  NA  --  135  K  --  3.2*  CL  --  103  CO2  --  25  GLUCOSE  --  114*  BUN  --  13  CREATININE  --  0.92  CALCIUM  --  8.6*  MG 2.1  --    Liver Function Tests: Recent Labs  Lab 01/13/23 0855  AST 25  ALT 39  ALKPHOS 67  BILITOT 0.8  PROT 7.5  ALBUMIN 4.1   CBG: Recent Labs   Lab 01/13/23 0827  GLUCAP 112*    Discharge time spent: greater than 30 minutes.  Signed: Enedina Finner, MD [facility] 01/14/2023

## 2023-01-14 NOTE — Progress Notes (Signed)
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  Last known well 10 PM before he went to bed and he woke up with symptoms at 530.  He has had fluctuating right arm and face numbness for the past 2 days but it had resolved prior to going to bed.   OT comments  Mr. Akel was seen for OT treatment on this date. Upon arrival to room pt supine in bed, with RN present at bedside, agreeable to OT Tx session. OT facilitated ADL management as described below. See ADL section for additional details regarding occupational performance. Pt continues to be functionally limited by decreased safety awareness and transient numbness in his face/arms. Pt return verbalizes understanding of education provided t/o session. Pt is progressing toward OT goals and continues to benefit from skilled OT services to maximize return to PLOF and minimize risk of future falls, injury, caregiver burden, and readmission. Will continue to follow POC as written.      Recommendations for follow up therapy are one component of a multi-disciplinary discharge planning process, led by the attending physician.  Recommendations may be updated based on patient status, additional functional criteria and insurance authorization.    Assistance Recommended at Discharge Intermittent Supervision/Assistance  Patient can return home with the following  Assistance with cooking/housework;Direct supervision/assist for medications management;Assist for transportation;Help with stairs or ramp for entrance   Equipment Recommendations  Tub/shower seat    Recommendations for Other Services      Precautions /  Restrictions Precautions Precautions: Fall Restrictions Weight Bearing Restrictions: No       Mobility Bed Mobility Overal bed mobility: Needs Assistance Bed Mobility: Supine to Sit, Sit to Supine     Supine to sit: Modified independent (Device/Increase time) Sit to supine: Modified independent (Device/Increase time)        Transfers Overall transfer level: Needs assistance Equipment used: None Transfers: Sit to/from Stand Sit to Stand: Modified independent (Device/Increase time)                 Balance Overall balance assessment: Needs assistance Sitting-balance support: Feet supported, No upper extremity supported Sitting balance-Leahy Scale: Normal     Standing balance support: No upper extremity supported Standing balance-Leahy Scale: Good                             ADL either performed or assessed with clinical judgement   ADL Overall ADL's : Needs assistance/impaired Eating/Feeding: Modified independent;Sitting   Grooming: Modified independent                               Functional mobility during ADLs: Modified independent General ADL Comments: Pt remains functionally limited by ongoing dizziness with positional changes and transient numbness in UEs. He is able to perform functional mobility at a community distance with MOD I (intermittent use of hallway rails for stability but otherwise steady without UE support). Educated on compensatory ADL/IADL management strategies including medication management, healthcare management, and DC recommendations.    Extremity/Trunk Assessment              Vision Patient Visual Report: No change  from baseline     Perception     Praxis      Cognition Arousal/Alertness: Awake/alert Behavior During Therapy: WFL for tasks assessed/performed, Anxious Overall Cognitive Status: Within Functional Limits for tasks assessed                                 General  Comments: verbalizes anxiety over limited communication from heatlhcare team over the last 24 hours. [employer]thwer[employer], pleasant, eager to discuss compensatory ADL management strategies.        Exercises Other Exercises Other Exercises: OT facilitated ADL management with assist and education as described above. See ADL section for additional details. Extensive conversation re: DC recs.    Shoulder Instructions       General Comments      Pertinent Vitals/ Pain       Pain Assessment Pain Assessment: No/denies pain  Home Living Family/patient expects to be discharged to:: Private residence                                        Prior Functioning/Environment              Frequency  Min 1X/week        Progress Toward Goals  OT Goals(current goals can now be found in the care plan section)  Progress towards OT goals: Progressing toward goals  Acute Rehab OT Goals Patient Stated Goal: To go home OT Goal Formulation: With patient Time For Goal Achievement: 01/27/23 Potential to Achieve Goals: Good  Plan Discharge plan remains appropriate;Frequency remains appropriate    Co-evaluation                 AM-PAC OT "6 Clicks" Daily Activity     Outcome Measure   Help from another person eating meals?: None Help from another person taking care of personal grooming?: None Help from another person toileting, which includes using toliet, bedpan, or urinal?: A Little Help from another person bathing (including washing, rinsing, drying)?: A Little Help from another person to put on and taking off regular upper body clothing?: None Help from another person to put on and taking off regular lower body clothing?: A Little 6 Click Score: 21    End of Session    OT Visit Diagnosis: Unsteadiness on feet (R26.81)   Activity Tolerance Patient tolerated treatment well   Patient Left in bed;with call bell/phone within reach   Nurse Communication Mobility  status        Time: 1610-9604 OT Time Calculation (min): 39 min  Charges: OT General Charges $OT Visit: 1 Visit OT Treatments $Self Care/Home Management : 38-52 mins  Rockney Ghee, M.S., OTR/L 01/14/23, 11:53 AM

## 2023-01-18 ENCOUNTER — Emergency Department (HOSPITAL_BASED_OUTPATIENT_CLINIC_OR_DEPARTMENT_OTHER)
Admission: EM | Admit: 2023-01-18 | Discharge: 2023-01-18 | Disposition: A | Discharge status: Home / Self Care | Payer: No Typology Code available for payment source | Attending: Emergency Medicine | Admitting: Emergency Medicine

## 2023-01-18 ENCOUNTER — Encounter (HOSPITAL_BASED_OUTPATIENT_CLINIC_OR_DEPARTMENT_OTHER): Payer: Self-pay

## 2023-01-18 ENCOUNTER — Emergency Department (HOSPITAL_BASED_OUTPATIENT_CLINIC_OR_DEPARTMENT_OTHER): Payer: No Typology Code available for payment source | Admitting: Radiology

## 2023-01-18 DIAGNOSIS — R0789 Other chest pain: Secondary | ICD-10-CM | POA: Insufficient documentation

## 2023-01-18 DIAGNOSIS — R079 Chest pain, unspecified: Secondary | ICD-10-CM

## 2023-01-18 DIAGNOSIS — Z7901 Long term (current) use of anticoagulants: Secondary | ICD-10-CM | POA: Diagnosis not present

## 2023-01-18 DIAGNOSIS — R42 Dizziness and giddiness: Secondary | ICD-10-CM | POA: Diagnosis not present

## 2023-01-18 LAB — BASIC METABOLIC PANEL
Anion gap: 9 (ref 5–15)
BUN: 14 mg/dL (ref 6–20)
CO2: 25 mmol/L (ref 22–32)
Calcium: 9.5 mg/dL (ref 8.9–10.3)
Chloride: 105 mmol/L (ref 98–111)
Creatinine, Ser: 0.9 mg/dL (ref 0.61–1.24)
GFR, Estimated: >60 mL/min (ref >60)
Glucose, Bld: 102 mg/dL — ABNORMAL HIGH (ref 70–99)
Potassium: 3.8 mmol/L (ref 3.5–5.1)
Sodium: 139 mmol/L (ref 135–145)

## 2023-01-18 LAB — CBC
HCT: 48 % (ref 39.0–52.0)
Hemoglobin: 16 g/dL (ref 13.0–17.0)
MCH: 27.9 pg (ref 26.0–34.0)
MCHC: 33.3 g/dL (ref 30.0–36.0)
MCV: 83.6 fL (ref 80.0–100.0)
Platelets: 216 10*3/uL (ref 150–400)
RBC: 5.74 MIL/uL (ref 4.22–5.81)
RDW: 13.3 % (ref 11.5–15.5)
WBC: 7.2 10*3/uL (ref 4.0–10.5)
nRBC: 0 % (ref 0.0–0.2)

## 2023-01-18 LAB — TROPONIN I (HIGH SENSITIVITY)
Troponin I (High Sensitivity): 4 ng/L (ref <18)
Troponin I (High Sensitivity): 5 ng/L (ref <18)

## 2023-01-18 NOTE — ED Notes (Signed)
Dc instructions reviewed with patient. Patient voiced understanding. Dc with belongings.  °

## 2023-01-18 NOTE — ED Triage Notes (Signed)
He c/o mid and left-sided chest pain "for a while now". He recently had 2 D echo "but they didn't tell me much about that". His sknin is normal, warm and dry and he is breathing normally.

## 2023-01-18 NOTE — ED Provider Notes (Signed)
[place] EMERGENCY DEPARTMENT AT [facility] Provider Note   CSN: [id number] Arrival date & time: 01/18/23  1009     History  Chief Complaint  Patient presents with   Chest Pain    David Shields is a 32 y.o. male.  The history is provided by the patient and medical records. No language interpreter was used.  Chest Pain    32 year old male significant history of vertebral artery dissection, paroxysmal atrial fibrillation on Eliquis, prior stroke in 2023 2/2 vertebral artery dissection, severe left ventricular hypertrophy, anxiety, presenting complaining of chest pain.  Patient reports throughout the day today he noticed a tightness and pressure across his chest.  It is mild in severity but persistent.  He initially drove to the ER but sensation went away.  He drove home and wants to return home, the pressure returns and his wife instructed patient to come to the for further assessment.  States that when he was just released from the hospital 4 days ago after developing continued tingling to his face that was thought to be related to the returns of his neurovasculature function from prior stroke.  He also reported some of his medication was adjusted and he endorsed minimal lightheadedness but attributed to the medication.  He does not endorse any fever or chills no nausea vomiting diarrhea no shortness of breath no exertional chest pain no arm pain or jaw pain.  Denies tobacco use.  He has resumed taking his Eliquis.  Home Medications Prior to Admission medications   Medication Sig Start Date End Date Taking? Authorizing Provider  apixaban (ELIQUIS) 5 MG TABS tablet Take 1 tablet (5 mg total) by mouth 2 (two) times daily. 01/14/23   Enedina Finner, MD  atorvastatin (LIPITOR) 80 MG tablet Take 1 tablet (80 mg total) by mouth daily. 01/14/23   Enedina Finner, MD  nicotine (NICODERM CQ - DOSED IN MG/24 HOURS) 21 mg/24hr patch Place 1 patch (21 mg total) onto the skin daily. 01/15/23    Enedina Finner, MD      Allergies    Banana    Review of Systems   Review of Systems  Cardiovascular:  Positive for chest pain.  All other systems reviewed and are negative.   Physical Exam Updated Vital Signs BP (!) 132/100   Pulse 80   Temp 98.3 F (36.8 C)   Resp 18   SpO2 100%  Physical Exam Vitals and nursing note reviewed.  Constitutional:      General: He is not in acute distress.    Appearance: He is well-developed.  HENT:     Head: Atraumatic.  Eyes:     Conjunctiva/sclera: Conjunctivae normal.  Cardiovascular:     Rate and Rhythm: Normal rate and regular rhythm.     Pulses: Normal pulses.     Heart sounds: Normal heart sounds.  Pulmonary:     Effort: Pulmonary effort is normal.     Breath sounds: Normal breath sounds.  Chest:     Chest wall: No tenderness.  Abdominal:     Palpations: Abdomen is soft.     Tenderness: There is no abdominal tenderness.  Musculoskeletal:     Cervical back: Neck supple.  Skin:    Capillary Refill: Capillary refill takes less than 2 seconds.     Findings: No rash.  Neurological:     Mental Status: He is alert and oriented to person, place, and time.     ED Results / Procedures / Treatments   Labs (  all labs ordered are listed, but only abnormal results are displayed) Labs Reviewed  BASIC METABOLIC PANEL - Abnormal; Notable for the following components:      Result Value   Glucose, Bld 102 (*)    All other components within normal limits  CBC  TROPONIN I (HIGH SENSITIVITY)  TROPONIN I (HIGH SENSITIVITY)    EKG EKG Interpretation Date/Time:  Saturday January 18 2023 10:23:08 EDT Ventricular Rate:  101 PR Interval:  130 QRS Duration:  86 QT Interval:  354 QTC Calculation: 459 R Axis:   46  Text Interpretation: Sinus tachycardia Cannot rule out Anterior infarct , age undetermined Abnormal ECG No significant change since last tracing Confirmed by Elayne Snare ([id number]) on 01/18/2023 2:58:38 PM  Radiology DG Chest  2 View  Result Date: 01/18/2023 CLINICAL DATA:  Chest pain EXAM: CHEST - 2 VIEW COMPARISON:  09/06/2022 FINDINGS: Low volume chest with indistinct density at the left base best seen on the frontal view. No edema, effusion, or pneumothorax. Normal heart size and mediastinal contours. IMPRESSION: Atelectasis or infiltrate at the left lung base. Electronically Signed   By: Tiburcio Pea M.D.   On: 01/18/2023 12:12    Procedures Procedures    Medications Ordered in ED Medications - No data to display  ED Course/ Medical Decision Making/ A&P             HEART Score: 2                Medical Decision Making Amount and/or Complexity of Data Reviewed Labs: ordered. Radiology: ordered.   BP (!) 132/100   Pulse 80   Temp 98.3 F (36.8 C)   Resp 18   SpO2 100%   3:72 PM  32 year old male significant history of vertebral artery dissection, paroxysmal atrial fibrillation on Eliquis, prior stroke in 2023 2/2 vertebral artery dissection, severe left ventricular hypertrophy, anxiety, presenting complaining of chest pain.  Patient reports throughout the day today he noticed a tightness and pressure across his chest.  It is mild in severity but persistent.  He initially drove to the ER but sensation went away.  He drove home and wants to return home, the pressure returns and his wife instructed patient to come to the for further assessment.  States that when he was just released from the hospital 4 days ago after developing continued sensation to his face that was thought to be related to the returns of his neurovasculature function from prior stroke.  He also reported some of his medication was adjusted and he endorsed minimal lightheadedness but attributed to the medication.  He does not endorse any fever or chills no nausea vomiting diarrhea no shortness of breath no exertional chest pain no arm pain or jaw pain.  Denies tobacco use.  He has resumed taking his Eliquis.  On exam patient is resting  comfortably in the bed appears to be in no acute discomfort.  Heart with normal rate and rhythm, lungs clear to auscultation bilaterally abdomen is soft and nontender he has intact radial pulse bilaterally  Vital signs notable for elevated heart rate of 103 that was documented however in the room his heart rate was normal.  -Labs ordered, independently viewed and interpreted by me.  Labs remarkable for negative delta trop.   -The patient was maintained on a cardiac monitor.  I personally viewed and interpreted the cardiac monitored which showed an underlying rhythm of: sinus tachycardia -Imaging independently viewed and interpreted by me and I agree with  radiologist's interpretation.  Result remarkable for CXR showing atelectasis or infiltrate at the left lung base.  Pt however without fever or cough or sob so doubt PNA -This patient presents to the ED for concern of cp, this involves an extensive number of treatment options, and is a complaint that carries with it a high risk of complications and morbidity.  The differential diagnosis includes pleurisy, pna, acs, pe, gerd, gastritis, dissection -Co morbidities that complicate the patient evaluation includes afib, stroke, LVH, anxiety -Treatment includes reassurance -Reevaluation of the patient after these medicines showed that the patient improved -PCP office notes or outside notes reviewed -Escalation to admission/observation considered: patients feels much better, is comfortable with discharge, and will follow up with PCP -Prescription medication considered, patient comfortable with OTC -Social Determinant of Health considered which includes tobacco use  3:10 PM Workup overall reassuring, negative delta troponin.  At this time have low suspicion for ACS causing his symptoms as it is atypical of cardiac disease.  Initial chest x-ray shows signs of atelectasis versus pneumonia of the left lung base.  Patient does not have symptoms to suggest  pneumonia.  Discussed this with patient, we opted for no antibiotic at this time.  Encourage patient to follow-up patient with his doctor for further care, outpatient cardiology referral given as needed as well.  Return precaution given.         Final Clinical Impression(s) / ED Diagnoses Final diagnoses:  Chest pain, unspecified type    Rx / DC Orders ED Discharge Orders          Ordered    Ambulatory referral to Cardiology       Comments: If you have not heard from the Cardiology office within the next 72 hours please call [phone].   01/18/23 1510              Fayrene Helper, PA-C 01/18/23 1510    Theresia Lo, Burdette K, DO 01/18/23 [phone]

## 2023-06-12 ENCOUNTER — Telehealth: Payer: Self-pay | Admitting: Physician Assistant

## 2023-06-12 NOTE — Telephone Encounter (Signed)
Pt dropped off a work clearance form, he hasn't been seen since 2023 with Dayna, missed his appt with Dr Elease Hashimoto. But does have a future appt with Lenze. I did let him know that someone may call him concerning what will need to happen in order for clearance. Will be in providers box by eod.   He said to sign the form anywhere. And to fax form to [phone] when complete.

## 2023-06-13 ENCOUNTER — Telehealth: Payer: Self-pay | Admitting: Neurology

## 2023-06-13 NOTE — Telephone Encounter (Signed)
Pt said employer requesting a form that need to be signed by the physician. Because of stroke had in 2023. Want to be sure pt do not have to work with any restriction. Would like a call back to discuss if need to be seen can be a work in. Would need appt as soon possible.

## 2023-06-16 NOTE — Telephone Encounter (Signed)
Pt returned call and accepted today's appt. Scheduled

## 2023-06-16 NOTE — Telephone Encounter (Signed)
Called the patient back. There was no answer. LVM asking for the patient to call back.  Pt has not been seen in over a year and would need a follow up to be seen and evaluated to complete a letter/form.  Was going to offer cancellation that Shanda Bumps NP had for today.  **If pt returns call, please schedule with a NP, any NP ok

## 2023-06-17 ENCOUNTER — Ambulatory Visit (INDEPENDENT_AMBULATORY_CARE_PROVIDER_SITE_OTHER): Payer: Self-pay | Admitting: Adult Health

## 2023-06-17 ENCOUNTER — Encounter: Payer: Self-pay | Admitting: Adult Health

## 2023-06-17 ENCOUNTER — Telehealth: Payer: Self-pay

## 2023-06-17 VITALS — BP 133/84 | HR 97 | Ht 73.0 in | Wt 233.0 lb

## 2023-06-17 DIAGNOSIS — I7774 Dissection of vertebral artery: Secondary | ICD-10-CM

## 2023-06-17 DIAGNOSIS — I639 Cerebral infarction, unspecified: Secondary | ICD-10-CM

## 2023-06-17 NOTE — Telephone Encounter (Signed)
na

## 2023-06-17 NOTE — Patient Instructions (Addendum)
Continue  Eliquis   and atorvastatin for secondary stroke prevention  Follow-up with cardiology next month as scheduled  Please schedule follow-up visit with Dr. Corliss Skains for ongoing monitoring and management of prior right vertebral artery dissection - they can be contacted at [phone]  Continue to follow up with PCP regarding blood pressure and cholesterol management  Maintain strict control of hypertension with blood pressure goal below 130/90 and cholesterol with LDL cholesterol (bad cholesterol) goal below 70 mg/dL.   Signs of a Stroke? Follow the BEFAST method:  Balance Watch for a sudden loss of balance, trouble with coordination or vertigo Eyes Is there a sudden loss of vision in one or both eyes? Or double vision?  Face: Ask the person to smile. Does one side of the face droop or is it numb?  Arms: Ask the person to raise both arms. Does one arm drift downward? Is there weakness or numbness of a leg? Speech: Ask the person to repeat a simple phrase. Does the speech sound slurred/strange? Is the person confused ? Time: If you observe any of these signs, call 911.        Thank you for coming to see [place] at [facility]. I hope we have been able to provide you high quality care today.  You may receive a patient satisfaction survey over the next few weeks. We would appreciate your feedback and comments so that we may continue to improve ourselves and the health of our patients.

## 2023-06-17 NOTE — Progress Notes (Signed)
[facility] [address] [place]. [place] [address] [phone]       OFFICE FOLLOW UP NOTE  David Shields Date of Birth:  [birth year] Medical Record Number:  [id number]   Primary neurologist: Dr. Pearlean Shields Reason for visit: Stroke follow-up   Chief Complaint  Patient presents with   Follow-up    Patient in room #3 and alone. Patient states he is well and stable, with no new concerns.     HPI:   Update 06/17/2023 JM: Returns for follow-up visit.  Patient was seen at [facility] ED in July with right arm and face numbness.  CTH and MR brain negative for acute abnormalities.  CTA negative LVO, s/p endovascular occlusion of R VA. suspected symptoms in setting of TIA.  Resumed Eliquis and atorvastatin.  Advised outpatient follow-up with VA and discharged home. Of note, he did mention right facial numbness intermittently at prior visit with Dr. Pearlean Shields and recommended MRI brain but never completed.   Denies new or reoccurring stroke/TIA symptoms since that time. Is able to maintain all normal activities without difficulty. He was recently hired at the [employer] and needs letter stating he is fit for regular duty without restrictions. He has been maintaining a regular job since his stroke and able to maintain all normal activities and duties without difficulty.  Remains on Eliquis and atorvastatin, denies side effects.  Routinely follows with PCP for stroke risk factor management.  Has follow-up visit with cardiology next month.  He has never had follow-up with Dr. Corliss Shields.  No further questions or concerns at this time.     History provided for reference purposes only Consult visit 04/29/2022 Dr. Pearlean Shields: Mr. David Shields is a 32 year old African-American male seen today for initial office consultation visit for stroke.  History is obtained from the patient and review of electronic medical records and I personally reviewed pertinent available imaging films in PACS.David Shields is a 32 y.o. male with PMH significant for anxiety who went to bed at 2100 on 09/15/2021 and woke up an hour later, attempted to get out of bed and fell immediately. He seemed off so family called 911. He was noted to have a L gaze preference, R sided ataxia, dysconjugate gaze, headache and vomited. He was brought in to the ED as code stroke. On arrival, symptoms were persistent. CTH w/o contrast negative for a large hypodensity concerning for a large territory infarct or hyperdensity concerning for an ICH. He was offered tNKASE but initially hesitant and not comfortable with the associated risk of ICH but wanted to wait to discuss with his wife. He and wife eventually opted for tNKASE. CT angio head and neck with concern for possible R vert dissection at V2/3 with patent V4 and no basilar occlusion, bilateral PCA patent, R and L PICA appear patent. CT Perfusion inconclusive due to motion.STAT MRI Brain was obtained to clarify if the noted dissection resulted in any significant strokes. MRI Brain was notable for punctate stroke in the medulla and a punctate stroke in the R cerbellum. He had worsening of his deficit and developed somnolence, R facial droop, worsening ataxia and reported new numbness. A STAT CTH was negative for an ICH. Code IR was activated at this time to get emergent diagnostic angiogram and for potential intervention if needed. Dr. Corliss Shields discussed details, risks and benefits of intervention with patient and his wife inclding 10% risk of ICH and they consented to intervention and possible stenting.  LKW: 2100 on  09/15/21 mRS: 0 tNKASE: yes offered. Discussed risks and benefits with patient and he was hesitant to make a decision without speaking with his wife. I spoke to patient's wife and they were hesitant but eventually consented to tNKASE. Thrombectomy: emergent diagnostic angiogram and possible intervention was offered when his exam worsened and exam out of proportion to the  MRI findings. NIHSS - 6 Dr. Corliss Shields took the patient for intervention but was unable to successfully deploy a stent and had to do therapeutic occlusion of the right V3, V4 segment of the right vertebral artery.  Procedure was uneventful.  Was kept in the ICU close neurological monitoring blood pressures currently controlled.  He was started on dual antiplatelet therapy aspirin and Plavix.  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  Ejection fraction 50 to 55%.  LDL cholesterol was 130 mg percent.  Globin A1c was 5.4.  Patient was discharged home with outpatient speech therapy as well.  He states his balance and recovered completely back to normal.  He has stopped aspirin currently on Plavix alone.  States his blood pressures under good control today it is 130/89.  He is tolerating Plavix without bruising or bleeding.  Remains on Lipitor tolerating well without the right side of his face months or so..  There are no clear triggers for this. He was seen in the cardiology clinic in April 2023 and plan was to undergo cardiac MRI 30-day heart monitor for A-fib for unclear reasons without      ROS:   14 system review of systems is positive for those listed in HPI and all other systems negative  PMH:  Past Medical History:  Diagnosis Date   Anxiety    PAF (paroxysmal atrial fibrillation) (HCC)    Severe left ventricular hypertrophy    Stroke (cerebrum) (HCC)    Vertebral artery dissection (HCC)     Social History:  Social History   Socioeconomic History   Marital status: Single    Spouse name: Not on file   Number of children: Not on file   Years of education: Not on file   Highest education level: Not on file  Occupational History   Not on file  Tobacco Use   Smoking status: Some Days    Current packs/day: 0.50    Types: Cigarettes, Cigars   Smokeless tobacco:  Never  Vaping Use   Vaping status: Never Used  Substance and Sexual Activity   Alcohol use: Yes    Comment: occasionally   Drug use: No   Sexual activity: Not on file  Other Topics Concern   Not on file  Social History Narrative   Not on file   Social Drivers of Health   Financial Resource Strain: Not on file  Food Insecurity: No Food Insecurity (01/13/2023)   Hunger Vital Sign    Worried About Running Out of Food in the Last Year: Never true    Ran Out of Food in the Last Year: Never true  Transportation Needs: No Transportation Needs (01/13/2023)   PRAPARE - Administrator, Civil Service (Medical): No    Lack of Transportation (Non-Medical): No  Physical Activity: Not on file  Stress: Not on file (06/05/2023)  Social Connections: Not on file  Intimate Partner Violence: Not At Risk (01/13/2023)   Humiliation, Afraid, Rape, and Kick questionnaire    Fear of Current or Ex-Partner: No  Emotionally Abused: No    Physically Abused: No    Sexually Abused: No    Medications:   Current Outpatient Medications on File Prior to Visit  Medication Sig Dispense Refill   apixaban (ELIQUIS) 5 MG TABS tablet Take 1 tablet (5 mg total) by mouth 2 (two) times daily. 90 tablet 3   atorvastatin (LIPITOR) 80 MG tablet Take 1 tablet (80 mg total) by mouth daily. 30 tablet 3   nicotine (NICODERM CQ - DOSED IN MG/24 HOURS) 21 mg/24hr patch Place 1 patch (21 mg total) onto the skin daily. 28 patch 0   No current facility-administered medications on file prior to visit.    Allergies:   Allergies  Allergen Reactions   Banana Hives, Shortness Of Breath and Itching    Physical Exam Today's Vitals   06/17/23 1306  BP: 133/84  Pulse: 97  Weight: 233 lb (105.7 kg)  Height: 6\' 1"  (1.854 m)   Body mass index is 30.74 kg/m.  General: well developed, well nourished pleasant young African-American male, seated, in no evident distress  Neurologic Exam Mental Status: Awake and  fully alert.  Fluent speech and language.  Oriented to place and time. Recent and remote memory intact. Attention span, concentration and fund of knowledge appropriate. Mood and affect appropriate.  Cranial Nerves: Pupils equal, briskly reactive to light. Extraocular movements full without nystagmus. Visual fields full to confrontation. Hearing intact. Facial sensation intact. Face, tongue, palate moves normally and symmetrically.  Motor: Normal bulk and tone. Normal strength in all tested extremity muscles. Sensory.: intact to touch , pinprick , position and vibratory sensation  Coordination: Rapid alternating movements normal in all extremities. Finger-to-nose and heel-to-shin performed accurately bilaterally. Gait and Station: Arises from chair without difficulty. Stance is normal. Gait demonstrates normal stride length and balance without use of AD Reflexes: 1+ and symmetric. Toes downgoing.       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  Vascular risk factors of hypertension, hyperlipidemia vertebral artery dissection and transient A-fib    PLAN:  1.  Right cerebellar stroke -new employer requesting clearance letter for working - as no residual stroke deficit, cleared for working without restriction, letter provided. Was faxed to number provided by Malva Cogan, CMA -Follow-up with cardiology as scheduled next month -Continue Eliquis and atorvastatin for history of A-fib and secondary stroke prevention measures managed/prescribed by PCP/cardiology -Continue close PCP follow-up for aggressive stroke risk factor management -Stroke labs 05/2023: LDL 68, A1c 5.8   2. R VA dissection s/p coiling  -CTA 12/2022 s/p endovascular occlusion of  right VA with multiple collaterals throughout the V3 segment  -advised f/u needed with IR (rec'd 2 week outpatient f/u after procedure in 08/2021 but has not yet had f/u). Office number provided. Patient verbalized understanding    Overall stable from stroke standpoint without further recommendations and risk factors are managed by PCP.  Can follow-up on an as-needed basis at this time.   I spent 25 minutes of face-to-face and non-face-to-face time with patient.  This included previsit chart review, lab review, study review, order entry, electronic health record documentation, patient education and discussion regarding above diagnoses and treatment plan and answered all other questions to patient's satisfaction  Ihor Austin, [facility]  [facility] [address] [place], [place] [address]  Phone [phone] Fax [phone] Note: This document was prepared with digital dictation and possible smart phrase technology. Any transcriptional errors that result from this process are unintentional.

## 2023-07-22 NOTE — Telephone Encounter (Addendum)
Work clearance form was in my physical inbox. Our office cannot sign this form since the patient is overdue for follow-up as outlined below. The patient has an appointment with Jacolyn Reedy 07/30/23. Have given form to D. Jayme Cloud to pass along for that OV - can you let the patient know we cannot sign the form until seen back in clinic? Thanks.  Will cc to [facility] as well for upcoming OV.

## 2023-07-22 NOTE — Telephone Encounter (Signed)
LVM for pt with details from Ronie Spies, PA note. Will place in [employer].

## 2023-07-27 NOTE — Progress Notes (Deleted)
  Cardiology Office Note:  .   Date:  07/27/2023  ID:  David Shields, DOB 1991/04/22, MRN [id number] PCP: Blanchie Serve  [facility] Providers Cardiologist:  Kristeen Miss, MD { Click to update primary MD,subspecialty MD or APP then REFRESH:1}   History of Present Illness: .   David Shields is a 33 y.o. male   with history of   stroke 08/2021 suspected due to right vertebral artery dissection, transient atrial fibrillation, LVH, small aortic root aneurysm, anxiety, suspected sleep apnea who is seen for follow-up.  cerebral angiogram showing dissected RT vertebral artery and occluded right vertebrobasilar junction. He underwent endovascular coiling. He was also noted to be in atrial fibrillation on arrival (rates 90s-1teens). 2D echo 09/16/21 showed EF 50-55%, severe asymmetric LVH, indeterminate diastolic function, small aortic root aneurysm. He was started on ASA 81mg  and Plavix along with high dose statin. He converted to NSR spontaneously overnight in the hospital. Dr. Elease Hashimoto recommended to defer anticoagulation pending outpatient 2 week monitor. He also suggested obtaining cardiac MRI after monitor, given degree of LVH. The patient also planned to f/u at the [facility] for sleep study.  MRI/monitor never done  ROS: ***  Studies Reviewed: Marland Kitchen         Prior CV Studies: {Select studies to display:26339}  Echo bubble study 12/2022 IMPRESSIONS     1. Left ventricular ejection fraction, by estimation, is 55 to 60%. The  left ventricle has normal function. The left ventricle has no regional  wall motion abnormalities. The left ventricular internal cavity size was  mildly dilated. There is mild  asymmetric left ventricular hypertrophy of the septal segment. Left  ventricular diastolic parameters were normal.   2. Right ventricular systolic function is normal. The right ventricular  size is normal. Tricuspid regurgitation signal is inadequate for assessing  PA pressure.   3. The  mitral valve is normal in structure. No evidence of mitral valve  regurgitation. No evidence of mitral stenosis.   4. The aortic valve is normal in structure. Aortic valve regurgitation is  not visualized. No aortic stenosis is present.   5. The inferior vena cava is normal in size with greater than 50%  respiratory variability, suggesting right atrial pressure of 3 mmHg.   6. Agitated saline contrast bubble study was negative, with no evidence  of any interatrial shunt.    Risk Assessment/Calculations:   {Does this patient have ATRIAL FIBRILLATION?:[phone]} No BP recorded.  {Refresh Note OR Click here to enter BP  :1}***       Physical Exam:   VS:  There were no vitals taken for this visit.   Wt Readings from Last 3 Encounters:  06/17/23 233 lb (105.7 kg)  01/13/23 222 lb (100.7 kg)  09/06/22 222 lb (100.7 kg)    GEN: Well nourished, well developed in no acute distress NECK: No JVD; No carotid bruits CARDIAC: ***RRR, no murmurs, rubs, gallops RESPIRATORY:  Clear to auscultation without rales, wheezing or rhonchi  ABDOMEN: Soft, non-tender, non-distended EXTREMITIES:  No edema; No deformity   ASSESSMENT AND PLAN: .    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 stress test, cath, DCCV, TEE, etc)?   Press F2        :[id number]}  Dispo: ***  Signed, Jacolyn Reedy, PA-C

## 2023-07-30 ENCOUNTER — Ambulatory Visit: Payer: No Typology Code available for payment source | Attending: Physician Assistant | Admitting: Physician Assistant

## 2023-09-08 NOTE — Telephone Encounter (Signed)
 Call placed to pt.  Form is still circulating around the office from December.  Was in [employer], PA-C's box.  Pt missed appt in January and has not future scheduled appointments.  Left pt a message to call back to see what we need to do with form / if anything.

## 2023-10-06 ENCOUNTER — Other Ambulatory Visit: Payer: Self-pay

## 2023-10-06 ENCOUNTER — Emergency Department
Admission: EM | Admit: 2023-10-06 | Discharge: 2023-10-07 | Disposition: A | Discharge status: Other Acute Inpatient Hospital | Attending: Emergency Medicine | Admitting: Emergency Medicine

## 2023-10-06 DIAGNOSIS — F329 Major depressive disorder, single episode, unspecified: Secondary | ICD-10-CM | POA: Insufficient documentation

## 2023-10-06 DIAGNOSIS — Z7901 Long term (current) use of anticoagulants: Secondary | ICD-10-CM | POA: Insufficient documentation

## 2023-10-06 DIAGNOSIS — R45851 Suicidal ideations: Secondary | ICD-10-CM | POA: Insufficient documentation

## 2023-10-06 DIAGNOSIS — R7401 Elevation of levels of liver transaminase levels: Secondary | ICD-10-CM | POA: Insufficient documentation

## 2023-10-06 LAB — CBC
HCT: 51.1 % (ref 39.0–52.0)
Hemoglobin: 16.9 g/dL (ref 13.0–17.0)
MCH: 27.8 pg (ref 26.0–34.0)
MCHC: 33.1 g/dL (ref 30.0–36.0)
MCV: 84.2 fL (ref 80.0–100.0)
Platelets: 232 10*3/uL (ref 150–400)
RBC: 6.07 MIL/uL — ABNORMAL HIGH (ref 4.22–5.81)
RDW: 13.6 % (ref 11.5–15.5)
WBC: 8.3 10*3/uL (ref 4.0–10.5)
nRBC: 0 % (ref 0.0–0.2)

## 2023-10-06 LAB — COMPREHENSIVE METABOLIC PANEL WITH GFR
ALT: 45 U/L — ABNORMAL HIGH (ref 0–44)
AST: 27 U/L (ref 15–41)
Albumin: 4.5 g/dL (ref 3.5–5.0)
Alkaline Phosphatase: 76 U/L (ref 38–126)
Anion gap: 8 (ref 5–15)
BUN: 12 mg/dL (ref 6–20)
CO2: 28 mmol/L (ref 22–32)
Calcium: 9.6 mg/dL (ref 8.9–10.3)
Chloride: 104 mmol/L (ref 98–111)
Creatinine, Ser: 0.99 mg/dL (ref 0.61–1.24)
GFR, Estimated: >60 mL/min (ref >60)
Glucose, Bld: 102 mg/dL — ABNORMAL HIGH (ref 70–99)
Potassium: 3.9 mmol/L (ref 3.5–5.1)
Sodium: 140 mmol/L (ref 135–145)
Total Bilirubin: 2 mg/dL — ABNORMAL HIGH (ref 0.0–1.2)
Total Protein: 8.1 g/dL (ref 6.5–8.1)

## 2023-10-06 LAB — URINE DRUG SCREEN, QUALITATIVE (ARMC ONLY)
Amphetamines, Ur Screen: NOT DETECTED
Barbiturates, Ur Screen: NOT DETECTED
Benzodiazepine, Ur Scrn: NOT DETECTED
Cannabinoid 50 Ng, Ur ~~LOC~~: NOT DETECTED
Cocaine Metabolite,Ur ~~LOC~~: NOT DETECTED
MDMA (Ecstasy)Ur Screen: NOT DETECTED
Methadone Scn, Ur: NOT DETECTED
Opiate, Ur Screen: NOT DETECTED
Phencyclidine (PCP) Ur S: NOT DETECTED
Tricyclic, Ur Screen: NOT DETECTED

## 2023-10-06 LAB — SALICYLATE LEVEL: Salicylate Lvl: <7 mg/dL — ABNORMAL LOW (ref 7.0–30.0)

## 2023-10-06 LAB — ETHANOL: Alcohol, Ethyl (B): <10 mg/dL (ref <10)

## 2023-10-06 LAB — ACETAMINOPHEN LEVEL: Acetaminophen (Tylenol), Serum: <10 ug/mL — ABNORMAL LOW (ref 10–30)

## 2023-10-06 MED ORDER — ATORVASTATIN CALCIUM 20 MG PO TABS
80.0000 mg | ORAL_TABLET | Freq: Every day | ORAL | Status: DC
  Filled 2023-10-06: qty 4

## 2023-10-06 MED ORDER — APIXABAN 5 MG PO TABS
5.0000 mg | ORAL_TABLET | Freq: Two times a day (BID) | ORAL | Status: DC
  Administered 2023-10-06 – 2023-10-07 (×2): 5 mg via ORAL
  Filled 2023-10-06 (×2): qty 1

## 2023-10-06 MED ORDER — DULOXETINE HCL 60 MG PO CPEP
60.0000 mg | ORAL_CAPSULE | Freq: Every day | ORAL | Status: DC
  Filled 2023-10-06 (×2): qty 1

## 2023-10-06 MED ORDER — METOPROLOL SUCCINATE ER 50 MG PO TB24
25.0000 mg | ORAL_TABLET | Freq: Every day | ORAL | Status: DC
  Filled 2023-10-06 (×2): qty 1

## 2023-10-06 MED ORDER — NICOTINE 21 MG/24HR TD PT24
21.0000 mg | MEDICATED_PATCH | Freq: Every day | TRANSDERMAL | Status: DC
  Filled 2023-10-06 (×2): qty 1

## 2023-10-06 NOTE — ED Notes (Signed)
 Pt took Eliquis at [facility]

## 2023-10-06 NOTE — ED Notes (Signed)
 Pt refused PM snack at this time.

## 2023-10-06 NOTE — Consult Note (Signed)
 [facility] Psychiatric Consult Initial  Patient Name: .David Shields  MRN: [id number]  DOB: May 15, 1991  Consult Order details:  Orders (From admission, onward)     Start     Ordered   10/06/23 1554  IP CONSULT TO PSYCHIATRY       Ordering Provider: Concha Se, MD  Provider:  (Not yet assigned)  Question Answer Comment  Place call to: [phone]   Reason for Consult Admit      10/06/23 1553   10/06/23 1554  CONSULT TO CALL ACT TEAM       Ordering Provider: Concha Se, MD  Provider:  (Not yet assigned)  Question:  Reason for Consult?  Answer:  Psych consult   10/06/23 1553   10/06/23 1133  CONSULT TO CALL ACT TEAM       Ordering Provider: Jene Every, MD  Provider:  (Not yet assigned)  Question:  Reason for Consult?  Answer:  SI   10/06/23 1132             Mode of Visit: Tele-visit Virtual Statement:TELE PSYCHIATRY ATTESTATION & CONSENT As the provider for this telehealth consult, I attest that I verified the patient's identity using two separate identifiers, introduced myself to the patient, provided my credentials, disclosed my location, and performed this encounter via a HIPAA-compliant, real-time, face-to-face, two-way, interactive audio and video platform and with the full consent and agreement of the patient (or guardian as applicable.) Patient physical location: [facility]. Telehealth provider physical location: home office in state of [place].   Video start time:   Video end time:      Psychiatry Consult Evaluation  Service Date: October 06, 2023 LOS:  LOS: 0 days  Chief Complaint IVC, Suicidal Ideation  Primary Psychiatric Diagnoses  Major Depressive Disorder  Assessment  David Shields is a 33 y.o. male admitted: Presented to the EDfor 10/06/2023  3:07 PM for suicidal ideation and behavior, specifically sending a video to his wife showing himself holding a gun to his head. He carries the psychiatric diagnoses of Depression Disorder,  Anxiety, Adjustment disorder and insomnia and has a past medical history of stroke.  His current presentation of low mood, insomnia, poor appetite, feelings of worthlessness, and suicidal behavior is most consistent with an exacerbation of Major Depressive Disorder. He meets criteria for Major Depressive Episode based on depressed mood, significant insomnia, diminished appetite, fatigue, and suicidal behavior.  Current outpatient psychotropic medications include ( specific medication unknown), patient does not recall. The patient has discontinued due to lightheadedness and nausea. He was non-compliant with medications prior to admission as evidenced by self-discontinuation due to side effects without provider follow-up.  On initial examination, patient appears fatigued, has restricted affect, and reports significant psychosocial stressors. No acute psychosis or mania observed. He currently denies active suicidal ideation, though his recent behavior suggests elevated risk. Please see plan below for detailed recommendations.  Diagnoses:  Active Hospital problems: Active Problems:   * No active hospital problems. *    Plan   ## Psychiatric Medication Recommendations:  Fluoxetine 20mg  and zyprexa 5 mg qhs  ## Medical Decision Making Capacity: Not specifically addressed in this encounter    ## Disposition:-- We recommend inpatient psychiatric hospitalization after medical hospitalization. Patient has been involuntarily committed on 10/06/23.   ## Behavioral / Environmental: - No specific recommendations at this time.     ## Safety and Observation Level:  - Based on my clinical evaluation, I estimate the patient  to be at low risk of self harm in the current setting. - At this time, we recommend  routine. This decision is based on my review of the chart including patient's history and current presentation, interview of the patient, mental status examination, and consideration of suicide risk  including evaluating suicidal ideation, plan, intent, suicidal or self-harm behaviors, risk factors, and protective factors. This judgment is based on our ability to directly address suicide risk, implement suicide prevention strategies, and develop a safety plan while the patient is in the clinical setting. Please contact our team if there is a concern that risk level has changed.  CSSR Risk Category:C-SSRS RISK CATEGORY: High Risk  Suicide Risk Assessment: Patient has following modifiable risk factors for suicide: access to guns and untreated depression, which we are addressing by inpatient hospitalization. Patient has following non-modifiable or demographic risk factors for suicide: male gender Patient has the following protective factors against suicide: Supportive family and Minor children in the home  Thank you for this consult request. Recommendations have been communicated to the primary team.  We will recommend inpatient at this time.   De Burrs, NP       History of Present Illness  Relevant Aspects of Hospital ED Course:  Admitted on 10/06/2023 for suicidal behavior.  Patient Report:  David Shields, a 33 year old male, presented to the ED under an involuntary commitment (IVC) after sending a video to his wife in which he made suicidal statements while holding a firearm to his head. At present, the patient denies suicidal ideation (SI), homicidal ideation (HI), and auditory or visual hallucinations (AVH). He identifies psychosocial stressors including difficulty securing employment, financial strain, and increased expectations during the spring season. He reports feeling overwhelmed due to the costs associated with spring break, his children's and wife's birthdays in April, and Mother's Day.  Patient reports not eating for the past two days, citing an attempt to lose weight. He reports sleeping only 3-4 hours per night and endorses feelings of worthlessness, stating he feels  like he "could be doing more." Despite current stressors, he denies substance use except for alcohol on special occasions and denies any legal issues.  Patient lives with his wife and their three children full-time, and shares custody of one child from a previous relationship. He is currently attending college at [employer] ([employer]), majoring in psychology. 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 He reports a prior trial of psychotropic medication but discontinued use due to side effects including lightheadedness and nausea; he is unable to recall the name of the medication.  Psych ROS:  Depression: yes Anxiety:  yes Mania (lifetime and current): no Psychosis: (lifetime and current): no   Review of Systems  Constitutional: Negative.   HENT: Negative.    Eyes: Negative.   Respiratory: Negative.    Cardiovascular: Negative.   Gastrointestinal: Negative.   Genitourinary: Negative.   Musculoskeletal: Negative.   Skin: Negative.   Neurological: Negative.   Psychiatric/Behavioral:  Positive for depression. The patient has insomnia.      Psychiatric and Social History  Psychiatric History:  Information collected from Patient  Prev Dx/Sx: Depression Disorder, Anxiety, Adjustment disorder and insomnia  Current Psych Provider: unknown Home Meds (current): unknown Previous Med Trials: unknown Therapy: unknown  Prior Psych Hospitalization: unknown  Prior Self Harm: denies Prior Violence: unknown  Family Psych History: mother (anxiety) Family Hx suicide: denies  Social History:  Developmental Hx: unknown Educational Hx: attends  [employer] Occupational Hx: Advertising account executive Hx: none Living Situation: with wife and 3 children Spiritual Hx: unknown Access to weapons/lethal means: yes   Substance History Alcohol: denies  Tobacco: denies Illicit drugs: denies Prescription drug abuse: denies Rehab hx: denies  Exam Findings   Vital  Signs:  Temp:  [98.6 F (37 C)] 98.6 F (37 C) (04/07 1129) Pulse Rate:  [86] 86 (04/07 1129) Resp:  [20] 20 (04/07 1129) BP: (147)/(101) 147/101 (04/07 1129) SpO2:  [100 %] 100 % (04/07 1129) Weight:  [102.1 kg] 102.1 kg (04/07 1130) Blood pressure (!) 147/101, pulse 86, temperature 98.6 F (37 C), resp. rate 20, height 6\' 1"  (1.854 m), weight 102.1 kg, SpO2 100%. Body mass index is 29.69 kg/m.  Physical Exam HENT:     Head: Normocephalic.     Nose: Nose normal.     Mouth/Throat:     Pharynx: Oropharynx is clear.  Pulmonary:     Effort: Pulmonary effort is normal.  Musculoskeletal:        General: Normal range of motion.     Cervical back: Normal range of motion.  Skin:    General: Skin is dry.  Neurological:     Mental Status: He is alert.      Other History   These have been pulled in through the EMR, reviewed, and updated if appropriate.  Family History:  The patient's family history includes Cancer in his maternal grandmother and paternal uncle; Depression in his mother; Hypertension in his maternal grandmother.  Medical History: Past Medical History:  Diagnosis Date   Anxiety    PAF (paroxysmal atrial fibrillation) (HCC)    Severe left ventricular hypertrophy    Stroke (cerebrum) (HCC)    Vertebral artery dissection (HCC)     Surgical History: Past Surgical History:  Procedure Laterality Date   ADENOIDECTOMY     IR ANGIO INTRA EXTRACRAN SEL COM CAROTID INNOMINATE BILAT MOD SED  09/15/2021   IR ANGIO VERTEBRAL SEL VERTEBRAL UNI L MOD SED  09/15/2021   IR ANGIOGRAM FOLLOW UP STUDY  09/15/2021   IR ANGIOGRAM FOLLOW UP STUDY  09/15/2021   IR ANGIOGRAM FOLLOW UP STUDY  09/15/2021   IR ANGIOGRAM FOLLOW UP STUDY  09/15/2021   IR ANGIOGRAM FOLLOW UP STUDY  09/15/2021   IR ANGIOGRAM FOLLOW UP STUDY  09/15/2021   IR ANGIOGRAM FOLLOW UP STUDY  09/15/2021   IR ANGIOGRAM FOLLOW UP STUDY  09/15/2021   IR ANGIOGRAM FOLLOW UP STUDY  09/15/2021   IR ANGIOGRAM FOLLOW UP STUDY   09/15/2021   IR ANGIOGRAM FOLLOW UP STUDY  09/15/2021   IR ANGIOGRAM FOLLOW UP STUDY  09/15/2021   IR CT HEAD LTD  09/15/2021   IR PERCUTANEOUS ART THROMBECTOMY/INFUSION INTRACRANIAL INC DIAG ANGIO  09/15/2021   IR TRANSCATH/EMBOLIZ  09/15/2021   RADIOLOGY WITH ANESTHESIA N/A 09/15/2021   Procedure: IR WITH ANESTHESIA;  Surgeon: Julieanne Cotton, MD;  Location: [facility] OR;  Service: Radiology;  Laterality: N/A;     Medications:  No current facility-administered medications for this encounter.  Current Outpatient Medications:    apixaban (ELIQUIS) 5 MG TABS tablet, Take 1 tablet (5 mg total) by mouth 2 (two) times daily., Disp: 90 tablet, Rfl: 3   atorvastatin (LIPITOR) 80 MG tablet, Take 1 tablet (80 mg total) by mouth daily., Disp: 30 tablet, Rfl: 3   DULoxetine (CYMBALTA) 30 MG capsule, Take 60 mg by mouth daily., Disp: , Rfl:    metoprolol succinate (TOPROL-XL) 50 MG 24 hr tablet,  Take 0.5 tablets by mouth daily., Disp: , Rfl:    nicotine (NICODERM CQ - DOSED IN MG/24 HOURS) 21 mg/24hr patch, Place 1 patch (21 mg total) onto the skin daily., Disp: 28 patch, Rfl: 0  Allergies: Allergies  Allergen Reactions   Banana Hives, Shortness Of Breath and Itching    De Burrs, NP

## 2023-10-06 NOTE — ED Notes (Signed)
 Pt. To BHU from ED ambulatory without difficulty, to room  BHU 3. Report from  [facility]. Pt. Is alert and oriented, warm and dry in no distress. Pt. Denies SI, HI, and AVH. Pt. Calm and cooperative. Pt. Made aware of security cameras and Q15 minute rounds. Pt. Encouraged to let Nursing staff know of any concerns or needs.   ENVIRONMENTAL ASSESSMENT Potentially harmful objects out of patient reach: Yes.   Personal belongings secured: Yes.   Patient dressed in hospital provided attire only: Yes.   Plastic bags out of patient reach: Yes.   Patient care equipment (cords, cables, call bells, lines, and drains) shortened, removed, or accounted for: Yes.   Equipment and supplies removed from bottom of stretcher: Yes.   Potentially toxic materials out of patient reach: Yes.   Sharps container removed or out of patient reach: Yes.

## 2023-10-06 NOTE — ED Provider Notes (Signed)
 [facility] Provider Note    Event Date/Time   First MD Initiated Contact with Patient 10/06/23 1531     (approximate)   History   Psychiatric Evaluation   HPI  David Shields is a 33 y.o. male patient comes in with PD under IVC.  Wife Took out Papers Saying That Patient Had Held a Gun to His Head and Never Came Home Last Night.  Patient Was Found in the Parking Lot with a Gun Still in His Possession.  I reviewed a note from 01/14/2023 where patient has a history of A-fib on Eliquis, prior vertebral artery dissection, hyperlipidemia, depression, anxiety  Patient states that he is here because his wife filed paperwork on him given he had reported SI.  He states that he did have SI yesterday but does not have it today.  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  Patient does report that he takes Eliquis and he denies any falls or hitting his head or any injuries.  He takes this from a prior stroke. He denies any other medical concerns.    Physical Exam   Triage Vital Signs: ED Triage Vitals  Encounter Vitals Group     BP 10/06/23 1129 (!) 147/101     Systolic BP Percentile --      Diastolic BP Percentile --      Pulse Rate 10/06/23 1129 86     Resp 10/06/23 1129 20     Temp 10/06/23 1129 98.6 F (37 C)     Temp src --      SpO2 10/06/23 1129 100 %     Weight 10/06/23 1130 225 lb (102.1 kg)     Height 10/06/23 1130 6\' 1"  (1.854 m)     Head Circumference --      Peak Flow --      Pain Score 10/06/23 1130 0     Pain Loc --      Pain Education --      Exclude from Growth Chart --     Most recent vital signs: Vitals:   10/06/23 1129  BP: (!) 147/101  Pulse: 86  Resp: 20  Temp: 98.6 F (37 C)  SpO2: 100%     General: Awake, no distress.  CV:  Good peripheral perfusion.  Resp:  Normal effort.  Abd:  No distention.  Other:  Concern for SI   ED Results / Procedures / Treatments    Labs (all labs ordered are listed, but only abnormal results are displayed) Labs Reviewed  COMPREHENSIVE METABOLIC PANEL WITH GFR - Abnormal; Notable for the following components:      Result Value   Glucose, Bld 102 (*)    ALT 45 (*)    Total Bilirubin 2.0 (*)    All other components within normal limits  SALICYLATE LEVEL - Abnormal; Notable for the following components:   Salicylate Lvl <7.0 (*)    All other components within normal limits  ACETAMINOPHEN LEVEL - Abnormal; Notable for the following components:   Acetaminophen (Tylenol), Serum <10 (*)    All other components within normal limits  CBC - Abnormal; Notable for the following components:   RBC 6.07 (*)    All other components within normal limits  ETHANOL  URINE DRUG SCREEN, QUALITATIVE ([facility] ONLY)    PROCEDURES:  Critical Care performed: No  Procedures   MEDICATIONS ORDERED IN ED: Medications - No  data to display   IMPRESSION / MDM / ASSESSMENT AND PLAN / ED COURSE  I reviewed the triage vital signs and the nursing notes.   Patient's presentation is most consistent with acute presentation with potential threat to life or bodily function.   Pt is without any acute medical complaints. No exam findings to suggest medical cause of current presentation. Will order psychiatric screening labs and discuss further w/ psychiatric service.  D/d includes but is not limited to psychiatric disease, behavioral/personality disorder, inadequate socioeconomic support, medical.  Based on HPI, exam, unremarkable labs, no concern for acute medical problem at this time. No rigidity, clonus, hyperthermia, focal neurologic deficit, diaphoresis, tachycardia, meningismus, ataxia, gait abnormality or other finding to suggest this visit represents a non-psychiatric problem. Screening labs reviewed.    Given this, pt medically cleared, to be dispositioned per Psych.    The patient has been placed in psychiatric observation due to  the need to provide a safe environment for the patient while obtaining psychiatric consultation and evaluation, as well as ongoing medical and medication management to treat the patient's condition.  The patient has been placed under full IVC at this time.    Urine drug is negative.  CMP shows slightly elevated ALT.  Alcohol negative salicylate negative Tylenol negative CBC reassuring  Patient is cleared to be discussed with psychiatry team  The patient is on the cardiac monitor to evaluate for evidence of arrhythmia and/or significant heart rate changes.      FINAL CLINICAL IMPRESSION(S) / ED DIAGNOSES   Final diagnoses:  Suicidal ideation     Rx / DC Orders   ED Discharge Orders     None        Note:  This document was prepared using Dragon voice recognition software and may include unintentional dictation errors.   Concha Se, MD 10/06/23 [phone]

## 2023-10-06 NOTE — ED Notes (Signed)
 Pt given a cup of ice cream, 2 warm blankets, and security turned on the TV in the room for the pt.

## 2023-10-06 NOTE — ED Triage Notes (Signed)
 Patient to ED with [facility] PD and IVC papers complete. Wife took out papers stating that yesterday patient called her with a gun to his head and he never came home. Reported him missing this morning and patient was found in a parking lot with gun still in his possession.

## 2023-10-06 NOTE — BH Assessment (Signed)
 Comprehensive Clinical Assessment (CCA) Note  10/06/2023 David Shields [id number] Recommendations for Services/Supports/Treatments: Psych NP Marzella Schlein. determined pt. meets psychiatric inpatient criteria. David Shields is a 33year-old, Black, Non-Hispanic English-speaking male. Pt is under IVC. Per triage note: Patient to ED with [facility] PD and IVC papers complete. Wife took out papers stating that yesterday patient called her with a gun to his head and he never came home. Reported him missing this morning and patient was found in a parking lot with gun still in his possession.  On assessment, pt. was anxious, but cooperative. The pt. reported that he'd endorsed SI to his wife yesterday and didn't come home and she'd had him committed due to being concerned. The patient reported that he lives with his wife and 3 kids.  Pt reported having any hx of trauma linked to time in the [employer]. The pt. explained that he'd gotten overwhelmed and "had a moment". The pt. reported having issues with anxiety/depression and that he has not been taking his prescribed medications due to having adverse side effects. Pt identified finances, not having a job, and feeling like he could be doing more as his main stressors. Pt endorsed having symptoms of depression such as decreased appetite, getting too little sleep, and feeling worthless. Pt was not responding to internal stimuli. Pt had clear and coherent speech and thoughts were linear. Pt was oriented x4. Pt presented with depressed mood; affect was anxious. Pt denied current SI, HI, and AV/H.     Chief Complaint:  Chief Complaint  Patient presents with   Psychiatric Evaluation   Visit Diagnosis: Major Depressive Disorder     CCA Screening, Triage and Referral (STR)  Patient Reported Information How did you hear about [place]? No data recorded Referral name: No data recorded Referral phone number: No data recorded  Whom do you see for routine medical  problems? No data recorded Practice/Facility Name: No data recorded Practice/Facility Phone Number: No data recorded Name of Contact: No data recorded Contact Number: No data recorded Contact Fax Number: No data recorded Prescriber Name: No data recorded Prescriber Address (if known): No data recorded  What Is the Reason for Your Visit/Call Today? No data recorded How Long Has This Been Causing You Problems? No data recorded What Do You Feel Would Help You the Most Today? No data recorded  Have You Recently Been in Any Inpatient Treatment (Hospital/Detox/Crisis Center/28-Day Program)? No data recorded Name/Location of Program/Hospital:No data recorded How Long Were You There? No data recorded When Were You Discharged? No data recorded  Have You Ever Received Services From [facility] Before? No data recorded Who Do You See at [facility]? No data recorded  Have You Recently Had Any Thoughts About Hurting Yourself? No data recorded Are You Planning to Commit Suicide/Harm Yourself At This time? No data recorded  Have you Recently Had Thoughts About Hurting Someone Karolee Ohs? No data recorded Explanation: No data recorded  Have You Used Any Alcohol or Drugs in the Past 24 Hours? No data recorded How Long Ago Did You Use Drugs or Alcohol? No data recorded What Did You Use and How Much? No data recorded  Do You Currently Have a Therapist/Psychiatrist? No data recorded Name of Therapist/Psychiatrist: No data recorded  Have You Been Recently Discharged From Any Office Practice or Programs? No data recorded Explanation of Discharge From Practice/Program: No data recorded    CCA Screening Triage Referral Assessment Type of Contact: No data recorded Is this Initial or Reassessment? No  data recorded Date Telepsych consult ordered in [facility]:  No data recorded Time Telepsych consult ordered in [facility]:  No data recorded  Patient Reported Information Reviewed? No data recorded Patient Left Without  Being Seen? No data recorded Reason for Not Completing Assessment: No data recorded  Collateral Involvement: No data recorded  Does Patient Have a Court Appointed Legal Guardian? No data recorded Name and Contact of Legal Guardian: No data recorded If Minor and Not Living with Parent(s), Who has Custody? No data recorded Is CPS involved or ever been involved? No data recorded Is APS involved or ever been involved? No data recorded  Patient Determined To Be At Risk for Harm To Self or Others Based on Review of Patient Reported Information or Presenting Complaint? No data recorded Method: No data recorded Availability of Means: No data recorded Intent: No data recorded Notification Required: No data recorded Additional Information for Danger to Others Potential: No data recorded Additional Comments for Danger to Others Potential: No data recorded Are There Guns or Other Weapons in Your Home? No data recorded Types of Guns/Weapons: No data recorded Are These Weapons Safely Secured?                            No data recorded Who Could Verify You Are Able To Have These Secured: No data recorded Do You Have any Outstanding Charges, Pending Court Dates, Parole/Probation? No data recorded Contacted To Inform of Risk of Harm To Self or Others: No data recorded  Location of Assessment: No data recorded  Does Patient Present under Involuntary Commitment? No data recorded IVC Papers Initial File Date: No data recorded  [place] of Residence: No data recorded  Patient Currently Receiving the Following Services: No data recorded  Determination of Need: No data recorded  Options For Referral: No data recorded    CCA Biopsychosocial Intake/Chief Complaint:  No data recorded Current Symptoms/Problems: No data recorded  Patient Reported Schizophrenia/Schizoaffective Diagnosis in Past: No data recorded  Strengths: No data recorded Preferences: No data recorded Abilities: No data  recorded  Type of Services Patient Feels are Needed: No data recorded  Initial Clinical Notes/Concerns: No data recorded  Mental Health Symptoms Depression:  No data recorded  Duration of Depressive symptoms: No data recorded  Mania:  No data recorded  Anxiety:   No data recorded  Psychosis:  No data recorded  Duration of Psychotic symptoms: No data recorded  Trauma:  No data recorded  Obsessions:  No data recorded  Compulsions:  No data recorded  Inattention:  No data recorded  Hyperactivity/Impulsivity:  No data recorded  Oppositional/Defiant Behaviors:  No data recorded  Emotional Irregularity:  No data recorded  Other Mood/Personality Symptoms:  No data recorded   Mental Status Exam Appearance and self-care  Stature:  No data recorded  Weight:  No data recorded  Clothing:  No data recorded  Grooming:  No data recorded  Cosmetic use:  No data recorded  Posture/gait:  No data recorded  Motor activity:  No data recorded  Sensorium  Attention:  No data recorded  Concentration:  No data recorded  Orientation:  No data recorded  Recall/memory:  No data recorded  Affect and Mood  Affect:  No data recorded  Mood:  No data recorded  Relating  Eye contact:  No data recorded  Facial expression:  No data recorded  Attitude toward examiner:  No data recorded  Thought and Language  Speech flow:  No data recorded  Thought content:  No data recorded  Preoccupation:  No data recorded  Hallucinations:  No data recorded  Organization:  No data recorded  [employer] of Knowledge:  No data recorded  Intelligence:  No data recorded  Abstraction:  No data recorded  Judgement:  No data recorded  Reality Testing:  No data recorded  Insight:  No data recorded  Decision Making:  No data recorded  Social Functioning  Social Maturity:  No data recorded  Social Judgement:  No data recorded  Stress  Stressors:  No data recorded  Coping Ability:  No data recorded   Skill Deficits:  No data recorded  Supports:  No data recorded    Religion:    Leisure/Recreation:    Exercise/Diet:     CCA Employment/Education Employment/Work Situation:    Education:     CCA Family/Childhood History Family and Relationship History:    Childhood History:     Child/Adolescent Assessment:     CCA Substance Use Alcohol/Drug Use:                           ASAM's:  Six Dimensions of Multidimensional Assessment  Dimension 1:  Acute Intoxication and/or Withdrawal Potential:      Dimension 2:  Biomedical Conditions and Complications:      Dimension 3:  Emotional, Behavioral, or Cognitive Conditions and Complications:     Dimension 4:  Readiness to Change:     Dimension 5:  Relapse, Continued use, or Continued Problem Potential:     Dimension 6:  Recovery/Living Environment:     ASAM Severity Score:    ASAM Recommended Level of Treatment:     Substance use Disorder (SUD)    Recommendations for Services/Supports/Treatments:    DSM5 Diagnoses: Patient Active Problem List   Diagnosis Date Noted   TIA (transient ischemic attack) 01/14/2023   Stroke (HCC) 01/13/2023   PAF (paroxysmal atrial fibrillation) (HCC) 01/13/2023   HLD (hyperlipidemia) 01/13/2023   Hypokalemia 01/13/2023   Acute ischemic multifocal posterior circulation stroke involving right-sided vessel (HCC) 09/15/2021   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   Vertebral artery dissection (HCC)    Atrial fibrillation (HCC)    Tinea versicolor 02/01/2013   Insomnia 02/01/2013   Depression with anxiety 09/19/2011   Panic attack 09/19/2011   Tobacco user 09/19/2011    Patient Centered Plan: Patient is on the following Treatment Plan(s):  Anxiety and Depression   Referrals to Alternative Service(s): Referred to Alternative Service(s):   Place:   Date:   Time:    Referred to Alternative Service(s):   Place:   Date:   Time:    Referred to  Alternative Service(s):   Place:   Date:   Time:    Referred to Alternative Service(s):   Place:   Date:   Time:      @[facility] @  [address] [place], [place]

## 2023-10-07 ENCOUNTER — Inpatient Hospital Stay
Admission: AD | Admit: 2023-10-07 | Discharge: 2023-10-11 | DRG: 885 | Disposition: A | Discharge status: Home / Self Care | Source: Intra-hospital | Attending: Psychiatry | Admitting: Psychiatry

## 2023-10-07 ENCOUNTER — Encounter: Payer: Self-pay | Admitting: Adult Health

## 2023-10-07 ENCOUNTER — Other Ambulatory Visit: Payer: Self-pay

## 2023-10-07 DIAGNOSIS — Z7901 Long term (current) use of anticoagulants: Secondary | ICD-10-CM

## 2023-10-07 DIAGNOSIS — Z8249 Family history of ischemic heart disease and other diseases of the circulatory system: Secondary | ICD-10-CM

## 2023-10-07 DIAGNOSIS — Z9889 Other specified postprocedural states: Secondary | ICD-10-CM | POA: Diagnosis not present

## 2023-10-07 DIAGNOSIS — Z91199 Patient's noncompliance with other medical treatment and regimen due to unspecified reason: Secondary | ICD-10-CM

## 2023-10-07 DIAGNOSIS — Z560 Unemployment, unspecified: Secondary | ICD-10-CM | POA: Diagnosis not present

## 2023-10-07 DIAGNOSIS — F4322 Adjustment disorder with anxiety: Secondary | ICD-10-CM | POA: Diagnosis present

## 2023-10-07 DIAGNOSIS — F329 Major depressive disorder, single episode, unspecified: Secondary | ICD-10-CM | POA: Diagnosis not present

## 2023-10-07 DIAGNOSIS — E785 Hyperlipidemia, unspecified: Secondary | ICD-10-CM | POA: Diagnosis present

## 2023-10-07 DIAGNOSIS — Z818 Family history of other mental and behavioral disorders: Secondary | ICD-10-CM

## 2023-10-07 DIAGNOSIS — G4733 Obstructive sleep apnea (adult) (pediatric): Secondary | ICD-10-CM | POA: Diagnosis present

## 2023-10-07 DIAGNOSIS — F17210 Nicotine dependence, cigarettes, uncomplicated: Secondary | ICD-10-CM | POA: Diagnosis present

## 2023-10-07 DIAGNOSIS — I480 Paroxysmal atrial fibrillation: Secondary | ICD-10-CM | POA: Diagnosis present

## 2023-10-07 DIAGNOSIS — Z604 Social exclusion and rejection: Secondary | ICD-10-CM | POA: Diagnosis present

## 2023-10-07 DIAGNOSIS — Z809 Family history of malignant neoplasm, unspecified: Secondary | ICD-10-CM | POA: Diagnosis not present

## 2023-10-07 DIAGNOSIS — R45851 Suicidal ideations: Secondary | ICD-10-CM | POA: Diagnosis present

## 2023-10-07 DIAGNOSIS — Z8679 Personal history of other diseases of the circulatory system: Secondary | ICD-10-CM

## 2023-10-07 DIAGNOSIS — Z9185 Personal history of military service: Secondary | ICD-10-CM | POA: Diagnosis not present

## 2023-10-07 DIAGNOSIS — Z91018 Allergy to other foods: Secondary | ICD-10-CM

## 2023-10-07 DIAGNOSIS — Z8673 Personal history of transient ischemic attack (TIA), and cerebral infarction without residual deficits: Secondary | ICD-10-CM

## 2023-10-07 DIAGNOSIS — Z803 Family history of malignant neoplasm of breast: Secondary | ICD-10-CM | POA: Diagnosis not present

## 2023-10-07 DIAGNOSIS — F332 Major depressive disorder, recurrent severe without psychotic features: Secondary | ICD-10-CM | POA: Diagnosis present

## 2023-10-07 DIAGNOSIS — G4700 Insomnia, unspecified: Secondary | ICD-10-CM | POA: Diagnosis present

## 2023-10-07 DIAGNOSIS — Z79899 Other long term (current) drug therapy: Secondary | ICD-10-CM

## 2023-10-07 DIAGNOSIS — Z9049 Acquired absence of other specified parts of digestive tract: Secondary | ICD-10-CM | POA: Diagnosis not present

## 2023-10-07 MED ORDER — NICOTINE 21 MG/24HR TD PT24: 21.0000 mg | MEDICATED_PATCH | Freq: Every day | TRANSDERMAL | Status: DC

## 2023-10-07 MED ORDER — LORAZEPAM 2 MG/ML IJ SOLN: 2.0000 mg | Freq: Three times a day (TID) | INTRAMUSCULAR | Status: DC | PRN

## 2023-10-07 MED ORDER — ATORVASTATIN CALCIUM 20 MG PO TABS
80.0000 mg | ORAL_TABLET | Freq: Every day | ORAL | Status: DC
  Filled 2023-10-07: qty 4

## 2023-10-07 MED ORDER — APIXABAN 5 MG PO TABS
5.0000 mg | ORAL_TABLET | Freq: Two times a day (BID) | ORAL | Status: DC
  Administered 2023-10-07 – 2023-10-11 (×8): 5 mg via ORAL
  Filled 2023-10-07 (×10): qty 1

## 2023-10-07 MED ORDER — ALUM & MAG HYDROXIDE-SIMETH 200-200-20 MG/5ML PO SUSP
30.0000 mL | ORAL | Status: DC | PRN
  Administered 2023-10-08: 30 mL via ORAL
  Filled 2023-10-07: qty 30

## 2023-10-07 MED ORDER — ACETAMINOPHEN 325 MG PO TABS: 650.0000 mg | ORAL_TABLET | Freq: Four times a day (QID) | ORAL | Status: DC | PRN

## 2023-10-07 MED ORDER — MAGNESIUM HYDROXIDE 400 MG/5ML PO SUSP: 30.0000 mL | Freq: Every day | ORAL | Status: DC | PRN

## 2023-10-07 MED ORDER — HALOPERIDOL LACTATE 5 MG/ML IJ SOLN: 10.0000 mg | Freq: Three times a day (TID) | INTRAMUSCULAR | Status: DC | PRN

## 2023-10-07 MED ORDER — DIPHENHYDRAMINE HCL 50 MG/ML IJ SOLN: 50.0000 mg | Freq: Three times a day (TID) | INTRAMUSCULAR | Status: DC | PRN

## 2023-10-07 MED ORDER — HALOPERIDOL LACTATE 5 MG/ML IJ SOLN: 5.0000 mg | Freq: Three times a day (TID) | INTRAMUSCULAR | Status: DC | PRN

## 2023-10-07 MED ORDER — DULOXETINE HCL 30 MG PO CPEP
60.0000 mg | ORAL_CAPSULE | Freq: Every day | ORAL | Status: DC
  Filled 2023-10-07: qty 2

## 2023-10-07 MED ORDER — DIPHENHYDRAMINE HCL 25 MG PO CAPS: 50.0000 mg | ORAL_CAPSULE | Freq: Three times a day (TID) | ORAL | Status: DC | PRN

## 2023-10-07 MED ORDER — HALOPERIDOL 5 MG PO TABS: 5.0000 mg | ORAL_TABLET | Freq: Three times a day (TID) | ORAL | Status: DC | PRN

## 2023-10-07 MED ORDER — METOPROLOL SUCCINATE ER 25 MG PO TB24
25.0000 mg | ORAL_TABLET | Freq: Every day | ORAL | Status: DC
  Administered 2023-10-09 – 2023-10-11 (×3): 25 mg via ORAL
  Filled 2023-10-07 (×4): qty 1

## 2023-10-07 NOTE — Group Note (Signed)
 Date:  10/07/2023 Time:  9:23 PM  Group Topic/Focus:  Wrap-Up Group:   The focus of this group is to help patients review their daily goal of treatment and discuss progress on daily workbooks.    Participation Level:  Did Not Attend  Participation Quality:   none  Affect:   none  Cognitive:   none  Insight: None  Engagement in Group:   none  Modes of Intervention:   none  Additional Comments:  none   Belva Crome 10/07/2023, 9:23 PM

## 2023-10-07 NOTE — ED Notes (Signed)
 This tech obtained vital signs on pt.

## 2023-10-07 NOTE — ED Notes (Signed)
 Pt's BP continues to be elevated, refuses HTN medications

## 2023-10-07 NOTE — ED Notes (Signed)
 Staff has attempted several times to educate pt on medication compliance.  Pt continues to only be compliant with his Eliquis 5mg  BID. He stated that he does not take: Metoprolol Succ. 25mg  Daily, Cymbalta DR 60mg  daily, or the Lipitor 80mg  daily. David Shields stated that he also has never been a smoker.  Nicotine patch dc'ed.  On call EDP notified of non-medical med compliance. Cont to monitor as ordered

## 2023-10-07 NOTE — BH Assessment (Addendum)
 Patient is to be admitted to [facility] BMU tonight 10/07/23 after 7:30pm by Dr.  Irwin Brakeman .  Attending Physician will be Dr.  Irwin Brakeman .   Patient has been assigned to room 320, by [facility] Charge Nurse Danika.    ER staff is aware of the admission: Misty Stanley, ER Secretary   Dr. Cyril Loosen, ER MD  Florentina Addison, Patient's Nurse  Dorathy Daft, Patient Access.

## 2023-10-07 NOTE — Tx Team (Signed)
 Initial Treatment Plan 10/07/2023 9:26 PM LYNX GOODRICH JWJ:[id number]    PATIENT STRESSORS: Marital or family conflict   Medication change or noncompliance     PATIENT STRENGTHS: Ability for insight  Supportive family/friends    PATIENT IDENTIFIED PROBLEMS: Depression  SI  Anxiety                 DISCHARGE CRITERIA:  Improved stabilization in mood, thinking, and/or behavior Motivation to continue treatment in a less acute level of care  PRELIMINARY DISCHARGE PLAN: Outpatient therapy Return to previous living arrangement  PATIENT/FAMILY INVOLVEMENT: This treatment plan has been presented to and reviewed with the patient, David Shields. The patient has been given the opportunity to ask questions and make suggestions.  Elmyra Ricks, RN 10/07/2023, 9:26 PM

## 2023-10-07 NOTE — Progress Notes (Signed)
 Pt admitted from [facility], report received form Selena Batten, Charity fundraiser. Pt calm and pleasant during assessment. Pt stated he sent a video to his wife where he said he was going to kill himself and he had a gun in the video. His wife called the cops and they brought him in here. Pt is IVC'D. Pt stated when he made the video he was in the head space to hurt himself, but he currently denies SI/HI/AVH. Pt verbally contracts for safety with this Clinical research associate. Pt oriented to the unit and his room. Skin assessment completed with Raycha, RN, no abnormalities found, or contraband. Pt compliant with medication administration per MD orders. Pt given education, support, and encouragement to be active in his treatment plan. Pt being monitored Q 15 minutes for safety per unit protocol, remains safe on the unit

## 2023-10-07 NOTE — ED Notes (Signed)
 IVC Pt to be admitted tonight (10/07/23) to [facility] BMU after 7:30 , Rm is assigned

## 2023-10-07 NOTE — ED Notes (Signed)
 Report given to [facility] RN Matt.  Patient calm and cooperative. Patient denies SI/HI/AVH.  Patient informed of plan of transferring to BMU. Patient agreeable with plan.

## 2023-10-07 NOTE — Plan of Care (Signed)
 Pt new to the unit tonight, hasn't had time to progress  Problem: Education: Goal: Knowledge of [place] [facility] Education information/materials will improve Outcome: Not Progressing Goal: Emotional status will improve Outcome: Not Progressing Goal: Mental status will improve Outcome: Not Progressing Goal: Verbalization of understanding the information provided will improve Outcome: Not Progressing   Problem: Activity: Goal: Interest or engagement in activities will improve Outcome: Not Progressing Goal: Sleeping patterns will improve Outcome: Not Progressing   Problem: Coping: Goal: Ability to verbalize frustrations and anger appropriately will improve Outcome: Not Progressing Goal: Ability to demonstrate self-control will improve Outcome: Not Progressing   Problem: Health Behavior/Discharge Planning: Goal: Identification of resources available to assist in meeting health care needs will improve Outcome: Not Progressing Goal: Compliance with treatment plan for underlying cause of condition will improve Outcome: Not Progressing   Problem: Physical Regulation: Goal: Ability to maintain clinical measurements within normal limits will improve Outcome: Not Progressing   Problem: Safety: Goal: Periods of time without injury will increase Outcome: Not Progressing

## 2023-10-08 DIAGNOSIS — F332 Major depressive disorder, recurrent severe without psychotic features: Principal | ICD-10-CM

## 2023-10-08 LAB — LIPID PANEL
Cholesterol: 226 mg/dL — ABNORMAL HIGH (ref 0–200)
HDL: 41 mg/dL (ref >40)
LDL Cholesterol: 169 mg/dL — ABNORMAL HIGH (ref 0–99)
Total CHOL/HDL Ratio: 5.5 ratio
Triglycerides: 82 mg/dL (ref <150)
VLDL: 16 mg/dL (ref 0–40)

## 2023-10-08 LAB — TSH: TSH: 1.204 u[IU]/mL (ref 0.350–4.500)

## 2023-10-08 LAB — HIV ANTIBODY (ROUTINE TESTING W REFLEX): HIV Screen 4th Generation wRfx: NONREACTIVE

## 2023-10-08 LAB — HEMOGLOBIN A1C
Hgb A1c MFr Bld: 5.6 % (ref 4.8–5.6)
Mean Plasma Glucose: 114 mg/dL

## 2023-10-08 MED ORDER — ATORVASTATIN CALCIUM 20 MG PO TABS
80.0000 mg | ORAL_TABLET | Freq: Every day | ORAL | Status: DC
  Administered 2023-10-08 – 2023-10-10 (×3): 80 mg via ORAL
  Filled 2023-10-08 (×3): qty 4

## 2023-10-08 MED ORDER — SERTRALINE HCL 25 MG PO TABS
50.0000 mg | ORAL_TABLET | Freq: Every day | ORAL | Status: DC
  Administered 2023-10-09 – 2023-10-11 (×3): 50 mg via ORAL
  Filled 2023-10-08 (×4): qty 2

## 2023-10-08 MED ORDER — TRAZODONE HCL 50 MG PO TABS: 50.0000 mg | ORAL_TABLET | Freq: Every evening | ORAL | Status: DC | PRN

## 2023-10-08 MED ORDER — HYDROXYZINE HCL 25 MG PO TABS: 25.0000 mg | ORAL_TABLET | Freq: Four times a day (QID) | ORAL | Status: DC | PRN

## 2023-10-08 NOTE — Progress Notes (Addendum)
  Pt pleasant and cooperative on approach, he denied SI/HI, plan or intent. Pt presented sad and depressed, declined to take medications except for his Eliqus , stating his [place] doctor have a different regimen for him. Provider notify of patient decision.      10/08/23 1400  Psych Admission Type (Psych Patients Only)  Admission Status Involuntary  Psychosocial Assessment  Patient Complaints None  Eye Contact Fair  Facial Expression Sad  Affect Depressed  Speech Logical/coherent  Interaction Guarded  Motor Activity Slow  Appearance/Hygiene In scrubs  Behavior Characteristics Cooperative  Mood Depressed  Thought Process  Coherency WDL  Content WDL  Delusions None reported or observed  Perception WDL  Hallucination None reported or observed  Judgment Impaired  Confusion None  Danger to Self  Current suicidal ideation? Denies  Agreement Not to Harm Self Yes  Description of Agreement verbal  Danger to Others  Danger to Others None reported or observed

## 2023-10-08 NOTE — Group Note (Signed)
 Date:  10/08/2023 Time:  10:15 AM  Group Topic/Focus:  Goals Group:   The focus of this group is to help patients establish daily goals to achieve during treatment and discuss how the patient can incorporate goal setting into their daily lives to aide in recovery.    Participation Level:  Active  Participation Quality:  Appropriate  Affect:  Appropriate  Cognitive:  Appropriate  Insight: Appropriate  Engagement in Group:  Engaged  Modes of Intervention:  Discussion, Education, and Support  Additional Comments:    Wilford Corner 10/08/2023, 10:15 AM

## 2023-10-08 NOTE — [facility] Counselor (Signed)
 Adult Comprehensive Assessment  Patient ID: LETCHER SCHWEIKERT, male   DOB: [birth year], 33 y.o.   MRN: [id number]  Information Source: Information source: Patient  Current Stressors:  Patient states their primary concerns and needs for treatment are:: "I sent my wife a video saying that I was going to harm myself. I have a lot of thoughts going through my mind." Patient states their goals for this hospitilization and ongoing recovery are:: "To figure out what happened and to learn how to cope." Educational / Learning stressors: "A little bit but not really." Employment / Job issues: "I loss my job in November due to budget cuts and it's been hard to find a job." Family Relationships: Patient denies. Financial / Lack of resources (include bankruptcy): "It's so much coming up. The kid's birthdays and vacations." Housing / Lack of housing: Patient denies. Physical health (include injuries & life threatening diseases): "I had a stroke March 17th, 2023." Social relationships: Patient denies. Substance abuse: Patient denies. Bereavement / Loss: Patient denies.  Living/Environment/Situation:  Living Arrangements: Spouse/significant other, Children Living conditions (as described by patient or guardian): WNL Who else lives in the home?: Patient reports that he lives in the home with his wife and children. How long has patient lived in current situation?: "We just got the house in October." What is atmosphere in current home: Comfortable, Paramedic, Chaotic  Family History:  Marital status: Married Number of Years Married: 3 What types of issues is patient dealing with in the relationship?: Patient denies. Are you sexually active?: Yes What is your sexual orientation?: "Heterosexual." Has your sexual activity been affected by drugs, alcohol, medication, or emotional stress?: "Not in the mood." Does patient have children?: Yes How many children?: 4 How is patient's relationship with their children?:  "Good. I have one that I get every other weekend from a previous relationship."  Childhood History:  By whom was/is the patient raised?: Mother, Grandparents Additional childhood history information: Patient reports that his father was not in his life. Description of patient's relationship with caregiver when they were a child: "My grandma was good but my momma it was sticky." Patient's description of current relationship with people who raised him/her: "It's better with my mom now." How were you disciplined when you got in trouble as a child/adolescent?: "I got whoopings every once in awhile." Does patient have siblings?: No ("I have 17 on my father's side.") Did patient suffer any verbal/emotional/physical/sexual abuse as a child?: No Did patient suffer from severe childhood neglect?: Yes Patient description of severe childhood neglect: "Sometimes. It wasn't an all the time thing." Has patient ever been sexually abused/assaulted/raped as an adolescent or adult?: No Was the patient ever a victim of a crime or a disaster?: No Witnessed domestic violence?: Yes Has patient been affected by domestic violence as an adult?: No Description of domestic violence: "It was outside and I was around."  Education:  Highest grade of school patient has completed: "Some college." Currently a student?: Yes Name of school: "I'm at [employer] and transferring to [employer]." How long has the patient attended?: "About 1 year." Learning disability?: No  Employment/Work Situation:   Employment Situation: On disability 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  Financial Resources:   Financial resources: Insurance claims handler  Alcohol/Substance Abuse:   What has been your use of drugs/alcohol within the last 12 months?: "Just socially." If attempted suicide, did drugs/alcohol play a role in this?: No Alcohol/Substance Abuse Treatment Hx: Denies past history Has alcohol/substance abuse ever caused legal problems?: No  Social Support System:   Patient's Community Support System: Good Describe Community Support System: "My wife, my mom and aunt." Type of faith/religion: Patient denies. How does patient's faith help to cope with current illness?: Patient denies.  Leisure/Recreation:   Do You Have Hobbies?: Yes Leisure and Hobbies: Architectural technologist."  Strengths/Needs:   What is the patient's perception of their strengths?: "Kind hearted." Patient states they can use these personal strengths during their treatment to contribute to their recovery: "i don't know right now." Patient states these barriers may affect/interfere with their treatment: Patient denies. Patient states these barriers may affect their return to the community: Patient denies. Other important information patient would like considered in planning for their treatment: Patient reports that he is interested in outpatient therapy.  Discharge Plan:   Currently receiving community mental health services: No Patient states concerns and preferences for aftercare planning are: None reported. Patient states they will know when they are safe and ready for discharge when: "I feel like I'm ok but I had a moment." Does patient have access to transportation?: Yes (Patient reports a family member.) Does patient have financial barriers related to discharge medications?: No Patient description of barriers related to discharge medications: None reported. Will patient be returning to same  living situation after discharge?: Yes  Summary/Recommendations:   Summary and Recommendations (to be completed by the evaluator): Patient is a 33 years old black English-speaking male who is under IVC. Per triage note, patient came to the ED with the [facility]. Patient reports that he "sent my wife a video saying that I was going to harm myself. I have a lot of thoughts going through my mind." Patient was found in a parking lot with the gun still in his possession. Patient endorsed employment and financial stressors reporting that he is currently unemployed. Patient reports that he was let go from his job previous job in November due to budget cuts. Patient reports difficulty finding employment since. Patient reports that he is "overwhelmed" as there are "birthdays and vacations" planned that are a financial toll. Patient is currently a Consulting civil engineer at [employer] and repots that he will be transferring to [employer] soon. Patient currently resides with his wife and 3 children with a fourth child that he gets custody of every other weekend. 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 Patient reports adequate support from his wife, mother and aunt. Patient reports having a stroke March 17th, 2023 but no other physical ailments at this time. Pt denied substance use reporting social alcohol use. Patient denies current SI, HI, and AV/H. Patient is not currently receiving outpatient therapy but would like a referral prior to discharge. Recommendations include: crisis stabilization, therapeutic milieu, encourage group attendance and participation, medication management for mood stabilization and development of comprehensive mental wellness/sobriety plan.  Lowry Ram. 10/08/2023

## 2023-10-08 NOTE — BH IP Treatment Plan (Signed)
 Interdisciplinary Treatment and Diagnostic Plan Update  10/08/2023 Time of Session: 10:10 David Shields MRN: [id number]  Principal Diagnosis: MDD (major depressive disorder), recurrent episode, severe (HCC)  Secondary Diagnoses: Principal Problem:   MDD (major depressive disorder), recurrent episode, severe (HCC)   Current Medications:  Current Facility-Administered Medications  Medication Dose Route Frequency Provider Last Rate Last Admin   acetaminophen (TYLENOL) tablet 650 mg  650 mg Oral Q6H PRN Chales Abrahams, NP       alum & mag hydroxide-simeth (MAALOX/MYLANTA) 200-200-20 MG/5ML suspension 30 mL  30 mL Oral Q4H PRN Chales Abrahams, NP       apixaban Everlene Balls) tablet 5 mg  5 mg Oral BID Ophelia Shoulder E, NP   5 mg at 10/08/23 0835   atorvastatin (LIPITOR) tablet 80 mg  80 mg Oral Daily Ophelia Shoulder E, NP       haloperidol (HALDOL) tablet 5 mg  5 mg Oral TID PRN Chales Abrahams, NP       And   diphenhydrAMINE (BENADRYL) capsule 50 mg  50 mg Oral TID PRN Ophelia Shoulder E, NP       haloperidol lactate (HALDOL) injection 5 mg  5 mg Intramuscular TID PRN Chales Abrahams, NP       And   diphenhydrAMINE (BENADRYL) injection 50 mg  50 mg Intramuscular TID PRN Chales Abrahams, NP       And   LORazepam (ATIVAN) injection 2 mg  2 mg Intramuscular TID PRN Ophelia Shoulder E, NP       haloperidol lactate (HALDOL) injection 10 mg  10 mg Intramuscular TID PRN Chales Abrahams, NP       And   diphenhydrAMINE (BENADRYL) injection 50 mg  50 mg Intramuscular TID PRN Chales Abrahams, NP       And   LORazepam (ATIVAN) injection 2 mg  2 mg Intramuscular TID PRN Chales Abrahams, NP       DULoxetine (CYMBALTA) DR capsule 60 mg  60 mg Oral Daily Ophelia Shoulder E, NP       magnesium hydroxide (MILK OF MAGNESIA) suspension 30 mL  30 mL Oral Daily PRN Chales Abrahams, NP       metoprolol succinate (TOPROL-XL) 24 hr tablet 25 mg  25 mg Oral Daily Ophelia Shoulder E, NP       PTA Medications: Medications Prior  to Admission  Medication Sig Dispense Refill Last Dose/Taking   apixaban (ELIQUIS) 5 MG TABS tablet Take 1 tablet (5 mg total) by mouth 2 (two) times daily. 90 tablet 3    atorvastatin (LIPITOR) 80 MG tablet Take 1 tablet (80 mg total) by mouth daily. 30 tablet 3    DULoxetine (CYMBALTA) 30 MG capsule Take 60 mg by mouth daily.      metoprolol succinate (TOPROL-XL) 50 MG 24 hr tablet Take 0.5 tablets by mouth daily.      nicotine (NICODERM CQ - DOSED IN MG/24 HOURS) 21 mg/24hr patch Place 1 patch (21 mg total) onto the skin daily. 28 patch 0     Patient Stressors: Marital or family conflict   Medication change or noncompliance    Patient Strengths: Ability for insight  Supportive family/friends   Treatment Modalities: Medication Management, Group therapy, Case management,  1 to 1 session with clinician, Psychoeducation, Recreational therapy.   Physician Treatment Plan for Primary Diagnosis: MDD (major depressive disorder), recurrent episode, severe (HCC) Long Term Goal(s):     Short Term Goals:  Medication Management: Evaluate patient's response, side effects, and tolerance of medication regimen.  Therapeutic Interventions: 1 to 1 sessions, Unit Group sessions and Medication administration.  Evaluation of Outcomes: Not Met  Physician Treatment Plan for Secondary Diagnosis: Principal Problem:   MDD (major depressive disorder), recurrent episode, severe (HCC)  Long Term Goal(s):     Short Term Goals:       Medication Management: Evaluate patient's response, side effects, and tolerance of medication regimen.  Therapeutic Interventions: 1 to 1 sessions, Unit Group sessions and Medication administration.  Evaluation of Outcomes: Not Met   RN Treatment Plan for Primary Diagnosis: MDD (major depressive disorder), recurrent episode, severe (HCC) Long Term Goal(s): Knowledge of disease and therapeutic regimen to maintain health will improve  Short Term Goals: Ability to remain  free from injury will improve, Ability to verbalize frustration and anger appropriately will improve, Ability to demonstrate self-control, Ability to participate in decision making will improve, Ability to verbalize feelings will improve, Ability to disclose and discuss suicidal ideas, Ability to identify and develop effective coping behaviors will improve, and Compliance with prescribed medications will improve  Medication Management: RN will administer medications as ordered by provider, will assess and evaluate patient's response and provide education to patient for prescribed medication. RN will report any adverse and/or side effects to prescribing provider.  Therapeutic Interventions: 1 on 1 counseling sessions, Psychoeducation, Medication administration, Evaluate responses to treatment, Monitor vital signs and CBGs as ordered, Perform/monitor CIWA, COWS, AIMS and Fall Risk screenings as ordered, Perform wound care treatments as ordered.  Evaluation of Outcomes: Not Met   LCSW Treatment Plan for Primary Diagnosis: MDD (major depressive disorder), recurrent episode, severe (HCC) Long Term Goal(s): Safe transition to appropriate next level of care at discharge, Engage patient in therapeutic group addressing interpersonal concerns.  Short Term Goals: Engage patient in aftercare planning with referrals and resources, Increase social support, Increase ability to appropriately verbalize feelings, Increase emotional regulation, Facilitate acceptance of mental health diagnosis and concerns, Identify triggers associated with mental health/substance abuse issues, and Increase skills for wellness and recovery  Therapeutic Interventions: Assess for all discharge needs, 1 to 1 time with Social worker, Explore available resources and support systems, Assess for adequacy in community support network, Educate family and significant other(s) on suicide prevention, Complete Psychosocial Assessment, Interpersonal  group therapy.  Evaluation of Outcomes: Not Met   Progress in Treatment: Attending groups: Yes. Participating in groups: Yes. Taking medication as prescribed: Yes. Toleration medication: Yes. Family/Significant other contact made: No, will contact:  when given permission.  Patient understands diagnosis: Yes. Discussing patient identified problems/goals with staff: Yes. Medical problems stabilized or resolved: Yes. Denies suicidal/homicidal ideation: Yes. Issues/concerns per patient self-inventory: No. Other: none.  New problem(s) identified: No, Describe:  none identified.   New Short Term/Long Term Goal(s): medication management for mood stabilization; elimination of SI thoughts; development of comprehensive mental wellness plan.   Patient Goals: "Just figure out ways to cope."    Discharge Plan or Barriers: CSW will assist pt with development of an appropriate aftercare/discharge plan.   Reason for Continuation of Hospitalization: Anxiety Depression Medication stabilization Suicidal ideation  Estimated Length of Stay: 1-7 days  Last 3 [place] Suicide Severity Risk Score: Flowsheet Row Admission (Current) from 10/07/2023 in [facility] INPAT[facility] MEDICINE ED from 10/06/2023 in [facility] Emergency Department at [facility] ED from 01/18/2023 in [facility] Emergency Department at [facility]  C-SSRS RISK CATEGORY High Risk High Risk No Risk  Last PHQ 2/9 Scores:     No data to display          Scribe for Treatment Team: Glenis Smoker, LCSW 10/08/2023 11:12 AM

## 2023-10-08 NOTE — Group Note (Signed)
 [facility] LCSW Group Therapy Note   Group Date: 10/08/2023 Start Time: 1310 End Time: 1400   Type of Therapy/Topic:  Group Therapy:  Emotion Regulation  Participation Level:  Did Not Attend    Description of Group:    The purpose of this group is to assist patients in learning to regulate negative emotions and experience positive emotions. Patients will be guided to discuss ways in which they have been vulnerable to their negative emotions. These vulnerabilities will be juxtaposed with experiences of positive emotions or situations, and patients challenged to use positive emotions to combat negative ones. Special emphasis will be placed on coping with negative emotions in conflict situations, and patients will process healthy conflict resolution skills.  Therapeutic Goals: Patient will identify two positive emotions or experiences to reflect on in order to balance out negative emotions:  Patient will label two or more emotions that they find the most difficult to experience:  Patient will be able to demonstrate positive conflict resolution skills through discussion or role plays:   Summary of Patient Progress: X   Therapeutic Modalities:   Cognitive Behavioral Therapy Feelings Identification Dialectical Behavioral Therapy   Glenis Smoker, LCSW

## 2023-10-08 NOTE — Group Note (Deleted)
 Date:  10/08/2023 Time:  10:21 AM  Group Topic/Focus:  Goals Group:   The focus of this group is to help patients establish daily goals to achieve during treatment and discuss how the patient can incorporate goal setting into their daily lives to aide in recovery. Healthy Communication:   The focus of this group is to discuss communication, barriers to communication, as well as healthy ways to communicate with others.    Participation Level:  Did Not Attend   Lynelle Smoke [facility] 10/08/2023, 10:21 AM

## 2023-10-08 NOTE — Group Note (Signed)
 Date:  10/08/2023 Time:  4:29 PM  Group Topic/Focus:  Wellness Toolbox:   The focus of this group is to discuss various aspects of wellness, balancing those aspects and exploring ways to increase the ability to experience wellness.  Patients will create a wellness toolbox for use upon discharge.    Participation Level:  Active  Participation Quality:  Appropriate  Affect:  Appropriate  Cognitive:  Appropriate  Insight: Appropriate  Engagement in Group:  Engaged  Modes of Intervention:  Activity  Additional Comments:    Wilford Corner 10/08/2023, 4:29 PM

## 2023-10-08 NOTE — Plan of Care (Signed)
  Problem: Education: Goal: Knowledge of [place] [facility] Education information/materials will improve Outcome: Progressing Goal: Emotional status will improve Outcome: Progressing Goal: Mental status will improve Outcome: Progressing Goal: Verbalization of understanding the information provided will improve Outcome: Progressing   Problem: Activity: Goal: Interest or engagement in activities will improve Outcome: Progressing Goal: Sleeping patterns will improve Outcome: Progressing   Problem: Coping: Goal: Ability to verbalize frustrations and anger appropriately will improve Outcome: Progressing Goal: Ability to demonstrate self-control will improve Outcome: Progressing   Problem: Safety: Goal: Periods of time without injury will increase Outcome: Progressing

## 2023-10-08 NOTE — H&P (Signed)
 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  After pt reportedly sent a video to his wife making suicidal statements while holding a firearm to his head.  Patient's wife reported him as missing, and he was found in his car with send firearm.  UDS negative  Patient is seen for evaluation today, appears to be guarded, depressed, anxious, somewhat irritable.  He does confirm allegations above as to why he was admitted to the hospital.  States that law enforcement did confiscate his firearm when they found him.  He reports that he was experiencing suicidal ideation only that day, and that he is unsure of what his plan was.  He denies any triggering events occurring on that day.  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  States that after that he was on unemployment however this was discontinued in February 2025.  He cites multiple life stressors including difficulties finding a job, multiple upcoming birthdays for which he feels obligated to assist with.  States he only sleeps about 5 to 6 hours at night however this is normal for him.  He is endorsing symptoms of depressed mood, worthlessness, hopelessness, reduced  appetite. Reports that he has always been depressed, has been diagnosed with depression, anxiety and insomnia.  Trialed on psychotropic medication as a young adult, however he discontinued taking it, cannot recall what this medication was.  States that recently he was started on duloxetine however he discontinued it because he was experiencing nausea and lightheadedness.  Relaying that he has an upcoming appointment with a psychiatric provider at [facility] on May 7.  Denies he is currently followed by an outpatient mental health provider, no therapy services.  Denies any prior inpatient psych hospitalizations, suicide attempts.  Denies any history of mania/hypomania, trauma, psychosis.  Was in the [employer] for 4 years, honorable discharge in 2021 from [employer], history of being deployed to [place].  Currently receives 90% disability benefits.  Currently in college.  Lives at home with his wife, 3 stepchildren ages 59, 31 and 6, and his biological son who is 48 years old.  Denies having any spiritual history.  Denies any past or current legal charges.  Denies tobacco, illicit substance use.  Reports occasional alcohol use.  Cholesterol 226, LDL 169 TSH WNL   Associated Signs/Symptoms: Depression Symptoms:  depressed mood, insomnia, hopelessness, suicidal thoughts with specific plan, anxiety, disturbed sleep, decreased appetite, (Hypo) Manic Symptoms:  Impulsivity, Anxiety Symptoms:  Excessive Worry, Psychotic Symptoms:   none PTSD Symptoms: NA Total Time spent with patient: 1.5 hours  Past Psychiatric History:  DX: Depression, anxiety, insomnia Outpatient provider: None currently Current caregiver:  Patient is own guardian/ care giver Past hospitalizations: Patient denies Medication trials : Patient can only recall Cymbalta (discontinued taking due to nausea) Suicide attempts: Patient denies Patient denies ever having  an Act/CST team. Denies ECT, Clozaril treatments. Patient does  have access to firearms, recently confiscated by law enforcement  Is the patient at risk to self? Yes.    Has the patient been a risk to self in the past 6 months? Yes.    Has the patient been a risk to self within the distant past? No.  Is the patient a risk to others? No.  Has the patient been a risk to others in the past 6 months? No.  Has the patient been a risk to others within the distant past? No.   [place] Scale:  Flowsheet Row Admission (Current) from 10/07/2023 in [facility] INPATIENT BEH[facility]AL MEDICINE ED from 10/06/2023 in [facility] Emergency Department at [facility] ED from 01/18/2023 in [facility] Emergency Department at [facility]  C-SSRS RISK CATEGORY High Risk High Risk No Risk        Prior Inpatient Therapy: No. If yes, describe patient denies Prior Outpatient Therapy: Yes.   If yes, describe [facility]  Alcohol Screening: 1. How often do you have a drink containing alcohol?: 2 to 4 times a month 2. How many drinks containing alcohol do you have on a typical day when you are drinking?: 3 or 4 3. How often do you have six or more drinks on one occasion?: Less than monthly AUDIT-C Score: 4 4. How often during the last year have you found that you were not able to stop drinking once you had started?: Never 5. How often during the last year have you failed to do what was normally expected from you because of drinking?: Never 6. How often during the last year have you needed a first drink in the morning to get yourself going after a heavy drinking session?: Never 7. How often during the last year have you had a feeling of guilt of remorse after drinking?: Never 8. How often during the last year have you been unable to remember what happened the night before because you had been drinking?: Never 9. Have you or someone else been injured as a result of your drinking?: No 10. Has a relative or friend or a doctor or another health worker been concerned about your  drinking or suggested you cut down?: No Alcohol Use Disorder Identification Test Final Score (AUDIT): 4 Alcohol Brief Interventions/Follow-up: Alcohol education/Brief advice Substance Abuse History in the last 12 months:  No. Consequences of Substance Abuse: NA Previous Psychotropic Medications: Yes  Psychological Evaluations: No  Past Medical History:  Past Medical History:  Diagnosis Date   Anxiety    PAF (paroxysmal atrial fibrillation) (HCC)    Severe left ventricular hypertrophy    Stroke (cerebrum) (HCC)    Vertebral artery dissection (HCC)     Past Surgical History:  Procedure Laterality Date   ADENOIDECTOMY     IR ANGIO INTRA EXTRACRAN SEL COM CAROTID INNOMINATE BILAT MOD SED  09/15/2021   IR ANGIO VERTEBRAL SEL VERTEBRAL UNI L MOD SED  09/15/2021   IR ANGIOGRAM FOLLOW UP STUDY  09/15/2021   IR ANGIOGRAM FOLLOW UP STUDY  09/15/2021   IR ANGIOGRAM FOLLOW UP STUDY  09/15/2021   IR ANGIOGRAM FOLLOW UP STUDY  09/15/2021   IR ANGIOGRAM FOLLOW UP STUDY  09/15/2021   IR ANGIOGRAM FOLLOW UP STUDY  09/15/2021   IR ANGIOGRAM FOLLOW UP STUDY  09/15/2021   IR ANGIOGRAM FOLLOW UP STUDY  09/15/2021   IR ANGIOGRAM FOLLOW UP STUDY  09/15/2021   IR ANGIOGRAM FOLLOW UP STUDY  09/15/2021  IR ANGIOGRAM FOLLOW UP STUDY  09/15/2021   IR ANGIOGRAM FOLLOW UP STUDY  09/15/2021   IR CT HEAD LTD  09/15/2021   IR PERCUTANEOUS ART THROMBECTOMY/INFUSION INTRACRANIAL INC DIAG ANGIO  09/15/2021   IR TRANSCATH/EMBOLIZ  09/15/2021   RADIOLOGY WITH ANESTHESIA N/A 09/15/2021   Procedure: IR WITH ANESTHESIA;  Surgeon: Julieanne Cotton, MD;  Location: [facility] OR;  Service: Radiology;  Laterality: N/A;   Family History:  Family History  Problem Relation Age of Onset   Depression Mother    Cancer Paternal Uncle    Cancer Maternal Grandmother        breast    Hypertension Maternal Grandmother    Family Psychiatric  History:  Mental illness: Mother-anxiety Suicide: Denies Substance use disorder: Denies Tobacco  Screening:  Social History   Tobacco Use  Smoking Status Some Days   Current packs/day: 0.50   Types: Cigarettes, Cigars  Smokeless Tobacco Never    [facility] Tobacco Counseling     Are you interested in Tobacco Cessation Medications?  No, patient refused Counseled patient on smoking cessation:  Refused/Declined practical counseling Reason Tobacco Screening Not Completed: No value filed.       Social History:  Social History   Substance and Sexual Activity  Alcohol Use Yes   Comment: occasionally     Social History   Substance and Sexual Activity  Drug Use No    Additional Social History: Marital status: Married Number of Years Married: 3 What types of issues is patient dealing with in the relationship?: Patient denies. Are you sexually active?: Yes What is your sexual orientation?: "Heterosexual." Has your sexual activity been affected by drugs, alcohol, medication, or emotional stress?: "Not in the mood." Does patient have children?: Yes How many children?: 4 How is patient's relationship with their children?: "Good. I have one that I get every other weekend from a previous relationship."                         Allergies:   Allergies  Allergen Reactions   Banana Hives, Shortness Of Breath and Itching   Lab Results:  Results for orders placed or performed during the hospital encounter of 10/07/23 (from the past 48 hours)  Lipid panel     Status: Abnormal   Collection Time: 10/08/23  2:38 PM  Result Value Ref Range   Cholesterol 226 (H) 0 - 200 mg/dL   Triglycerides 82 <161 mg/dL   HDL 41 >09 mg/dL   Total CHOL/HDL Ratio 5.5 RATIO   VLDL 16 0 - 40 mg/dL   LDL Cholesterol 604 (H) 0 - 99 mg/dL    Comment:        Total Cholesterol/HDL:CHD Risk Coronary Heart Disease Risk Table                     Men   Women  1/2 Average Risk   3.4   3.3  Average Risk       5.0   4.4  2 X Average Risk   9.6   7.1  3 X Average Risk  23.4   11.0        Use the  calculated Patient Ratio above and the CHD Risk Table to determine the patient's CHD Risk.        ATP III CLASSIFICATION (LDL):  <100     mg/dL   Optimal  540-981  mg/dL   Near or Above  Optimal  130-159  mg/dL   Borderline  161-096  mg/dL   High  >045     mg/dL   Very High Performed at [facility], [address] [place], [place] [address]   TSH     Status: None   Collection Time: 10/08/23  2:38 PM  Result Value Ref Range   TSH 1.204 0.350 - 4.500 uIU/mL    Comment: Performed by a 3rd Generation assay with a functional sensitivity of <=0.01 uIU/mL. Performed at [facility] [address] [place], [place] [address]     Blood Alcohol level:  Lab Results  Component Value Date   [facility] <10 10/06/2023   ETH <10 01/13/2023    Metabolic Disorder Labs:  Lab Results  Component Value Date   HGBA1C 5.4 01/13/2023   MPG 108.28 01/13/2023   MPG 108.28 09/15/2021   No results found for: "PROLACTIN" Lab Results  Component Value Date   CHOL 226 (H) 10/08/2023   TRIG 82 10/08/2023   HDL 41 10/08/2023   CHOLHDL 5.5 10/08/2023   VLDL 16 10/08/2023   LDLCALC 169 (H) 10/08/2023   LDLCALC 155 (H) 01/14/2023    Current Medications: Current Facility-Administered Medications  Medication Dose Route Frequency Provider Last Rate Last Admin   acetaminophen (TYLENOL) tablet 650 mg  650 mg Oral Q6H PRN Saahas Hidrogo, PA-C       alum & mag hydroxide-simeth (MAALOX/MYLANTA) 200-200-20 MG/5ML suspension 30 mL  30 mL Oral Q4H PRN Apolonio Cutting, PA-C       apixaban (ELIQUIS) tablet 5 mg  5 mg Oral BID Cody Albus, PA-C   5 mg at 10/08/23 1729   atorvastatin (LIPITOR) tablet 80 mg  80 mg Oral QHS Deja Kaigler, PA-C       haloperidol (HALDOL) tablet 5 mg  5 mg Oral TID PRN Carlitos Bottino, PA-C       And   diphenhydrAMINE (BENADRYL) capsule 50 mg  50 mg Oral TID PRN Eagle Pitta, PA-C       haloperidol lactate (HALDOL)  injection 5 mg  5 mg Intramuscular TID PRN Ronnesha Mester, PA-C       And   diphenhydrAMINE (BENADRYL) injection 50 mg  50 mg Intramuscular TID PRN Gennesis Hogland, PA-C       And   LORazepam (ATIVAN) injection 2 mg  2 mg Intramuscular TID PRN Jaci Desanto, PA-C       haloperidol lactate (HALDOL) injection 10 mg  10 mg Intramuscular TID PRN Kayani Rapaport, PA-C       And   diphenhydrAMINE (BENADRYL) injection 50 mg  50 mg Intramuscular TID PRN Noble Cicalese, PA-C       And   LORazepam (ATIVAN) injection 2 mg  2 mg Intramuscular TID PRN Marvis Saefong, PA-C       hydrOXYzine (ATARAX) tablet 25 mg  25 mg Oral Q6H PRN Emmalynne Courtney, PA-C       magnesium hydroxide (MILK OF MAGNESIA) suspension 30 mL  30 mL Oral Daily PRN Allayna Erlich, PA-C       metoprolol succinate (TOPROL-XL) 24 hr tablet 25 mg  25 mg Oral Daily Hajime Asfaw, PA-C       sertraline (ZOLOFT) tablet 50 mg  50 mg Oral Daily Jowanna Loeffler, PA-C       traZODone (DESYREL) tablet 50 mg  50 mg Oral QHS PRN Frederic Tones, PA-C       PTA Medications: Medications Prior to Admission  Medication Sig Dispense Refill Last  Dose/Taking   apixaban (ELIQUIS) 5 MG TABS tablet Take 1 tablet (5 mg total) by mouth 2 (two) times daily. 90 tablet 3    atorvastatin (LIPITOR) 80 MG tablet Take 1 tablet (80 mg total) by mouth daily. 30 tablet 3    DULoxetine (CYMBALTA) 30 MG capsule Take 60 mg by mouth daily.      metoprolol succinate (TOPROL-XL) 50 MG 24 hr tablet Take 0.5 tablets by mouth daily.      nicotine (NICODERM CQ - DOSED IN MG/24 HOURS) 21 mg/24hr patch Place 1 patch (21 mg total) onto the skin daily. 28 patch 0     Musculoskeletal: Strength & Muscle Tone: within normal limits Gait & Station: normal Patient leans: N/A            Psychiatric Specialty Exam:  Presentation  General Appearance:  Appropriate for Environment  Eye Contact: Fair  Speech: Normal  Rate  Speech Volume: Normal  Handedness:No data recorded  Mood and Affect  Mood: Irritable; Dysphoric; Anxious  Affect: Congruent; Depressed   Thought Process  Thought Processes: Coherent  Duration of Psychotic Symptoms:N/A Past Diagnosis of Schizophrenia or Psychoactive disorder: No  Descriptions of Associations:Intact  Orientation:Full (Time, Place and Person)  Thought Content:WDL  Hallucinations:Hallucinations: None  Ideas of Reference:None  Suicidal Thoughts:Suicidal Thoughts: No  Homicidal Thoughts:Homicidal Thoughts: No   Sensorium  Memory: Immediate Good; Recent Good; Remote Good  Judgment: Fair  Insight: Fair   Art therapist  Concentration: Good  Attention Span: Good  Recall: Good  Fund of Knowledge: Fair  Language: Fair   Psychomotor Activity  Psychomotor Activity: Psychomotor Activity: Normal   Assets  Assets: Manufacturing systems engineer; Housing; Intimacy; Physical Health; Social Support; Transportation   Sleep  Sleep: Sleep: Fair    Physical Exam: Physical Exam Vitals and nursing note reviewed.  Constitutional:      Appearance: Normal appearance.  HENT:     Head: Normocephalic and atraumatic.  Eyes:     Extraocular Movements: Extraocular movements intact.  Pulmonary:     Effort: Pulmonary effort is normal.  Musculoskeletal:        General: Normal range of motion.     Cervical back: Neck supple.  Skin:    General: Skin is dry.  Neurological:     Mental Status: He is alert and oriented to person, place, and time. Mental status is at baseline.  Psychiatric:        Attention and Perception: Attention and perception normal.        Mood and Affect: Mood is anxious and depressed. Affect is angry.        Speech: Speech normal.        Behavior: Behavior normal.        Thought Content: Thought content normal.        Cognition and Memory: Cognition and memory normal.        Judgment: Judgment is impulsive.     Review of Systems  Psychiatric/Behavioral:  Positive for depression. The patient is nervous/anxious and has insomnia.   All other systems reviewed and are negative.  Blood pressure (!) 128/96, pulse (!) 127, temperature 98.2 F (36.8 C), resp. rate 20, height 6\' 1"  (1.854 m), weight 99.8 kg, SpO2 95%. Body mass index is 29.03 kg/m.  Treatment Plan Summary: Daily contact with patient to assess and evaluate symptoms and progress in treatment and Medication management  Observation Level/Precautions:  15 minute checks  Laboratory:  HbAIC Lipid panel, RPR, TSH, HIV, EKG  Psychotherapy:  Medications: Zoloft  Consultations:    Discharge Concerns: Access to firearms  Estimated LOS: 3 to 5 days  Other:     Physician Treatment Plan for Primary Diagnosis: MDD (major depressive disorder), recurrent episode, severe (HCC)  1.    Safety and Monitoring:   --  Involuntary admission to inpatient psychiatric unit for safety, stabilization and treatment -- Daily contact with patient to assess and evaluate symptoms and progress in treatment -- Patient's case to be discussed in multi-disciplinary team meeting -- Observation Level : q15 minute checks -- Vital signs:  q12 hours -- Precautions: suicide   2. Psychiatric Diagnoses and Treatment:   -- Start Zoloft 50 mg daily for depressive symptoms -- Hydroxyzine 25 mg 3 times daily as needed for anxiety -- Trazodone 50 mg at bedtime as needed for insomnia  --  The risks/benefits/side-effects/alternatives to this medication were discussed in detail with the patient and time was given for questions. The patient consents to medication trial. -- Metabolic profile and EKG monitoring obtained while on an atypical antipsychotic  -- Encouraged patient to participate in unit milieu and in scheduled group therapies -- Short Term Goals: Ability to identify changes in lifestyle to reduce recurrence of condition will improve, Ability to verbalize feelings  will improve, Ability to disclose and discuss suicidal ideas, Ability to demonstrate self-control will improve, Ability to identify and develop effective coping behaviors will improve, Ability to maintain clinical measurements within normal limits will improve, Compliance with prescribed medications will improve, and Ability to identify triggers associated with substance abuse/mental health issues will improve -- Long Term Goals: Improvement in symptoms so as ready for discharge        3. Medical Issues Being Addressed:               History of CVA  -- Continue Eliquis 5 mg daily, metoprolol succinate 25 mg daily. Atorvastatin 80 mg HS    4. Discharge Planning:   -- Social work and case management to assist with discharge planning and identification of hospital follow-up needs prior to discharge -- Estimated LOS: 5-7 days -- Discharge Concerns: Need to establish a safety plan; Medication compliance and effectiveness -- Discharge Goals: Return home with outpatient referrals for mental health follow-up including medication management/psychotherapy    I certify that inpatient services furnished can reasonably be expected to improve the patient's condition.    Jaleigh Mccroskey, PA-C 4/9/20255:49 PM

## 2023-10-08 NOTE — [facility] Suicide Risk Assessment (Signed)
 Suicide Risk Assessment  Admission Assessment    [facility] Admission Suicide Risk Assessment   Nursing information obtained from:  Patient Demographic factors:  Male, Access to firearms Current Mental Status:  Suicidal ideation indicated by patient Loss Factors:  NA Historical Factors:  Impulsivity Risk Reduction Factors:  Positive social support, Positive therapeutic relationship, Living with another person, especially a relative  Total Time spent with patient: 1.5 hours Principal Problem: MDD (major depressive disorder), recurrent episode, severe (HCC) Diagnosis:  Principal Problem:   MDD (major depressive disorder), recurrent episode, severe (HCC)  Subjective Data: 33 year old married African-American male veteran with reported history of depression, anxiety and insomnia, medical history of 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  After pt reportedly sent a video to his wife making suicidal statements while holding a firearm to his head.  Patient's wife reported him as missing, and he was found in his car with send firearm.  UDS negative   Continued Clinical Symptoms:  Alcohol Use Disorder Identification Test Final Score (AUDIT): 4 The "Alcohol Use Disorders Identification Test", Guidelines for Use in Primary Care, Second Edition.  [facility]). Score between 0-7:  no or low risk or alcohol related problems. Score between 8-15:  moderate risk of alcohol related problems. Score between 16-19:  high risk of alcohol related problems. Score 20 or above:  warrants further diagnostic evaluation for alcohol dependence and treatment.   CLINICAL FACTORS:   Depression:   Hopelessness Impulsivity Insomnia More than one psychiatric diagnosis Previous Psychiatric Diagnoses and Treatments Medical Diagnoses and  Treatments/Surgeries   Musculoskeletal: Strength & Muscle Tone: within normal limits Gait & Station: normal Patient leans: N/A  Psychiatric Specialty Exam:  Presentation  General Appearance:  Appropriate for Environment  Eye Contact: Fair  Speech: Normal Rate  Speech Volume: Normal  Handedness:No data recorded  Mood and Affect  Mood: Irritable; Dysphoric; Anxious  Affect: Congruent; Depressed   Thought Process  Thought Processes: Coherent  Descriptions of Associations:Intact  Orientation:Full (Time, Place and Person)  Thought Content:WDL  History of Schizophrenia/Schizoaffective disorder:No  Duration of Psychotic Symptoms:No data recorded Hallucinations:Hallucinations: None  Ideas of Reference:None  Suicidal Thoughts:Suicidal Thoughts: No  Homicidal Thoughts:Homicidal Thoughts: No   Sensorium  Memory: Immediate Good; Recent Good; Remote Good  Judgment: Fair  Insight: Fair   Art therapist  Concentration: Good  Attention Span: Good  Recall: Good  Fund of Knowledge: Fair  Language: Fair   Psychomotor Activity  Psychomotor Activity: Psychomotor Activity: Normal   Assets  Assets: Manufacturing systems engineer; Housing; Intimacy; Physical Health; Social Support; Transportation   Sleep  Sleep: Sleep: Fair    Physical Exam: Physical Exam Vitals and nursing note reviewed.  Constitutional:      Appearance: Normal appearance.  HENT:     Head: Normocephalic and atraumatic.  Eyes:     Extraocular Movements: Extraocular movements intact.  Pulmonary:     Effort: Pulmonary effort is normal.  Musculoskeletal:        General: Normal range of motion.     Cervical back: Neck supple.  Skin:    General: Skin is dry.  Neurological:     Mental Status: He is alert and oriented to person, place, and time. Mental status is at baseline.  Psychiatric:        Attention and Perception: Attention and perception normal.        Mood  and  Affect: Mood is anxious and depressed. Affect is angry.        Speech: Speech normal.        Behavior: Behavior normal.        Thought Content: Thought content normal.        Cognition and Memory: Cognition and memory normal.        Judgment: Judgment is impulsive.      Review of Systems  Psychiatric/Behavioral:  Positive for depression. The patient is nervous/anxious and has insomnia.   All other systems reviewed and are negative.  Blood pressure 112/83, pulse 79, temperature 97.9 F (36.6 C), resp. rate 20, height 6\' 1"  (1.854 m), weight 99.8 kg, SpO2 100%. Body mass index is 29.03 kg/m.   COGNITIVE FEATURES THAT CONTRIBUTE TO RISK:  None    SUICIDE RISK:   Moderate:  Frequent suicidal ideation with limited intensity, and duration, some specificity in terms of plans, no associated intent, good self-control, limited dysphoria/symptomatology, some risk factors present, and identifiable protective factors, including available and accessible social support.  PLAN OF CARE:  Crisis stabilization, psychiatric evaluation, therapeutic milieu, group participation, medication management for anxiety, depression and suicidal ideations, and development of an individualized safety plan.  I certify that inpatient services furnished can reasonably be expected to improve the patient's condition.   David Millan, PA-C 10/08/2023, 2:59 PM

## 2023-10-09 LAB — RPR: RPR Ser Ql: NONREACTIVE

## 2023-10-09 NOTE — Plan of Care (Signed)

## 2023-10-09 NOTE — Group Note (Signed)
 LCSW Group Therapy Note  Group Date: 10/09/2023 Start Time: 1300 End Time: 1400   Type of Therapy and Topic:  Group Therapy - Healthy vs Unhealthy Coping Skills  Participation Level:  Active   Description of Group The focus of this group was to determine what unhealthy coping techniques typically are used by group members and what healthy coping techniques would be helpful in coping with various problems. Patients were guided in becoming aware of the differences between healthy and unhealthy coping techniques. Patients were asked to identify 2-3 healthy coping skills they would like to learn to use more effectively.  Therapeutic Goals Patients learned that coping is what human beings do all day long to deal with various situations in their lives Patients defined and discussed healthy vs unhealthy coping techniques Patients identified their preferred coping techniques and identified whether these were healthy or unhealthy Patients determined 2-3 healthy coping skills they would like to become more familiar with and use more often. Patients provided support and ideas to each other   Summary of Patient Progress:  During group, Patient expressed candidly. Patient proved open to input from peers and feedback from CSW. Patient demonstrated proficient insight into the subject matter, was respectful of peers, and participated throughout the entire session.   Therapeutic Modalities Cognitive Behavioral Therapy Motivational Interviewing  [name] 10/09/2023  3:55 PM

## 2023-10-09 NOTE — Group Note (Signed)
 Date:  10/09/2023 Time:  10:14 AM  Group Topic/Focus:  Coping With Mental Health Crisis:   The purpose of this group is to help patients identify strategies for coping with mental health crisis.  Group discusses possible causes of crisis and ways to manage them effectively.    Participation Level:  Active  Participation Quality:  Appropriate  Affect:  Appropriate  Cognitive:  Appropriate  Insight: Appropriate  Engagement in Group:  Engaged  Modes of Intervention:  Activity  Additional Comments:    David Shields David Shields 10/09/2023, 10:14 AM

## 2023-10-09 NOTE — Group Note (Signed)
 Recreation Therapy Group Note   Group Topic:Health and Wellness  Group Date: 10/09/2023 Start Time: 1515 End Time: 1615 Facilitators: Rosina Lowenstein, LRT, CTRS Location: Courtyard  Group Description: [employer]. LRT and patients played games of basketball, drew with chalk, and played corn hole while outside in the courtyard while getting fresh air and sunlight. Music was being played in the background. LRT and peers conversed about different games they have played before, what they do in their free time and anything else that is on their minds. LRT encouraged pts to drink water after being outside, sweating and getting their heart rate up.  Goal Area(s) Addressed: Patient will build on frustration tolerance skills. Patients will partake in a competitive play game with peers. Patients will gain knowledge of new leisure interest/hobby.    Affect/Mood: Appropriate   Participation Level: Active   Participation Quality: Independent   Behavior: Appropriate   Speech/Thought Process: Coherent   Insight: Good   Judgement: Good   Modes of Intervention: Activity   Patient Response to Interventions:  Receptive   Education Outcome:  Acknowledges education   Clinical Observations/Individualized Feedback: Colsen was active in their participation of session activities and group discussion. Pt interacted well with LRT and peers duration of session.    Plan: Continue to engage patient in RT group sessions 2-3x/week.   Rosina Lowenstein, LRT, CTRS 10/09/2023 5:08 PM

## 2023-10-09 NOTE — Progress Notes (Signed)
 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  After pt reportedly sent a video to his wife making suicidal statements while holding a firearm to his head.  Patient's wife reported him as missing, and he was found in his car with send firearm.  UDS negative    Subjective: Patient's case discussed with multidisciplinary team, all vitals and notes were reviewed.  No reported behavioral issues overnight.  Patient is seen for reassessment, reports that he is doing okay. He is denying SI/HI and AVH.  Reports that he did have some nausea last evening, however unsure if this is due to antidepressant.  Otherwise has no other complaints.  Continues to be discharged focused.  According to staff he interacts appropriately with others, does attend groups, he did refuse medications other than Eliquis last morning.  Discussed with him today importance of taking medications for hyperlipidemia, due to his history of stroke.  He verbalizes understanding.  Principal Problem: MDD (major depressive disorder), recurrent episode, severe (HCC) Diagnosis: Principal Problem:   MDD (major depressive disorder), recurrent episode, severe (HCC)  Total Time spent with patient: 20 minutes  Past Psychiatric History:  DX: Depression, anxiety, insomnia Outpatient provider: None currently Current caregiver:  Patient is own guardian/ care giver Past hospitalizations: Patient denies Medication trials : Patient can only recall Cymbalta (discontinued taking due to nausea) Suicide attempts: Patient denies Patient denies ever having an Act/CST team. Denies ECT, Clozaril treatments. Patient does have access to  firearms, recently confiscated by Patent examiner  Past Medical History:  Past Medical History:  Diagnosis Date   Anxiety    PAF (paroxysmal atrial fibrillation) (HCC)    Severe left ventricular hypertrophy    Stroke (cerebrum) (HCC)    Vertebral artery dissection (HCC)     Past Surgical History:  Procedure Laterality Date   ADENOIDECTOMY     IR ANGIO INTRA EXTRACRAN SEL COM CAROTID INNOMINATE BILAT MOD SED  09/15/2021   IR ANGIO VERTEBRAL SEL VERTEBRAL UNI L MOD SED  09/15/2021   IR ANGIOGRAM FOLLOW UP STUDY  09/15/2021   IR ANGIOGRAM FOLLOW UP STUDY  09/15/2021   IR ANGIOGRAM FOLLOW UP STUDY  09/15/2021   IR ANGIOGRAM FOLLOW UP STUDY  09/15/2021   IR ANGIOGRAM FOLLOW UP STUDY  09/15/2021   IR ANGIOGRAM FOLLOW UP STUDY  09/15/2021   IR ANGIOGRAM FOLLOW UP STUDY  09/15/2021   IR ANGIOGRAM FOLLOW UP STUDY  09/15/2021   IR ANGIOGRAM FOLLOW UP STUDY  09/15/2021   IR ANGIOGRAM FOLLOW UP STUDY  09/15/2021   IR ANGIOGRAM FOLLOW UP STUDY  09/15/2021   IR ANGIOGRAM FOLLOW UP STUDY  09/15/2021   IR CT HEAD LTD  09/15/2021   IR PERCUTANEOUS ART THROMBECTOMY/INFUSION INTRACRANIAL INC DIAG ANGIO  09/15/2021   IR TRANSCATH/EMBOLIZ  09/15/2021   RADIOLOGY WITH ANESTHESIA N/A 09/15/2021   Procedure: IR WITH ANESTHESIA;  Surgeon: Julieanne Cotton, MD;  Location: [facility] OR;  Service: Radiology;  Laterality: N/A;   Family History:  Family History  Problem Relation Age of Onset   Depression Mother    Cancer Paternal Uncle    Cancer Maternal Grandmother        breast    Hypertension Maternal  Grandmother    Family Psychiatric  History:  Mental illness: Mother-anxiety Suicide: Denies Substance use disorder: Denies Social History:  Social History   Substance and Sexual Activity  Alcohol Use Yes   Comment: occasionally     Social History   Substance and Sexual Activity  Drug Use No    Social History   Socioeconomic History   Marital status: Single    Spouse name: Not on file   Number of children:  Not on file   Years of education: Not on file   Highest education level: Not on file  Occupational History   Not on file  Tobacco Use   Smoking status: Some Days    Current packs/day: 0.50    Types: Cigarettes, Cigars   Smokeless tobacco: Never  Vaping Use   Vaping status: Never Used  Substance and Sexual Activity   Alcohol use: Yes    Comment: occasionally   Drug use: No   Sexual activity: Not on file  Other Topics Concern   Not on file  Social History Narrative   Not on file   Social Drivers of Health   Financial Resource Strain: Not on file  Food Insecurity: No Food Insecurity (10/07/2023)   Hunger Vital Sign    Worried About Running Out of Food in the Last Year: Never true    Ran Out of Food in the Last Year: Never true  Transportation Needs: No Transportation Needs (10/07/2023)   PRAPARE - Administrator, Civil Service (Medical): No    Lack of Transportation (Non-Medical): No  Physical Activity: Not on file  Stress: Not on file (06/05/2023)  Social Connections: Socially Isolated (10/07/2023)   Social Connection and Isolation Panel [NHANES]    Frequency of Communication with Friends and Family: Never    Frequency of Social Gatherings with Friends and Family: Never    Attends Religious Services: Never    Database administrator or Organizations: No    Attends Engineer, structural: Never    Marital Status: Married   Additional Social History:                         Sleep: Good  Appetite:  Good  Current Medications: Current Facility-Administered Medications  Medication Dose Route Frequency Provider Last Rate Last Admin   acetaminophen (TYLENOL) tablet 650 mg  650 mg Oral Q6H PRN Reighan Hipolito, PA-C       alum & mag hydroxide-simeth (MAALOX/MYLANTA) 200-200-20 MG/5ML suspension 30 mL  30 mL Oral Q4H PRN Aleksis Jiggetts, PA-C   30 mL at 10/08/23 2136   apixaban (ELIQUIS) tablet 5 mg  5 mg Oral BID Eleuterio Dollar, PA-C   5 mg  at 10/10/23 1610   atorvastatin (LIPITOR) tablet 80 mg  80 mg Oral QHS Grady Lucci, PA-C   80 mg at 10/09/23 2109   haloperidol (HALDOL) tablet 5 mg  5 mg Oral TID PRN Sulay Brymer, PA-C       And   diphenhydrAMINE (BENADRYL) capsule 50 mg  50 mg Oral TID PRN Raquon Milledge, PA-C       haloperidol lactate (HALDOL) injection 5 mg  5 mg Intramuscular TID PRN Amiri Riechers, PA-C       And   diphenhydrAMINE (BENADRYL) injection 50 mg  50 mg Intramuscular TID PRN Khloi Rawl, PA-C       And   LORazepam (ATIVAN) injection 2 mg  2 mg Intramuscular TID PRN Prestyn Stanco, Judeth Cornfield,  PA-C       haloperidol lactate (HALDOL) injection 10 mg  10 mg Intramuscular TID PRN Shatona Andujar, PA-C       And   diphenhydrAMINE (BENADRYL) injection 50 mg  50 mg Intramuscular TID PRN Vanessia Bokhari, PA-C       And   LORazepam (ATIVAN) injection 2 mg  2 mg Intramuscular TID PRN Nikoli Nasser, PA-C       hydrOXYzine (ATARAX) tablet 25 mg  25 mg Oral Q6H PRN Mona Ayars, PA-C       magnesium hydroxide (MILK OF MAGNESIA) suspension 30 mL  30 mL Oral Daily PRN Sanae Willetts, PA-C       metoprolol succinate (TOPROL-XL) 24 hr tablet 25 mg  25 mg Oral Daily Watson Robarge, PA-C   25 mg at 10/10/23 7829   sertraline (ZOLOFT) tablet 50 mg  50 mg Oral Daily Juris Gosnell, PA-C   50 mg at 10/10/23 5621   traZODone (DESYREL) tablet 50 mg  50 mg Oral QHS PRN Taye Cato, Judeth Cornfield, PA-C        Lab Results:  Results for orders placed or performed during the hospital encounter of 10/07/23 (from the past 48 hours)  Lipid panel     Status: Abnormal   Collection Time: 10/08/23  2:38 PM  Result Value Ref Range   Cholesterol 226 (H) 0 - 200 mg/dL   Triglycerides 82 <308 mg/dL   HDL 41 >65 mg/dL   Total CHOL/HDL Ratio 5.5 RATIO   VLDL 16 0 - 40 mg/dL   LDL Cholesterol 784 (H) 0 - 99 mg/dL    Comment:        Total Cholesterol/HDL:CHD Risk Coronary Heart Disease  Risk Table                     Men   Women  1/2 Average Risk   3.4   3.3  Average Risk       5.0   4.4  2 X Average Risk   9.6   7.1  3 X Average Risk  23.4   11.0        Use the calculated Patient Ratio above and the CHD Risk Table to determine the patient's CHD Risk.        ATP III CLASSIFICATION (LDL):  <100     mg/dL   Optimal  696-295  mg/dL   Near or Above                    Optimal  130-159  mg/dL   Borderline  284-132  mg/dL   High  >440     mg/dL   Very High Performed at [facility], [address] [place], [place] [address]   Hemoglobin A1c     Status: None   Collection Time: 10/08/23  2:38 PM  Result Value Ref Range   Hgb A1c MFr Bld 5.6 4.8 - 5.6 %    Comment: (NOTE)         Prediabetes: 5.7 - 6.4         Diabetes: >6.4         Glycemic control for adults with diabetes: <7.0    Mean Plasma Glucose 114 mg/dL    Comment: (NOTE) Performed At: [facility] [address] [place], [place] [id number] Jolene Schimke MD VQ:[id number]   RPR     Status: None   Collection Time: 10/08/23  2:38 PM  Result Value Ref Range  RPR Ser Ql NON REACTIVE NON REACTIVE    Comment: Performed at [facility], [address], [place], [place] [address]  HIV Antibody (routine testing w rflx)     Status: None   Collection Time: 10/08/23  2:38 PM  Result Value Ref Range   HIV Screen 4th Generation wRfx Non Reactive Non Reactive    Comment: Performed at [facility], [address] [place], [place] [address]  TSH     Status: None   Collection Time: 10/08/23  2:38 PM  Result Value Ref Range   TSH 1.204 0.350 - 4.500 uIU/mL    Comment: Performed by a 3rd Generation assay with a functional sensitivity of <=0.01 uIU/mL. Performed at [facility], [address] [place], [place] [address]     Blood Alcohol level:  Lab Results  Component Value Date   [facility] <10 10/06/2023   ETH <10 01/13/2023    Metabolic Disorder Labs: Lab Results   Component Value Date   HGBA1C 5.6 10/08/2023   MPG 114 10/08/2023   MPG 108.28 01/13/2023   No results found for: "PROLACTIN" Lab Results  Component Value Date   CHOL 226 (H) 10/08/2023   TRIG 82 10/08/2023   HDL 41 10/08/2023   CHOLHDL 5.5 10/08/2023   VLDL 16 10/08/2023   LDLCALC 169 (H) 10/08/2023   LDLCALC 155 (H) 01/14/2023    Physical Findings: AIMS:  , ,  ,  ,    CIWA:    COWS:     Musculoskeletal: Strength & Muscle Tone: within normal limits Gait & Station: normal Patient leans: N/A  Psychiatric Specialty Exam:  Presentation  General Appearance:  Appropriate for Environment  Eye Contact: Fair  Speech: Normal Rate  Speech Volume: Normal  Handedness:No data recorded  Mood and Affect  Mood: Irritable; Dysphoric; Anxious  Affect: Congruent; Depressed   Thought Process  Thought Processes: Coherent  Descriptions of Associations:Intact  Orientation:Full (Time, Place and Person)  Thought Content:WDL  History of Schizophrenia/Schizoaffective disorder:No  Duration of Psychotic Symptoms:No data recorded Hallucinations:No data recorded  Ideas of Reference:None  Suicidal Thoughts:No data recorded  Homicidal Thoughts:No data recorded   Sensorium  Memory: Immediate Good; Recent Good; Remote Good  Judgment: Fair  Insight: Fair   Art therapist  Concentration: Good  Attention Span: Good  Recall: Good  Fund of Knowledge: Fair  Language: Fair   Psychomotor Activity  Psychomotor Activity: No data recorded   Assets  Assets: Communication Skills; Housing; Intimacy; Physical Health; Social Support; Transportation   Sleep  Sleep: No data recorded    Physical Exam: Physical Exam Vitals and nursing note reviewed.  HENT:     Head: Normocephalic and atraumatic.  Eyes:     Extraocular Movements: Extraocular movements intact.  Pulmonary:     Effort: Pulmonary effort is normal.  Musculoskeletal:         General: Normal range of motion.     Cervical back: Normal range of motion and neck supple.  Skin:    General: Skin is dry.  Neurological:     Mental Status: He is alert and oriented to person, place, and time. Mental status is at baseline.  Psychiatric:        Attention and Perception: Attention and perception normal.        Mood and Affect: Mood and affect normal.        Speech: Speech normal.        Behavior: Behavior normal. Behavior is cooperative.  Thought Content: Thought content normal.        Cognition and Memory: Cognition and memory normal.        Judgment: Judgment is impulsive.    Review of Systems  All other systems reviewed and are negative.  Blood pressure (!) 117/99, pulse 82, temperature (!) 97.5 F (36.4 C), resp. rate 16, height 6\' 1"  (1.854 m), weight 99.8 kg, SpO2 100%. Body mass index is 29.03 kg/m.   Treatment Plan Summary:  MDD (major depressive disorder), recurrent episode, severe (HCC)   1.    Safety and Monitoring:   --  Involuntary admission to inpatient psychiatric unit for safety, stabilization and treatment -- Daily contact with patient to assess and evaluate symptoms and progress in treatment -- Patient's case to be discussed in multi-disciplinary team meeting -- Observation Level : q15 minute checks -- Vital signs:  q12 hours -- Precautions: suicide   2. Psychiatric Diagnoses and Treatment:   -- Continue Zoloft 50 mg daily for depressive symptoms -- Hydroxyzine 25 mg 3 times daily as needed for anxiety -- Trazodone 50 mg at bedtime as needed for insomnia   --  The risks/benefits/side-effects/alternatives to this medication were discussed in detail with the patient and time was given for questions. The patient consents to medication trial. -- Metabolic profile and EKG monitoring obtained while on an atypical antipsychotic  -- Encouraged patient to participate in unit milieu and in scheduled group therapies -- Short Term Goals:  Ability to identify changes in lifestyle to reduce recurrence of condition will improve, Ability to verbalize feelings will improve, Ability to disclose and discuss suicidal ideas, Ability to demonstrate self-control will improve, Ability to identify and develop effective coping behaviors will improve, Ability to maintain clinical measurements within normal limits will improve, Compliance with prescribed medications will improve, and Ability to identify triggers associated with substance abuse/mental health issues will improve -- Long Term Goals: Improvement in symptoms so as ready for discharge        3. Medical Issues Being Addressed:               History of CVA             -- Continue Eliquis 5 mg daily, metoprolol succinate 25 mg daily. Atorvastatin 80 mg HS     4. Discharge Planning:   -- Social work and case management to assist with discharge planning and identification of hospital follow-up needs prior to discharge -- Estimated LOS: 5-7 days -- Discharge Concerns: Need to establish a safety plan; Medication compliance and effectiveness -- Discharge Goals: Return home with outpatient referrals for mental health follow-up including medication management/psychotherapy  Paulene Floor, PA-C 10/10/2023, 11:06 AM

## 2023-10-09 NOTE — Group Note (Signed)
 Recreation Therapy Group Note   Group Topic:Self-Esteem  Group Date: 10/09/2023 Start Time: 1000 End Time: 1100 Facilitators: Rosina Lowenstein, LRT, CTRS Location:  Craft Room  Group Description: Positive Affirmation Worksheet. Patients and LRT discussed the importance of self-love/self-esteem and things that cause it to fluctuate, including our mental health. Patients completed a worksheet that helps them identify 24 different strengths and qualities about themselves. Pt encouraged to read aloud at least 3 off their sheet to the group. LRT and pts discussed how this can be applied to daily life post-discharge. After completing worksheet, patients played Positive Affirmation Bingo and won stress balls as prizes.  Goal Area(s) Addressed: Patient will identify positive qualities about themselves. Patient will learn new positive affirmations.  Patient will recite positive qualities and affirmations aloud to the group.  Patient will practice positive self-talk.  Patient will increase communication.   Affect/Mood: Appropriate   Participation Level: Active and Engaged   Participation Quality: Independent   Behavior: Appropriate, Calm, and Cooperative   Speech/Thought Process: Coherent   Insight: Good   Judgement: Good   Modes of Intervention: Education, Exploration, Guided Discussion, Worksheet, and Writing   Patient Response to Interventions:  Attentive, Engaged, Interested , and Receptive   Education Outcome:  Acknowledges education   Clinical Observations/Individualized Feedback: David Shields was active in their participation of session activities and group discussion. 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 Pt interacted well with LRT and peers duration of session.    Plan: Continue to engage patient in RT group sessions 2-3x/week.   Rosina Lowenstein,  LRT, CTRS 10/09/2023 11:21 AM

## 2023-10-09 NOTE — Progress Notes (Signed)
   10/09/23 1500  Psych Admission Type (Psych Patients Only)  Admission Status Involuntary  Psychosocial Assessment  Patient Complaints None  Eye Contact Fair;Watchful  Facial Expression Other (Comment) (appropriate)  Affect Appropriate to circumstance  Speech Logical/coherent  Interaction Assertive  Motor Activity Slow  Appearance/Hygiene Unremarkable;In scrubs  Behavior Characteristics Cooperative;Appropriate to situation  Mood Pleasant (patient's goal for today, per his self-inventory is "going home/managing emotions", in which he will "stay the course", in order to achieve his goal.)  Aggressive Behavior  Effect No apparent injury  Thought Process  Coherency WDL  Content WDL  Delusions None reported or observed  Perception WDL  Hallucination None reported or observed  Judgment WDL  Confusion None  Danger to Self  Current suicidal ideation? Denies  Agreement Not to Harm Self Yes  Description of Agreement Verbal  Danger to Others  Danger to Others None reported or observed

## 2023-10-09 NOTE — [facility] Suicide Risk Assessment (Signed)
 [facility] INPATIENT:  Family/Significant Other Suicide Prevention Education  Suicide Prevention Education:  Contact Attempts: Juanito Doom, [phone], Mother, has been identified by the patient as the family member/significant other with whom the patient will be residing, and identified as the person(s) who will aid the patient in the event of a mental health crisis.  With written consent from the patient, two attempts were made to provide suicide prevention education, prior to and/or following the patient's discharge.  We were unsuccessful in providing suicide prevention education.  A suicide education pamphlet was given to the patient to share with family/significant other.  Date and time of first attempt:10/09/23, 4:15 PM  Date and time of second attempt: Second attempt is needed.   David Shields 10/09/2023, 4:32 PM

## 2023-10-09 NOTE — Progress Notes (Signed)
 Pt calm and pleasant during assessment denying SI/HI/AVH. Pt observed by this Clinical research associate interacting appropriately with staff and peers on the unit. Pt compliant with medication administration per MD orders. Pt given education, support, and encouragement to be active in his treatment plan. Pt being monitored Q 15 minutes for safety per unit protocol, remains safe on the unit

## 2023-10-09 NOTE — Plan of Care (Signed)
   Problem: Education: Goal: Emotional status will improve Outcome: Progressing Goal: Mental status will improve Outcome: Progressing   Problem: Activity: Goal: Sleeping patterns will improve Outcome: Progressing

## 2023-10-10 ENCOUNTER — Other Ambulatory Visit: Payer: Self-pay

## 2023-10-10 MED ORDER — SERTRALINE HCL 50 MG PO TABS
50.0000 mg | ORAL_TABLET | Freq: Every day | ORAL | 0 refills | Status: AC
  Filled 2023-10-10: qty 30, 30d supply, fill #0

## 2023-10-10 MED ORDER — METOPROLOL SUCCINATE ER 25 MG PO TB24
25.0000 mg | ORAL_TABLET | Freq: Every day | ORAL | 0 refills | Status: AC
  Filled 2023-10-10: qty 30, 30d supply, fill #0

## 2023-10-10 MED ORDER — HYDROXYZINE HCL 25 MG PO TABS
25.0000 mg | ORAL_TABLET | Freq: Four times a day (QID) | ORAL | 0 refills | Status: AC | PRN
  Filled 2023-10-10: qty 30, 8d supply, fill #0

## 2023-10-10 MED ORDER — TRAZODONE HCL 50 MG PO TABS
50.0000 mg | ORAL_TABLET | Freq: Every evening | ORAL | 0 refills | Status: AC | PRN
  Filled 2023-10-10: qty 30, 30d supply, fill #0

## 2023-10-10 MED ORDER — ATORVASTATIN CALCIUM 80 MG PO TABS
80.0000 mg | ORAL_TABLET | Freq: Every day | ORAL | 0 refills | Status: AC
  Filled 2023-10-10: qty 30, 30d supply, fill #0

## 2023-10-10 NOTE — [facility] Suicide Risk Assessment (Signed)
 [facility] INPATIENT:  Family/Significant Other Suicide Prevention Education  Suicide Prevention Education:  Education Completed; David Land Shields/mother [phone]), has been identified by the patient as the family member/significant other with whom the patient will be residing, and identified as the person(s) who will aid the patient in the event of a mental health crisis (suicidal ideations/suicide attempt).  With written consent from the patient, the family member/significant other has been provided the following suicide prevention education, prior to the and/or following the discharge of the patient.  The suicide prevention education provided includes the following: Suicide risk factors Suicide prevention and interventions National Suicide Hotline telephone number [facility] assessment telephone number [facility] Emergency Assistance 911 [facility] and/or Residential Mobile Crisis Unit telephone number  Request made of family/significant other to: Remove weapons (e.g., guns, rifles, knives), all items previously/currently identified as safety concern.   Remove drugs/medications (over-the-counter, prescriptions, illicit drugs), all items previously/currently identified as a safety concern.  The family member/significant other verbalizes understanding of the suicide prevention education information provided.  The family member/significant other agrees to remove the items of safety concern listed above.  David Shields shared that she was not certain what had happened prior to admission to the hospital but did share that this is the first time that anything like this has happened. She denied feeling that pt is a danger to himself or anyone else. She also denied pt having access to any weapons. David Shields inquired regarding pt discharge and it was explained to her that nothing has been set in stone at this time. She inquired regarding how pt was doing and was updated that he has been  participating in treatment appropriately.  David Shields 10/10/2023, 2:41 PM

## 2023-10-10 NOTE — [facility] Counselor (Signed)
 CSW met with pt briefly to discuss discharge plans. Pt shared plans to return home upon discharge and stated that someone in his support system would provide transportation (names his wife, mother, or sister). He informed CSW that he receives his outpatient care through the [facility] and verified that CSW could call to schedule follow up appointment. Pt has clothes to wear at discharge. He denied any use of tobacco products or need for substance use services. No other concerns expressed. Contact ended without incident.   Vilma Meckel. Algis Greenhouse, MSW, LCSW, LCAS 10/10/2023 2:36 PM

## 2023-10-10 NOTE — Progress Notes (Addendum)
 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  After pt reportedly sent a video to his wife making suicidal statements while holding a firearm to his head.  Patient's wife reported him as missing, and he was found in his car with send firearm.  UDS negative    Subjective: Patient's case discussed with multidisciplinary team, all vitals and notes were reviewed.  No reported behavioral issues overnight.  Patient is seen for reassessment, he continues to deny all psychiatric symptoms.  Reports that he slept all night long overnight, which is the longest he slept in a long time.  Reports good appetite.  Interacts appropriately with others, med compliant, does attend groups.  Spoke with the patient's mother Marylene Land for collateral, [phone], she reports that as far as she is aware the patient and his wife were having issues at home, then he sent a video to his spouse threatening to harm himself, did not go home so he was reported as a missing person.  Patient was found in a parking lot with firearm which was confiscated by Designer, television/film set.  Patient's mother denies any prior episodes such as this, states that she is not usually around the patient and is unable to tell if he had been depressed or exhibiting any abnormal behaviors prior to this episode.  States that the patient had just come back from vacation with his family.  That at this time she thinks that patient's marriage is over and that he may not be able to return there, however she is willing to pick him up in take him home with her at discharge.  Did attempt to contact the patient's spouse for collateral, left  voicemail.  Update 1530: Received call back from the patient's wife, states that he ended up in the hospital because he sent her a video stating that he was over everything, that she should tell his mother and his son that he loves them and that they do not deserve him, and was observed to be waving a firearm in the video.  States that after that she was unable to get in contact with him so she put a missing persons report about for him.  States that the patient always appears to be depressed, however this is normal for him and it is not usually very significant.  That he usually speaks more to his mother about his depressive symptoms.  She denies any triggering events which may have led to the patient's recent episode, stating that they had just come back from vacation, and he made a request from her which she was not able to do so he left.  States she thinks that he was upset about this.  He has been taking medications for mental illness, however she is unsure if he takes it all the time.  Goes on to relay that " a lot of things he does is for attention, I do not think he will really hurt himself he is never done that".  States that patient does not have access to any weapons, these were confiscated by Patent examiner.  She does confirm that the patient will not be able to return home, that he is supposed to be  going to stay with his parents at discharge.  She has no concerns about the patient's safety at this time.  All questions and concerns were addressed during phone communication.   Principal Problem: MDD (major depressive disorder), recurrent episode, severe (HCC) Diagnosis: Principal Problem:   MDD (major depressive disorder), recurrent episode, severe (HCC)  Total Time spent with patient: 20 minutes  Past Psychiatric History:  DX: Depression, anxiety, insomnia Outpatient provider: None currently Current caregiver:  Patient is own guardian/ care giver Past hospitalizations: Patient  denies Medication trials : Patient can only recall Cymbalta (discontinued taking due to nausea) Suicide attempts: Patient denies Patient denies ever having an Act/CST team. Denies ECT, Clozaril treatments. Patient does have access to firearms, recently confiscated by Patent examiner  Past Medical History:  Past Medical History:  Diagnosis Date   Anxiety    PAF (paroxysmal atrial fibrillation) (HCC)    Severe left ventricular hypertrophy    Stroke (cerebrum) (HCC)    Vertebral artery dissection (HCC)     Past Surgical History:  Procedure Laterality Date   ADENOIDECTOMY     IR ANGIO INTRA EXTRACRAN SEL COM CAROTID INNOMINATE BILAT MOD SED  09/15/2021   IR ANGIO VERTEBRAL SEL VERTEBRAL UNI L MOD SED  09/15/2021   IR ANGIOGRAM FOLLOW UP STUDY  09/15/2021   IR ANGIOGRAM FOLLOW UP STUDY  09/15/2021   IR ANGIOGRAM FOLLOW UP STUDY  09/15/2021   IR ANGIOGRAM FOLLOW UP STUDY  09/15/2021   IR ANGIOGRAM FOLLOW UP STUDY  09/15/2021   IR ANGIOGRAM FOLLOW UP STUDY  09/15/2021   IR ANGIOGRAM FOLLOW UP STUDY  09/15/2021   IR ANGIOGRAM FOLLOW UP STUDY  09/15/2021   IR ANGIOGRAM FOLLOW UP STUDY  09/15/2021   IR ANGIOGRAM FOLLOW UP STUDY  09/15/2021   IR ANGIOGRAM FOLLOW UP STUDY  09/15/2021   IR ANGIOGRAM FOLLOW UP STUDY  09/15/2021   IR CT HEAD LTD  09/15/2021   IR PERCUTANEOUS ART THROMBECTOMY/INFUSION INTRACRANIAL INC DIAG ANGIO  09/15/2021   IR TRANSCATH/EMBOLIZ  09/15/2021   RADIOLOGY WITH ANESTHESIA N/A 09/15/2021   Procedure: IR WITH ANESTHESIA;  Surgeon: Julieanne Cotton, MD;  Location: [facility] OR;  Service: Radiology;  Laterality: N/A;   Family History:  Family History  Problem Relation Age of Onset   Depression Mother    Cancer Paternal Uncle    Cancer Maternal Grandmother        breast    Hypertension Maternal Grandmother    Family Psychiatric  History:  Mental illness: Mother-anxiety Suicide: Denies Substance use disorder: Denies Social History:  Social History   Substance and Sexual  Activity  Alcohol Use Yes   Comment: occasionally     Social History   Substance and Sexual Activity  Drug Use No    Social History   Socioeconomic History   Marital status: Single    Spouse name: Not on file   Number of children: Not on file   Years of education: Not on file   Highest education level: Not on file  Occupational History   Not on file  Tobacco Use   Smoking status: Some Days    Current packs/day: 0.50    Types: Cigarettes, Cigars   Smokeless tobacco: Never  Vaping Use   Vaping status: Never Used  Substance and Sexual Activity   Alcohol use: Yes    Comment: occasionally   Drug use: No   Sexual activity: Not on file  Other Topics Concern   Not on file  Social  History Narrative   Not on file   Social Drivers of Health   Financial Resource Strain: Not on file  Food Insecurity: No Food Insecurity (10/07/2023)   Hunger Vital Sign    Worried About Running Out of Food in the Last Year: Never true    Ran Out of Food in the Last Year: Never true  Transportation Needs: No Transportation Needs (10/07/2023)   PRAPARE - Administrator, Civil Service (Medical): No    Lack of Transportation (Non-Medical): No  Physical Activity: Not on file  Stress: Not on file (06/05/2023)  Social Connections: Socially Isolated (10/07/2023)   Social Connection and Isolation Panel [NHANES]    Frequency of Communication with Friends and Family: Never    Frequency of Social Gatherings with Friends and Family: Never    Attends Religious Services: Never    Database administrator or Organizations: No    Attends Engineer, structural: Never    Marital Status: Married   Additional Social History:                         Sleep: Good  Appetite:  Good  Current Medications: Current Facility-Administered Medications  Medication Dose Route Frequency Provider Last Rate Last Admin   acetaminophen (TYLENOL) tablet 650 mg  650 mg Oral Q6H PRN Jesika Men,  PA-C       alum & mag hydroxide-simeth (MAALOX/MYLANTA) 200-200-20 MG/5ML suspension 30 mL  30 mL Oral Q4H PRN Rella Egelston, , PA-C   30 mL at 10/08/23 2136   apixaban (ELIQUIS) tablet 5 mg  5 mg Oral BID Jahara Dail, PA-C   5 mg at 10/10/23 6578   atorvastatin (LIPITOR) tablet 80 mg  80 mg Oral QHS Tishana Clinkenbeard, PA-C   80 mg at 10/09/23 2109   haloperidol (HALDOL) tablet 5 mg  5 mg Oral TID PRN Illiana Losurdo, PA-C       And   diphenhydrAMINE (BENADRYL) capsule 50 mg  50 mg Oral TID PRN Pradyun Ishman, PA-C       haloperidol lactate (HALDOL) injection 5 mg  5 mg Intramuscular TID PRN Zacary Bauer, PA-C       And   diphenhydrAMINE (BENADRYL) injection 50 mg  50 mg Intramuscular TID PRN Korinna Tat, PA-C       And   LORazepam (ATIVAN) injection 2 mg  2 mg Intramuscular TID PRN Elfreda Blanchet, PA-C       haloperidol lactate (HALDOL) injection 10 mg  10 mg Intramuscular TID PRN Shirin Echeverry, PA-C       And   diphenhydrAMINE (BENADRYL) injection 50 mg  50 mg Intramuscular TID PRN Bryanah Sidell, PA-C       And   LORazepam (ATIVAN) injection 2 mg  2 mg Intramuscular TID PRN Aldin Drees, PA-C       hydrOXYzine (ATARAX) tablet 25 mg  25 mg Oral Q6H PRN Mehak Roskelley, PA-C       magnesium hydroxide (MILK OF MAGNESIA) suspension 30 mL  30 mL Oral Daily PRN Floria Brandau, PA-C       metoprolol succinate (TOPROL-XL) 24 hr tablet 25 mg  25 mg Oral Daily Kenlynn Houde, PA-C   25 mg at 10/10/23 0833   sertraline (ZOLOFT) tablet 50 mg  50 mg Oral Daily Greogory Cornette, PA-C   50 mg at 10/10/23 0834   traZODone (DESYREL) tablet 50 mg  50 mg Oral QHS PRN , Judeth Cornfield, PA-C  Lab Results:  Results for orders placed or performed during the hospital encounter of 10/07/23 (from the past 48 hours)  Lipid panel     Status: Abnormal   Collection Time: 10/08/23  2:38 PM  Result Value Ref Range   Cholesterol  226 (H) 0 - 200 mg/dL   Triglycerides 82 <161 mg/dL   HDL 41 >09 mg/dL   Total CHOL/HDL Ratio 5.5 RATIO   VLDL 16 0 - 40 mg/dL   LDL Cholesterol 604 (H) 0 - 99 mg/dL    Comment:        Total Cholesterol/HDL:CHD Risk Coronary Heart Disease Risk Table                     Men   Women  1/2 Average Risk   3.4   3.3  Average Risk       5.0   4.4  2 X Average Risk   9.6   7.1  3 X Average Risk  23.4   11.0        Use the calculated Patient Ratio above and the CHD Risk Table to determine the patient's CHD Risk.        ATP III CLASSIFICATION (LDL):  <100     mg/dL   Optimal  540-981  mg/dL   Near or Above                    Optimal  130-159  mg/dL   Borderline  191-478  mg/dL   High  >295     mg/dL   Very High Performed at [facility], [address] [place], [place] [address]   Hemoglobin A1c     Status: None   Collection Time: 10/08/23  2:38 PM  Result Value Ref Range   Hgb A1c MFr Bld 5.6 4.8 - 5.6 %    Comment: (NOTE)         Prediabetes: 5.7 - 6.4         Diabetes: >6.4         Glycemic control for adults with diabetes: <7.0    Mean Plasma Glucose 114 mg/dL    Comment: (NOTE) Performed At: [facility] [address] [place], [place] [id number] Jolene Schimke MD EX:[id number]   RPR     Status: None   Collection Time: 10/08/23  2:38 PM  Result Value Ref Range   RPR Ser Ql NON REACTIVE NON REACTIVE    Comment: Performed at [facility], [address] [place], [place] [address]  HIV Antibody (routine testing w rflx)     Status: None   Collection Time: 10/08/23  2:38 PM  Result Value Ref Range   HIV Screen 4th Generation wRfx Non Reactive Non Reactive    Comment: Performed at [facility], [address] [place], [place] [address]  TSH     Status: None   Collection Time: 10/08/23  2:38 PM  Result Value Ref Range   TSH 1.204 0.350 - 4.500 uIU/mL    Comment: Performed by a 3rd Generation assay with a functional sensitivity of <=0.01  uIU/mL. Performed at [facility], [address], [place], [place] [address]     Blood Alcohol level:  Lab Results  Component Value Date   [facility] <10 10/06/2023   ETH <10 01/13/2023    Metabolic Disorder Labs: Lab Results  Component Value Date   HGBA1C 5.6 10/08/2023   MPG 114 10/08/2023  MPG 108.28 01/13/2023   No results found for: "PROLACTIN" Lab Results  Component Value Date   CHOL 226 (H) 10/08/2023   TRIG 82 10/08/2023   HDL 41 10/08/2023   CHOLHDL 5.5 10/08/2023   VLDL 16 10/08/2023   LDLCALC 169 (H) 10/08/2023   LDLCALC 155 (H) 01/14/2023    Physical Findings: AIMS:  , ,  ,  ,    CIWA:    COWS:     Musculoskeletal: Strength & Muscle Tone: within normal limits Gait & Station: normal Patient leans: N/A  Psychiatric Specialty Exam:  Presentation  General Appearance:  Appropriate for Environment  Eye Contact: Fair  Speech: Normal Rate  Speech Volume: Normal  Handedness:No data recorded  Mood and Affect  Mood: Irritable; Dysphoric; Anxious  Affect: Congruent; Depressed   Thought Process  Thought Processes: Coherent  Descriptions of Associations:Intact  Orientation:Full (Time, Place and Person)  Thought Content:WDL  History of Schizophrenia/Schizoaffective disorder:No  Duration of Psychotic Symptoms:No data recorded Hallucinations:No data recorded  Ideas of Reference:None  Suicidal Thoughts:No data recorded  Homicidal Thoughts:No data recorded   Sensorium  Memory: Immediate Good; Recent Good; Remote Good  Judgment: Fair  Insight: Fair   Art therapist  Concentration: Good  Attention Span: Good  Recall: Good  Fund of Knowledge: Fair  Language: Fair   Psychomotor Activity  Psychomotor Activity: No data recorded   Assets  Assets: Communication Skills; Housing; Intimacy; Physical Health; Social Support; Transportation   Sleep  Sleep: No data recorded    Physical  Exam: Physical Exam Vitals and nursing note reviewed.  HENT:     Head: Normocephalic and atraumatic.  Eyes:     Extraocular Movements: Extraocular movements intact.  Pulmonary:     Effort: Pulmonary effort is normal.  Musculoskeletal:        General: Normal range of motion.     Cervical back: Normal range of motion and neck supple.  Skin:    General: Skin is dry.  Neurological:     Mental Status: He is alert and oriented to person, place, and time. Mental status is at baseline.  Psychiatric:        Attention and Perception: Attention and perception normal.        Mood and Affect: Mood and affect normal.        Speech: Speech normal.        Behavior: Behavior normal. Behavior is cooperative.        Thought Content: Thought content normal.        Cognition and Memory: Cognition and memory normal.        Judgment: Judgment is impulsive.    Review of Systems  All other systems reviewed and are negative.  Blood pressure (!) 117/99, pulse 82, temperature (!) 97.5 F (36.4 C), resp. rate 16, height 6\' 1"  (1.854 m), weight 99.8 kg, SpO2 100%. Body mass index is 29.03 kg/m.   Treatment Plan Summary:  MDD (major depressive disorder), recurrent episode, severe (HCC)   1.    Safety and Monitoring:   --  Involuntary admission to inpatient psychiatric unit for safety, stabilization and treatment -- Daily contact with patient to assess and evaluate symptoms and progress in treatment -- Patient's case to be discussed in multi-disciplinary team meeting -- Observation Level : q15 minute checks -- Vital signs:  q12 hours -- Precautions: suicide   2. Psychiatric Diagnoses and Treatment:   -- Continue Zoloft 50 mg daily for depressive symptoms -- Hydroxyzine 25 mg 3 times daily  as needed for anxiety -- Trazodone 50 mg at bedtime as needed for insomnia   --  The risks/benefits/side-effects/alternatives to this medication were discussed in detail with the patient and time was given for  questions. The patient consents to medication trial. -- Metabolic profile and EKG monitoring obtained while on an atypical antipsychotic  -- Encouraged patient to participate in unit milieu and in scheduled group therapies -- Short Term Goals: Ability to identify changes in lifestyle to reduce recurrence of condition will improve, Ability to verbalize feelings will improve, Ability to disclose and discuss suicidal ideas, Ability to demonstrate self-control will improve, Ability to identify and develop effective coping behaviors will improve, Ability to maintain clinical measurements within normal limits will improve, Compliance with prescribed medications will improve, and Ability to identify triggers associated with substance abuse/mental health issues will improve -- Long Term Goals: Improvement in symptoms so as ready for discharge        3. Medical Issues Being Addressed:               History of CVA             -- Continue Eliquis 5 mg daily, metoprolol succinate 25 mg daily. Atorvastatin 80 mg HS     4. Discharge Planning:   -- Social work and case management to assist with discharge planning and identification of hospital follow-up needs prior to discharge -- Estimated LOS: 5-7 days -- Discharge Concerns: Need to establish a safety plan; Medication compliance and effectiveness -- Discharge Goals: Return home with outpatient referrals for mental health follow-up including medication management/psychotherapy  Paulene Floor, PA-C 10/10/2023, 12:54 PM

## 2023-10-10 NOTE — Progress Notes (Signed)
   10/10/23 0900  Psych Admission Type (Psych Patients Only)  Admission Status Involuntary  Psychosocial Assessment  Patient Complaints Depression  Eye Contact Fair  Facial Expression Animated  Affect Appropriate to circumstance  Speech Logical/coherent  Interaction Assertive  Motor Activity  (appopriate for developmentl age and situation)  Appearance/Hygiene Unremarkable  Behavior Characteristics Cooperative  Mood Pleasant (Patient reports his goal for tody is discharge, to attend groups, stress management and to stay motivated.)  Thought Process  Coherency WDL  Content WDL  Delusions None reported or observed  Perception WDL  Hallucination None reported or observed  Judgment WDL  Confusion None  Danger to Self  Current suicidal ideation? Denies  Agreement Not to Harm Self Yes  Description of Agreement Verbal  Danger to Others  Danger to Others None reported or observed

## 2023-10-10 NOTE — Group Note (Signed)
 Date:  10/10/2023 Time:  11:26 AM  Group Topic/Focus:  Personal Choices and Values:   The focus of this group is to help patients assess and explore the importance of values in their lives, how their values affect their decisions, how they express their values and what opposes their expression.    Participation Level:  Active  Participation Quality:  Appropriate  Affect:  Appropriate  Cognitive:  Appropriate  Insight: Appropriate  Engagement in Group:  Engaged  Modes of Intervention:  Activity  Additional Comments:    Mary Sella Imogean Ciampa 10/10/2023, 11:26 AM

## 2023-10-10 NOTE — Group Note (Signed)
 Date:  10/10/2023 Time:  9:00 PM  Group Topic/Focus:  Wrap-Up Group:   The focus of this group is to help patients review their daily goal of treatment and discuss progress on daily workbooks.    Participation Level:  Active  Participation Quality:  Appropriate  Affect:  Appropriate  Cognitive:  Appropriate  Insight: Appropriate  Engagement in Group:  Engaged  Modes of Intervention:  Discussion  Additional Comments:     Belva Crome 10/10/2023, 9:00 PM

## 2023-10-10 NOTE — Group Note (Signed)
 Recreation Therapy Group Note   Group Topic:Leisure Education  Group Date: 10/10/2023 Start Time: 1050 End Time: 1150 Facilitators: Rosina Lowenstein, LRT, CTRS Location:  Craft Room  Group Description: Leisure. Patients were given the option to choose from singing karaoke, coloring mandalas, using oil pastels, journaling, or playing with play-doh. LRT and pts discussed the meaning of leisure, the importance of participating in leisure during their free time/when they're outside of the hospital, as well as how our leisure interests can also serve as coping skills.   Goal Area(s) Addressed:  Patient will identify a current leisure interest.  Patient will learn the definition of "leisure". Patient will practice making a positive decision. Patient will have the opportunity to try a new leisure activity. Patient will communicate with peers and LRT.    Affect/Mood: Appropriate   Participation Level: Active and Engaged   Participation Quality: Independent   Behavior: Appropriate, Calm, and Cooperative   Speech/Thought Process: Coherent   Insight: Fair   Judgement: Fair    Modes of Intervention: Education, Exploration, Music, and Socialization   Patient Response to Interventions:  Attentive, Engaged, and Interested    Education Outcome:  Acknowledges education   Clinical Observations/Individualized Feedback: Yuuki was active in their participation of session activities and group discussion. 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 Pt chose to draw and listen to music while in group. Pt interacted well with LRT and peers duration of session.    Plan: Continue to engage patient in RT group sessions 2-3x/week.   Rosina Lowenstein, LRT, CTRS 10/10/2023 12:52 PM

## 2023-10-10 NOTE — Plan of Care (Signed)
   Problem: Education: Goal: Emotional status will improve Outcome: Progressing Goal: Mental status will improve Outcome: Progressing Goal: Verbalization of understanding the information provided will improve Outcome: Progressing   Problem: Activity: Goal: Interest or engagement in activities will improve Outcome: Progressing Goal: Sleeping patterns will improve Outcome: Progressing   Problem: Coping: Goal: Ability to verbalize frustrations and anger appropriately will improve Outcome: Progressing   Problem: Safety: Goal: Periods of time without injury will increase Outcome: Progressing

## 2023-10-10 NOTE — Progress Notes (Signed)
 Pt visible in in the milieu and engages with peers and staff.   Reports elevation of mood.  Pt denied SI/SIB/HI, AVH, depression and anxiety. When as about SI, pt states "I no longer want to kill myself.  I want to be with my family; I am missing my child."  He informed that he is working on a support net to include his wife.      10/09/23 2200  Psych Admission Type (Psych Patients Only)  Admission Status Involuntary  Psychosocial Assessment  Patient Complaints None  Eye Contact Watchful  Facial Expression Animated  Affect Appropriate to circumstance  Speech Logical/coherent  Interaction Assertive  Motor Activity Slow  Appearance/Hygiene Unremarkable  Behavior Characteristics Cooperative  Mood Pleasant  Aggressive Behavior  Effect No apparent injury  Thought Process  Coherency WDL  Content WDL  Delusions None reported or observed  Perception WDL  Hallucination None reported or observed  Judgment WDL  Confusion None  Danger to Self  Current suicidal ideation? Denies  Agreement Not to Harm Self Yes  Description of Agreement Verbal  Danger to Others  Danger to Others None reported or observed   Goal: Emotional status will improve Outcome: Progressing Goal: Mental status will improve Outcome: Progressing Goal: Verbalization of understanding the information provided will improve Outcome: Progressing   Problem: Activity: Goal: Interest or engagement in activities will improve Outcome: Progressing Goal: Sleeping patterns will improve Outcome: Progressing   Problem: Coping: Goal: Ability to verbalize frustrations and anger appropriately will improve Outcome: Progressing Goal: Ability to demonstrate self-control will improve Outcome: Progressing   Problem: Health Behavior/Discharge Planning: Goal: Identification of resources available to assist in meeting health care needs will improve Outcome: Progressing Goal: Compliance with treatment plan for underlying cause of  condition will improve Outcome: Progressing   Problem: Physical Regulation: Goal: Ability to maintain clinical measurements within normal limits will improve Outcome: Progressing   Problem: Safety: Goal: Periods of time without injury will increase Outcome: Progressing

## 2023-10-10 NOTE — Group Note (Signed)
 Recreation Therapy Group Note   Group Topic:Stress Management  Group Date: 10/10/2023 Start Time: 1515 End Time: 1610 Facilitators: Rosina Lowenstein, LRT, CTRS Location:  Craft Room  Group Description: Scattergories. LRT and pts played rounds of the game Scattergories while divided up in three groups. Scattergories requires you to think and write fast, communicate clearly, and work together all while under a time restraint. LRT and peers discussed the importance of working well with others, communicating effectively, and being a part of a team. LRT and pts discussed how this can be used post discharge.   Goal Areas Addressed:  Patient will engage in competitive recreational game with peers. Patient will increase communication skills.  Patient will practice being a part of a team. Patient will identify physical signs of stress. Patient will identify mental signs of stress. Patient will gain frustration tolerance skills.    Affect/Mood: Appropriate   Participation Level: Active   Participation Quality: Independent   Behavior: Appropriate   Speech/Thought Process: Coherent   Insight: Good   Judgement: Good   Modes of Intervention: Activity   Patient Response to Interventions:  Receptive   Education Outcome:  Acknowledges education   Clinical Observations/Individualized Feedback: David Shields was active in their participation of session activities and group discussion. Pt interacted well with LRT and peers duration of session.    Plan: Continue to engage patient in RT group sessions 2-3x/week.   Rosina Lowenstein, LRT, CTRS 10/10/2023 4:47 PM

## 2023-10-11 NOTE — Discharge Summary (Signed)
 Physician Discharge Summary Note  Patient:  David Shields is an 33 y.o., male MRN:  [id number] DOB:  05-25-1991 Patient phone:  [phone] (home)  Patient address:   [address] 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  After pt reportedly sent a video to his wife making suicidal statements while holding a firearm to his head.  Patient's wife reported him as missing, and he was found in his car with send firearm.  UDS negative   Principal Problem: MDD (major depressive disorder), recurrent episode, severe (HCC) Discharge Diagnoses: Principal Problem:   MDD (major depressive disorder), recurrent episode, severe (HCC)   Past Psychiatric History: DX: Depression, anxiety, insomnia Outpatient provider: None currently Current caregiver:  Patient is own guardian/ care giver Past hospitalizations: Patient denies Medication trials : Patient can only recall Cymbalta (discontinued taking due to nausea) Suicide attempts: Patient denies Patient denies ever having an Act/CST team. Denies ECT, Clozaril treatments. Patient does have access to firearms, recently confiscated by Patent examiner    Past Medical History:  Past Medical History:  Diagnosis Date   Anxiety    PAF (paroxysmal atrial fibrillation) (HCC)    Severe left ventricular hypertrophy    Stroke (cerebrum) (HCC)    Vertebral artery dissection (HCC)     Past Surgical History:  Procedure Laterality Date   ADENOIDECTOMY     IR ANGIO INTRA EXTRACRAN SEL COM CAROTID INNOMINATE BILAT MOD SED  09/15/2021   IR ANGIO VERTEBRAL SEL  VERTEBRAL UNI L MOD SED  09/15/2021   IR ANGIOGRAM FOLLOW UP STUDY  09/15/2021   IR ANGIOGRAM FOLLOW UP STUDY  09/15/2021   IR ANGIOGRAM FOLLOW UP STUDY  09/15/2021   IR ANGIOGRAM FOLLOW UP STUDY  09/15/2021   IR ANGIOGRAM FOLLOW UP STUDY  09/15/2021   IR ANGIOGRAM FOLLOW UP STUDY  09/15/2021   IR ANGIOGRAM FOLLOW UP STUDY  09/15/2021   IR ANGIOGRAM FOLLOW UP STUDY  09/15/2021   IR ANGIOGRAM FOLLOW UP STUDY  09/15/2021   IR ANGIOGRAM FOLLOW UP STUDY  09/15/2021   IR ANGIOGRAM FOLLOW UP STUDY  09/15/2021   IR ANGIOGRAM FOLLOW UP STUDY  09/15/2021   IR CT HEAD LTD  09/15/2021   IR PERCUTANEOUS ART THROMBECTOMY/INFUSION INTRACRANIAL INC DIAG ANGIO  09/15/2021   IR TRANSCATH/EMBOLIZ  09/15/2021   RADIOLOGY WITH ANESTHESIA N/A 09/15/2021   Procedure: IR WITH ANESTHESIA;  Surgeon: Luellen Sages, MD;  Location: [facility] OR;  Service: Radiology;  Laterality: N/A;   Family History:  Family History  Problem Relation Age of Onset   Depression Mother    Cancer Paternal Uncle    Cancer Maternal Grandmother        breast    Hypertension Maternal Grandmother    Family Psychiatric  History: Mental illness: Mother-anxiety Suicide: Denies Substance use disorder: Denies Social History:  Social History   Substance and Sexual Activity  Alcohol Use Yes   Comment: occasionally     Social History   Substance and Sexual Activity  Drug Use No    Social History   Socioeconomic History   Marital status: Single    Spouse name: Not  on file   Number of children: Not on file   Years of education: Not on file   Highest education level: Not on file  Occupational History   Not on file  Tobacco Use   Smoking status: Some Days    Current packs/day: 0.50    Types: Cigarettes, Cigars   Smokeless tobacco: Never  Vaping Use   Vaping status: Never Used  Substance and Sexual Activity   Alcohol use: Yes    Comment: occasionally   Drug use: No   Sexual activity: Not on file  Other Topics Concern   Not on file   Social History Narrative   Not on file   Social Drivers of Health   Financial Resource Strain: Not on file  Food Insecurity: No Food Insecurity (10/07/2023)   Hunger Vital Sign    Worried About Running Out of Food in the Last Year: Never true    Ran Out of Food in the Last Year: Never true  Transportation Needs: No Transportation Needs (10/07/2023)   PRAPARE - Administrator, Civil Service (Medical): No    Lack of Transportation (Non-Medical): No  Physical Activity: Not on file  Stress: Not on file (06/05/2023)  Social Connections: Socially Isolated (10/07/2023)   Social Connection and Isolation Panel [NHANES]    Frequency of Communication with Friends and Family: Never    Frequency of Social Gatherings with Friends and Family: Never    Attends Religious Services: Never    Database administrator or Organizations: No    Attends Engineer, structural: Never    Marital Status: Married    Hospital Course:   During the course of hospitalization, pt received daily multiple modalities of treatments consisting of Psychopharmacology, individual, group, psychoeducational, recreational, milieu therapy, including case management to coordinate pts inpatient and outpatient care and in concert with weekly treatment team meetings. Discharge planning was initiated on the day of admission to ensure a safe discharge. The presenting symptoms were closely monitored and medications were started as indicated. There were no complications. The principal reasons for hospitalization consisted of adjustment disorder with disturbance in conduct, depressed mood, history of MDD  Medications addressing the principal problem were initiated with improvement in severity sufficient to discharge to a lower level of care. Patient was started on Zoloft 50 mg daily for MDD.  Was also ordered as needed trazodone 50 mg at bedtime for sleep, hydroxyzine 25 mg 3 times daily as needed for anxiety.  It is intended  for the outpatient provider to determine whether to continue these medications, or if these medication needs to be titrated for continued outpatient therapy.  All identified psychiatric, general medical/surgical psychosocial obstacles to discharge were addressed. Patient tolerated these medications with no noted side effects. All these medications were titrated to discharge levels (Please see discharge medications below). Patient showed slow but steady and sustained symptomatic improvement before discharge. The patient denied suicidal, homicidal ideations and hallucinations. Family session held to determine baseline behaviors and for safe discharge plan.  On the day of discharge 10/11/2023, following sustained improvement in the affect of this patient, continued report of euthymic mood, repeated denial of suicidal, homicidal and other violent ideations, adequate interaction with peers, active participation in groups while on the unit, and denial of adverse reactions from the medications, the treatment team decided that Zondra Hipps was stable for discharge back  to his mother's home with scheduled mental health treatment as below. A comprehensive risk assessment was done prior  to discharge and shows that patient is at low risk for suicide or violence and will continue to be if patient complies with the treatment recommendations, medications and therapy.  At the time of discharge, patient no longer meeting criteria for IVC, patient is not an imminent danger to self or others. patient agrees to call Crisis Services, 911 and/or return to the ED if safety cannot be maintained outside the hospital setting. Discharge medications reviewed with patient, explanation of indication, risks/benefits and side effects profiles. The patient verbalized understanding and is in agreement with the discharge plan.  Physical Findings: AIMS:  , ,  ,  ,    CIWA:    COWS:     Musculoskeletal: Strength & Muscle Tone: within  normal limits Gait & Station: normal Patient leans: N/A   Psychiatric Specialty Exam:  Presentation  General Appearance:  Appropriate for Environment  Eye Contact: Good  Speech: Normal Rate; Clear and Coherent  Speech Volume: Normal  Handedness:No data recorded  Mood and Affect  Mood: Euthymic  Affect: Appropriate   Thought Process  Thought Processes: Coherent; Linear  Descriptions of Associations:Intact  Orientation:Full (Time, Place and Person)  Thought Content:WDL  History of Schizophrenia/Schizoaffective disorder:No  Duration of Psychotic Symptoms:No data recorded Hallucinations:Hallucinations: None  Ideas of Reference:None  Suicidal Thoughts:Suicidal Thoughts: No  Homicidal Thoughts:Homicidal Thoughts: No   Sensorium  Memory: Immediate Good; Recent Good; Remote Good  Judgment: Fair  Insight: Fair   Art therapist  Concentration: Good  Attention Span: Good  Recall: Good  Fund of Knowledge: Fair  Language: Fair   Psychomotor Activity  Psychomotor Activity: Psychomotor Activity: Normal   Assets  Assets: Communication Skills; Social Support; Physical Health; Housing; Transportation   Sleep  Sleep: Sleep: Good    Physical Exam: Physical Exam Vitals and nursing note reviewed.  HENT:     Head: Normocephalic and atraumatic.  Eyes:     Extraocular Movements: Extraocular movements intact.  Pulmonary:     Effort: Pulmonary effort is normal.  Musculoskeletal:        General: Normal range of motion.     Cervical back: Normal range of motion and neck supple.  Skin:    General: Skin is dry.  Neurological:     Mental Status: He is alert and oriented to person, place, and time. Mental status is at baseline.  Psychiatric:        Attention and Perception: Attention and perception normal.        Mood and Affect: Mood and affect normal.        Speech: Speech normal.        Behavior: Behavior normal. Behavior is  cooperative.        Thought Content: Thought content normal.        Cognition and Memory: Cognition and memory normal.        Judgment: Judgment normal.    Review of Systems  All other systems reviewed and are negative.  Blood pressure 124/86, pulse 88, temperature 97.6 F (36.4 C), resp. rate 16, height 6\' 1"  (1.854 m), weight 99.8 kg, SpO2 100%. Body mass index is 29.03 kg/m.   Social History   Tobacco Use  Smoking Status Some Days   Current packs/day: 0.50   Types: Cigarettes, Cigars  Smokeless Tobacco Never   Tobacco Cessation:  N/A, patient does not currently use tobacco products   Blood Alcohol level:  Lab Results  Component Value Date   ETH <10 10/06/2023   ETH <10 01/13/2023  Metabolic Disorder Labs:  Lab Results  Component Value Date   HGBA1C 5.6 10/08/2023   MPG 114 10/08/2023   MPG 108.28 01/13/2023   No results found for: "PROLACTIN" Lab Results  Component Value Date   CHOL 226 (H) 10/08/2023   TRIG 82 10/08/2023   HDL 41 10/08/2023   CHOLHDL 5.5 10/08/2023   VLDL 16 10/08/2023   LDLCALC 169 (H) 10/08/2023   LDLCALC 155 (H) 01/14/2023    See Psychiatric Specialty Exam and Suicide Risk Assessment completed by Attending Physician prior to discharge.  Discharge destination:  Home  Is patient on multiple antipsychotic therapies at discharge:  No   Has Patient had three or more failed trials of antipsychotic monotherapy by history:  No  Recommended Plan for Multiple Antipsychotic Therapies: NA   Allergies as of 10/11/2023       Reactions   Banana Hives, Shortness Of Breath, Itching        Medication List     STOP taking these medications    DULoxetine 30 MG capsule Commonly known as: CYMBALTA   nicotine 21 mg/24hr patch Commonly known as: NICODERM CQ - dosed in mg/24 hours       TAKE these medications      Indication  apixaban 5 MG Tabs tablet Commonly known as: ELIQUIS Take 1 tablet (5 mg total) by mouth 2 (two) times  daily.  Indication: Cerebrovascular Accident or Stroke   atorvastatin 80 MG tablet Commonly known as: LIPITOR Take 1 tablet (80 mg total) by mouth at bedtime. What changed: when to take this  Indication: High Amount of Fats in the Blood   hydrOXYzine 25 MG tablet Commonly known as: ATARAX Take 1 tablet (25 mg total) by mouth every 6 (six) hours as needed for anxiety.  Indication: Feeling Anxious   metoprolol succinate 25 MG 24 hr tablet Commonly known as: TOPROL-XL Take 1 tablet (25 mg total) by mouth daily. What changed: medication strength  Indication: High Blood Pressure   sertraline 50 MG tablet Commonly known as: ZOLOFT Take 1 tablet (50 mg total) by mouth daily.  Indication: Major Depressive Disorder   traZODone 50 MG tablet Commonly known as: DESYREL Take 1 tablet (50 mg total) by mouth at bedtime as needed for sleep.  Indication: Trouble Sleeping        Follow-up Information     [facility], [facility] Follow up.   Why: You have a follow up via phone on Monday, 10/20/23 at 11 AM.   You have an office visit for follow up on Tuesday, 10/28/23 at 11 AM. Contact information: [address] [facility] [place] [address] [phone]                 Follow-up recommendations:   # It is recommended to the patient to continue psychiatric medications as prescribed, after discharge from the hospital.   # It is recommended to the patient to follow up with your outpatient psychiatric provider and PCP. # It was discussed with the patient, the impact of alcohol, drugs, tobacco have been there overall psychiatric and medical wellbeing, and total abstinence from substance use was recommended. # Prescriptions provided or sent directly to preferred pharmacy at discharge. Patient agreeable to plan. Given the opportunity to ask questions. Appears to feel comfortable with discharge.  # In the event of worsening symptoms, the patient is instructed to call the  crisis hotline (988), 911 and or go to the nearest ED for appropriate evaluation and treatment of symptoms. To  follow-up with primary care provider for other medical issues, concerns and or health care needs # Patient was discharged home to his mother's home with a plan to follow up as noted above.    SignedSheilda Deputy, PA-C 10/11/2023, 10:08 AM

## 2023-10-11 NOTE — Progress Notes (Signed)
Patient is discharging at this time. Patient is A&Ox4. Stable. Patient denies SI,HI, and A/V/H with no plan/intent. Printed AVS reviewed with and given to patient along with medications and follow up appointments. Suicide safety plan complete with copy provided to patient. Original form in assigned binder. Patient verbalized all understanding. All valuables/belongings returned to patient. Patient is being transported by his wife. Patient denies any pain/discomfort. No s/s of current distress.

## 2023-10-11 NOTE — Progress Notes (Signed)
  [facility] Adult Case Management Discharge Plan :  Will you be returning to the same living situation after discharge:  No. Patient will be going to [address] [place] [address] At discharge, do you have transportation home?: Yes,  The patient stated that his wife or mom would pick him up.  Do you have the ability to pay for your medications: Yes,  The patient stated yes.   Release of information consent forms completed and in the chart;  Patient's signature needed at discharge.  Patient to Follow up at:  Follow-up Information     [facility] Follow up.   Why: You have a follow up via phone on Monday, 10/20/23 at 11 AM.   You have an office visit for follow up on Tuesday, 10/28/23 at 11 AM. Contact information: [address] [facility] [place] [address] [phone]                 Next level of care provider has access to [facility] Link:yes  Safety Planning and Suicide Prevention discussed: Yes,  Angela Henderson/mother [phone])     Has patient been referred to the Quitline?: Patient does not use tobacco/nicotine products  Patient has been referred for addiction treatment: Yes, the patient will follow up with an outpatient provider for substance use disorder. Psychiatrist/APP: appointment made  Santina Cull, LCSW 10/11/2023, 11:14 AM

## 2023-10-11 NOTE — Progress Notes (Signed)
   10/10/23 2000  Psych Admission Type (Psych Patients Only)  Admission Status Involuntary  Psychosocial Assessment  Patient Complaints Depression  Eye Contact Fair  Facial Expression Animated  Affect Appropriate to circumstance  Speech Logical/coherent  Interaction Assertive  Motor Activity Slow  Appearance/Hygiene Improved  Behavior Characteristics Cooperative;Appropriate to situation  Mood Pleasant  Thought Process  Coherency WDL  Content WDL  Delusions None reported or observed  Perception WDL  Hallucination None reported or observed  Judgment WDL  Confusion None  Danger to Self  Current suicidal ideation? Denies  Agreement Not to Harm Self Yes  Description of Agreement verbal  Danger to Others  Danger to Others None reported or observed   Patient alert and oriented x 4, denies SI/HI/AVH, interacting appropriately with peers and staff, 15 minutes safety checks maintained, will continue to monitor

## 2023-10-11 NOTE — [facility] Suicide Risk Assessment (Signed)
 Suicide Risk Assessment  Discharge Assessment    [facility] Discharge Suicide Risk Assessment   Principal Problem: MDD (major depressive disorder), recurrent episode, severe (HCC) Discharge Diagnoses: Principal Problem:   MDD (major depressive disorder), recurrent episode, severe (HCC)   Total Time spent with patient: 45 minutes  Musculoskeletal: Strength & Muscle Tone: within normal limits Gait & Station: normal Patient leans: N/A  Psychiatric Specialty Exam  Presentation  General Appearance:  Appropriate for Environment  Eye Contact: Good  Speech: Normal Rate; Clear and Coherent  Speech Volume: Normal  Handedness:No data recorded  Mood and Affect  Mood: Euthymic  Duration of Depression Symptoms: Greater than two weeks  Affect: Appropriate   Thought Process  Thought Processes: Coherent; Linear  Descriptions of Associations:Intact  Orientation:Full (Time, Place and Person)  Thought Content:WDL  History of Schizophrenia/Schizoaffective disorder:No  Duration of Psychotic Symptoms:No data recorded Hallucinations:Hallucinations: None  Ideas of Reference:None  Suicidal Thoughts:Suicidal Thoughts: No  Homicidal Thoughts:Homicidal Thoughts: No   Sensorium  Memory: Immediate Good; Recent Good; Remote Good  Judgment: Fair  Insight: Fair   Art therapist  Concentration: Good  Attention Span: Good  Recall: Good  Fund of Knowledge: Fair  Language: Fair   Psychomotor Activity  Psychomotor Activity: Psychomotor Activity: Normal   Assets  Assets: Communication Skills; Social Support; Physical Health; Housing; Transportation   Sleep  Sleep: Sleep: Good   Physical Exam: Physical Exam Vitals and nursing note reviewed.  HENT:     Head: Normocephalic and atraumatic.     Nose: Nose normal.     Mouth/Throat:     Mouth: Mucous membranes are moist.  Eyes:     Extraocular Movements: Extraocular movements intact.  Pulmonary:      Effort: Pulmonary effort is normal.  Musculoskeletal:        General: Normal range of motion.  Skin:    General: Skin is dry.  Neurological:     Mental Status: He is alert and oriented to person, place, and time. Mental status is at baseline.  Psychiatric:        Attention and Perception: Attention and perception normal.        Mood and Affect: Mood and affect normal.        Speech: Speech normal.        Behavior: Behavior normal. Behavior is cooperative.        Thought Content: Thought content normal.        Cognition and Memory: Cognition and memory normal.        Judgment: Judgment normal.    Review of Systems  All other systems reviewed and are negative.  Blood pressure 124/86, pulse 88, temperature 97.6 F (36.4 C), resp. rate 16, height 6\' 1"  (1.854 m), weight 99.8 kg, SpO2 100%. Body mass index is 29.03 kg/m.  Mental Status Per Nursing Assessment::   On Admission:  Suicidal ideation indicated by patient  Demographic Factors:  Male  Loss Factors: Loss of significant relationship and Financial problems/change in socioeconomic status  Historical Factors: Impulsivity  Risk Reduction Factors:   Responsible for children under 87 years of age, Sense of responsibility to family, Employed, Living with another person, especially a relative, and Positive social support  Continued Clinical Symptoms:  Previous Psychiatric Diagnoses and Treatments  Cognitive Features That Contribute To Risk:  None    Suicide Risk:  Minimal: No identifiable suicidal ideation.  Patients presenting with no risk factors but with morbid ruminations; may be classified as minimal risk based on the severity of  the depressive symptoms   Follow-up Information     [facility] Follow up.   Why: You have a follow up via phone on Monday, 10/20/23 at 11 AM.   You have an office visit for follow up on Tuesday, 10/28/23 at 11 AM. Contact information: [address] [facility]  [place] [address] [phone]                 Plan Of Care/Follow-up recommendations:  # It is recommended to the patient to continue psychiatric medications as prescribed, after discharge from the hospital.   # It is recommended to the patient to follow up with your outpatient psychiatric provider and PCP. # It was discussed with the patient, the impact of alcohol, drugs, tobacco have been there overall psychiatric and medical wellbeing, and total abstinence from substance use was recommended. # Prescriptions provided or sent directly to preferred pharmacy at discharge. Patient agreeable to plan. Given the opportunity to ask questions. Appears to feel comfortable with discharge.  # In the event of worsening symptoms, the patient is instructed to call the crisis hotline (988), 911 and or go to the nearest ED for appropriate evaluation and treatment of symptoms. To follow-up with primary care provider for other medical issues, concerns and or health care needs # Patient was discharged home to his mother's home with a plan to follow up as noted above.    Zori Benbrook, PA-C 10/11/2023, 10:06 AM

## 2023-10-18 IMAGING — MR MR HEAD W/O CM
10 of 11 series · 43 of 48 positions shown · non-contrast
Comparison: 09/15/2021

CLINICAL DATA: Stroke follow-up

EXAM:
MRI HEAD WITHOUT CONTRAST
TECHNIQUE: Multiplanar, multiecho pulse sequences of the brain and surrounding
structures were obtained without intravenous contrast.

[Series 5: DWI · axial · 3.0mm · 0.88mm/px · z∈[-107,+40]mm · 10 of 100 slices shown (1 of 4)]
[im 1/100]
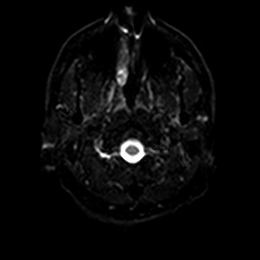
[im 12/100]
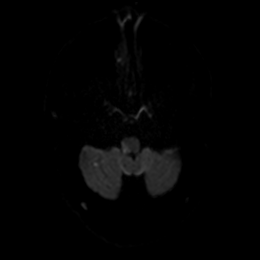
[im 23/100]
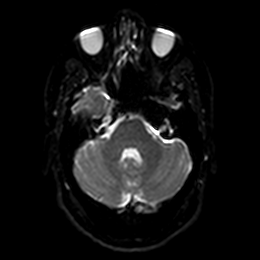
[im 34/100]
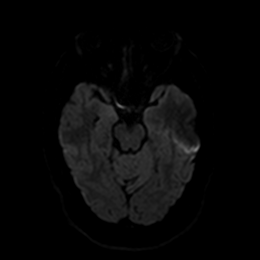
[im 45/100]
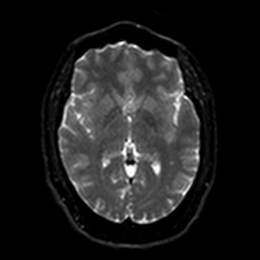
[im 56/100]
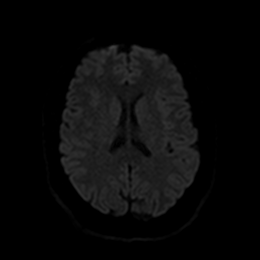
[im 67/100]
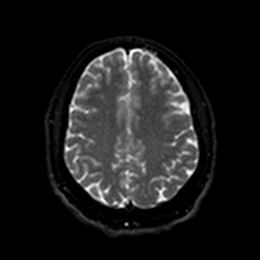
[im 78/100]
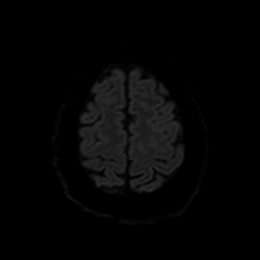
[im 89/100]
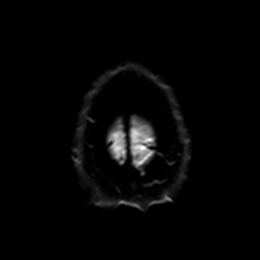
[im 100/100]
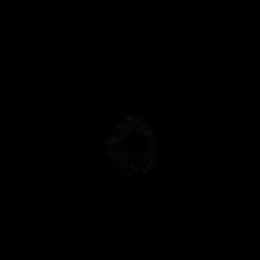

[Series 6: DWI · axial · 3.0mm · 0.88mm/px · z∈[-107,+40]mm · 5 of 50 slices shown (2 of 4)]
[im 1/50]
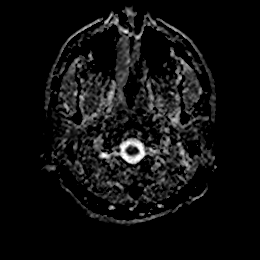
[im 13/50]
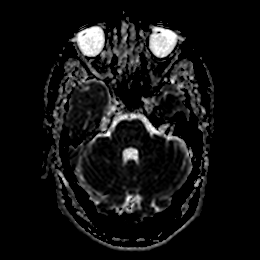
[im 25/50]
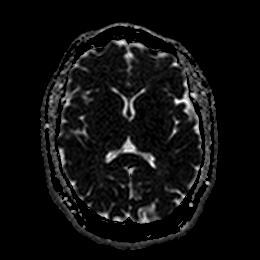
[im 37/50]
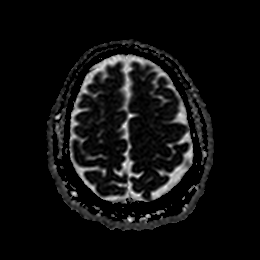
[im 50/50]
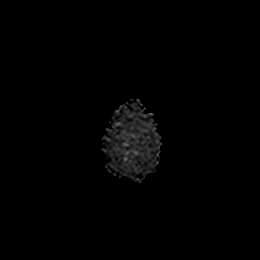

[Series 7: DWI · coronal · 4.0mm · 0.88mm/px · 6 of 64 slices shown (3 of 4)]
[im 1/64]
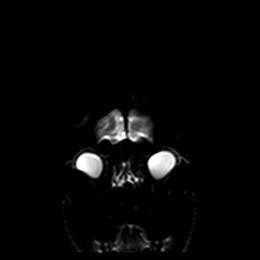
[im 13/64]
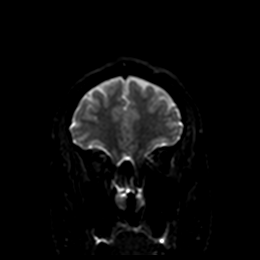
[im 26/64]
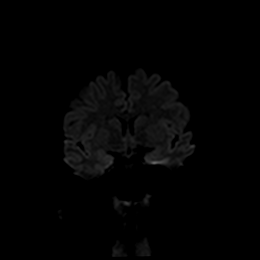
[im 38/64]
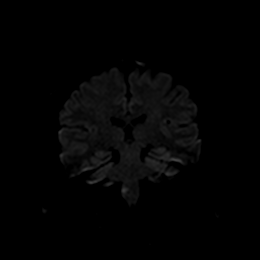
[im 51/64]
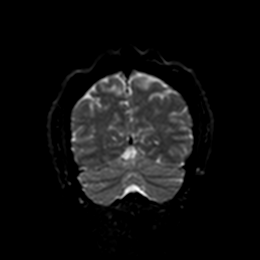
[im 64/64]
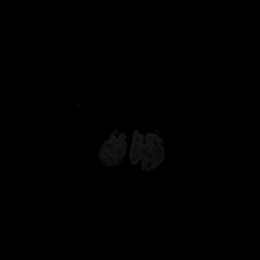

[Series 8: DWI · coronal · 4.0mm · 0.88mm/px · 3 of 32 slices shown (4 of 4)]
[im 1/32]
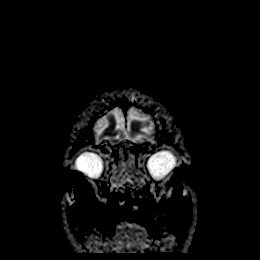
[im 16/32]
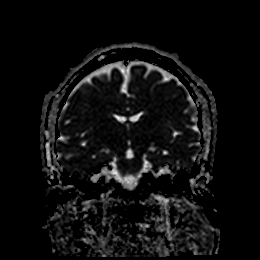
[im 32/32]
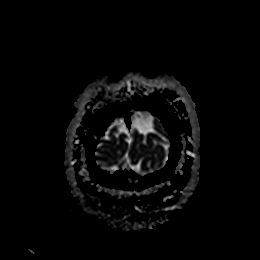

[Series 9: T1 · sagittal · 5.0mm · 0.75mm/px · 2 of 23 slices shown]
[im 1/23]
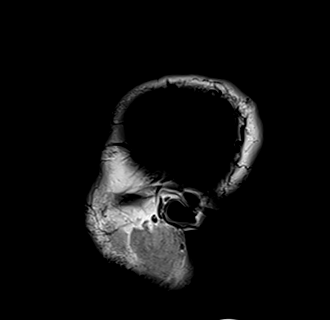
[im 23/23]
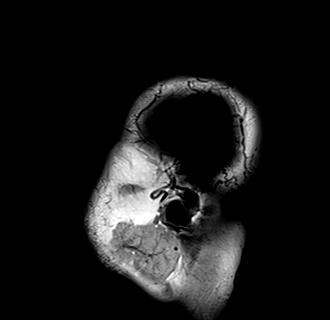

[Series 10: T2 · axial · 5.0mm · 0.72mm/px · z∈[-106,+38]mm · 2 of 25 slices shown (1 of 2)]
[im 1/25]
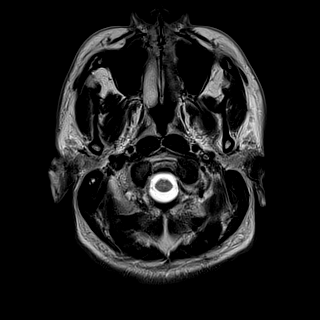
[im 25/25]
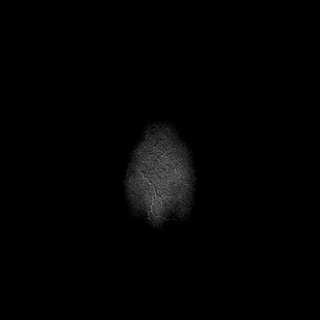

[Series 11: FLAIR · axial · 5.0mm · 0.45mm/px · z∈[-105,+39]mm · 2 of 25 slices shown]
[im 1/25]
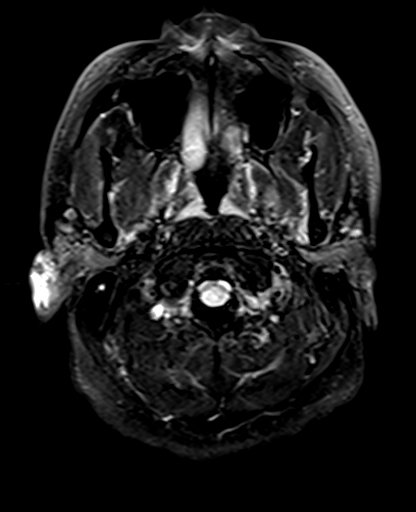
[im 25/25]
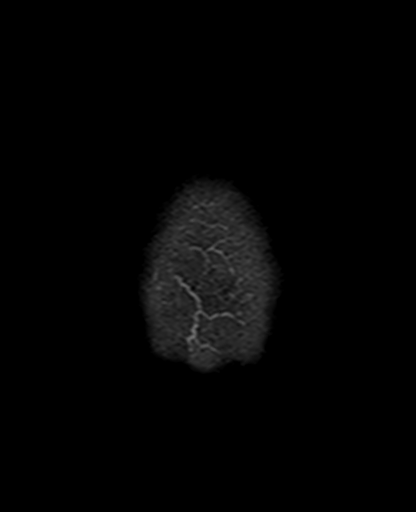

[Series 12: mag_images · axial · 3.0mm · 0.90mm/px · z∈[-122,+55]mm · 5 of 60 slices shown]
[im 1/60]
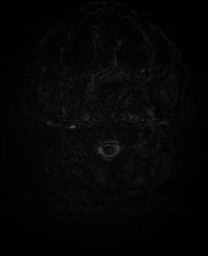
[im 15/60]
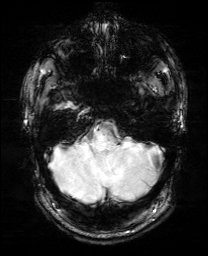
[im 30/60]
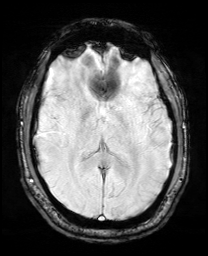
[im 45/60]
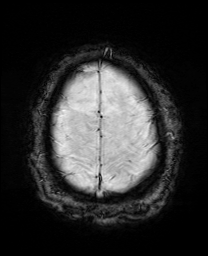
[im 60/60]
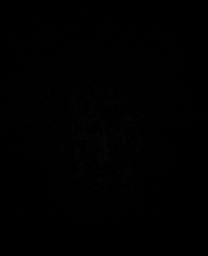

[Series 14: swi_images · axial · 3.0mm · 0.90mm/px · z∈[-122,+55]mm · 5 of 60 slices shown]
[im 1/60]
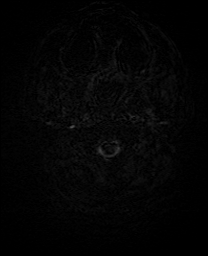
[im 15/60]
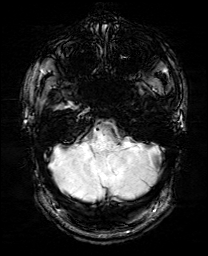
[im 30/60]
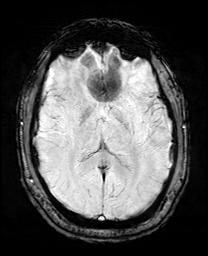
[im 45/60]
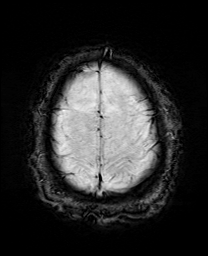
[im 60/60]
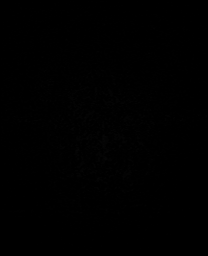

[Series 17: T2 · coronal · 5.0mm · 0.34mm/px · 3 of 29 slices shown (2 of 2)]
[im 1/29]
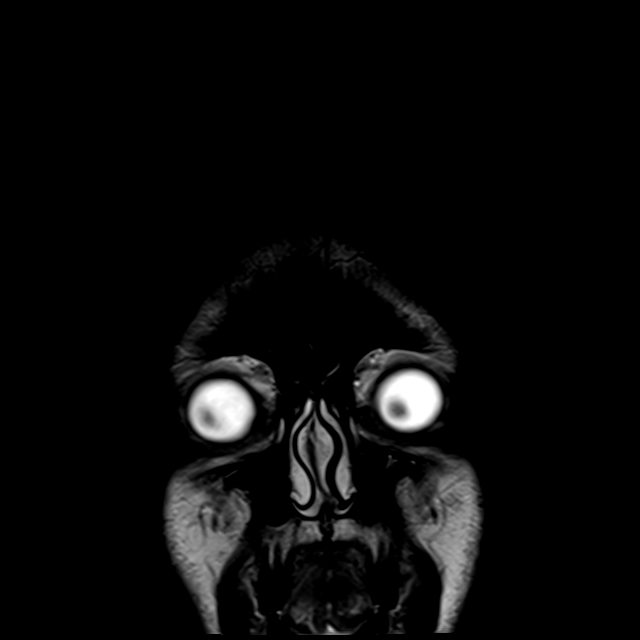
[im 15/29]
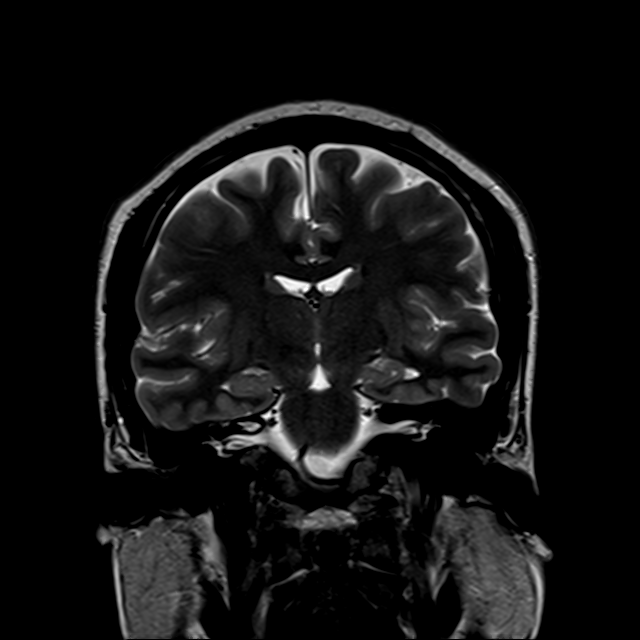
[im 29/29]
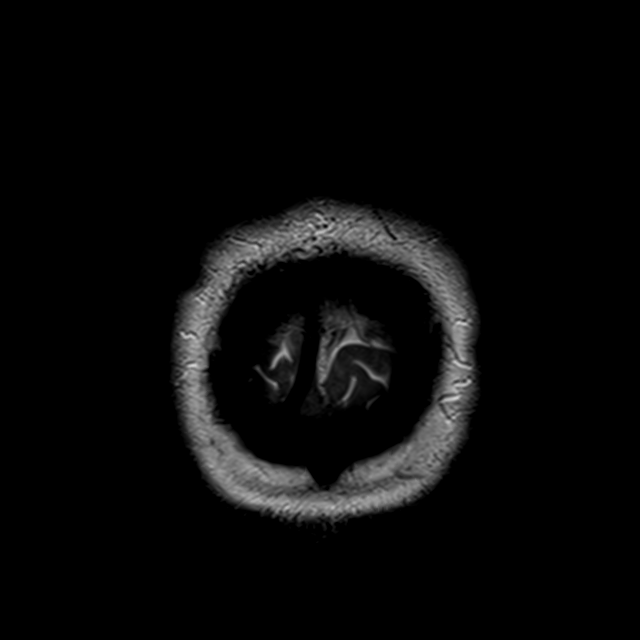

[43 of 48 positions shown; findings below may reference images not displayed]

FINDINGS: Brain: Unchanged multifocal right cerebellar early subacute
ischemia. Increased size of early subacute right medullary infarct.
No new site of ischemia. No acute or chronic hemorrhage. Normal
white matter signal, parenchymal volume and CSF spaces. The midline
structures are normal.

Vascular: Major flow voids are preserved.

Skull and upper cervical spine: Normal calvarium and skull base.
Visualized upper cervical spine and soft tissues are normal.

Sinuses/Orbits:No paranasal sinus fluid levels or advanced mucosal
thickening. No mastoid or middle ear effusion. Normal orbits.
IMPRESSION: 1. Increased size of early subacute right medullary infarct. No
hemorrhage or mass effect.
2. Unchanged multifocal right cerebellar early subacute ischemia.

## 2023-12-26 ENCOUNTER — Encounter (HOSPITAL_COMMUNITY): Payer: Self-pay | Admitting: Interventional Radiology

## 2024-01-15 ENCOUNTER — Other Ambulatory Visit: Payer: Self-pay

## 2024-01-15 ENCOUNTER — Encounter (HOSPITAL_COMMUNITY): Payer: Self-pay | Admitting: Emergency Medicine

## 2024-01-15 ENCOUNTER — Emergency Department (HOSPITAL_COMMUNITY)
Admission: EM | Admit: 2024-01-15 | Discharge: 2024-01-15 | Disposition: A | Discharge status: Home / Self Care | Attending: Emergency Medicine | Admitting: Emergency Medicine

## 2024-01-15 ENCOUNTER — Emergency Department (HOSPITAL_COMMUNITY)

## 2024-01-15 DIAGNOSIS — Z7901 Long term (current) use of anticoagulants: Secondary | ICD-10-CM | POA: Diagnosis not present

## 2024-01-15 DIAGNOSIS — R202 Paresthesia of skin: Secondary | ICD-10-CM | POA: Diagnosis present

## 2024-01-15 DIAGNOSIS — E876 Hypokalemia: Secondary | ICD-10-CM | POA: Diagnosis not present

## 2024-01-15 DIAGNOSIS — R200 Anesthesia of skin: Secondary | ICD-10-CM

## 2024-01-15 LAB — CBC WITH DIFFERENTIAL/PLATELET
Abs Immature Granulocytes: 0 K/uL (ref 0.00–0.07)
Basophils Absolute: 0 K/uL (ref 0.0–0.1)
Basophils Relative: 0 %
Eosinophils Absolute: 0.1 K/uL (ref 0.0–0.5)
Eosinophils Relative: 1 %
HCT: 49.1 % (ref 39.0–52.0)
Hemoglobin: 16.4 g/dL (ref 13.0–17.0)
Lymphocytes Relative: 59 %
Lymphs Abs: 6.1 K/uL — ABNORMAL HIGH (ref 0.7–4.0)
MCH: 27.9 pg (ref 26.0–34.0)
MCHC: 33.4 g/dL (ref 30.0–36.0)
MCV: 83.6 fL (ref 80.0–100.0)
Monocytes Absolute: 0.6 K/uL (ref 0.1–1.0)
Monocytes Relative: 6 %
Neutro Abs: 3.5 K/uL (ref 1.7–7.7)
Neutrophils Relative %: 34 %
Platelets: 212 K/uL (ref 150–400)
RBC: 5.87 MIL/uL — ABNORMAL HIGH (ref 4.22–5.81)
RDW: 13.5 % (ref 11.5–15.5)
WBC: 10.4 K/uL (ref 4.0–10.5)
nRBC: 0 % (ref 0.0–0.2)

## 2024-01-15 LAB — COMPREHENSIVE METABOLIC PANEL WITH GFR
ALT: 42 U/L (ref 0–44)
AST: 28 U/L (ref 15–41)
Albumin: 4.1 g/dL (ref 3.5–5.0)
Alkaline Phosphatase: 69 U/L (ref 38–126)
Anion gap: 10 (ref 5–15)
BUN: 12 mg/dL (ref 6–20)
CO2: 27 mmol/L (ref 22–32)
Calcium: 9.1 mg/dL (ref 8.9–10.3)
Chloride: 101 mmol/L (ref 98–111)
Creatinine, Ser: 0.97 mg/dL (ref 0.61–1.24)
GFR, Estimated: >60 mL/min (ref >60)
Glucose, Bld: 114 mg/dL — ABNORMAL HIGH (ref 70–99)
Potassium: 3.1 mmol/L — ABNORMAL LOW (ref 3.5–5.1)
Sodium: 138 mmol/L (ref 135–145)
Total Bilirubin: 1.2 mg/dL (ref 0.0–1.2)
Total Protein: 7.4 g/dL (ref 6.5–8.1)

## 2024-01-15 LAB — LIPID PANEL
Cholesterol: 217 mg/dL — ABNORMAL HIGH (ref 0–200)
HDL: 38 mg/dL — ABNORMAL LOW (ref >40)
LDL Cholesterol: 147 mg/dL — ABNORMAL HIGH (ref 0–99)
Total CHOL/HDL Ratio: 5.7 ratio
Triglycerides: 158 mg/dL — ABNORMAL HIGH (ref <150)
VLDL: 32 mg/dL (ref 0–40)

## 2024-01-15 LAB — VITAMIN B12: Vitamin B-12: 277 pg/mL (ref 180–914)

## 2024-01-15 LAB — HEMOGLOBIN A1C
Hgb A1c MFr Bld: 5 % (ref 4.8–5.6)
Mean Plasma Glucose: 96.8 mg/dL

## 2024-01-15 LAB — ETHANOL: Alcohol, Ethyl (B): <15 mg/dL (ref <15)

## 2024-01-15 LAB — TSH: TSH: 3.731 u[IU]/mL (ref 0.350–4.500)

## 2024-01-15 NOTE — Consult Note (Signed)
 [facility] Consult Services  Stat Consult  Patient Name:   David Shields, David Shields Date of Birth:   14-Jul-1990 Identification Number:   CSN - [id number] Date of Service:   01/15/2024 01:27:24  Diagnosis:       R20.8 - Other disturbances of skin sensation  Impression Recurrent right arm and bilateral foot numbness in the setting of similar paresthesias at the time of previous ischemic stroke in 2023. Clinical presentation is most consistent with recrudescence given milder symptoms than prior, though would recommend further evaluation for recurrent stroke given risk factors.  Recommendations: - MRI brain - Continue Eliquis  - High intensity statin - Check TSH, B12, HgbA1c, lipid panel, metabolic/infectious workup  If MRI brain is negative for acute ischemia and patient clinically stable, would recommend follow-up with his outpatient neurologist for possible EMG/nerve conduction studies for the bilateral foot numbness.   Recommendations: Our recommendations are outlined below.  Diagnostic Studies : MRI head without contrast  Laboratory Studies : Hemoglobin A1c TSH B12  Antithrombotic Medication : Statins for LDL goal less than 70  Anticoagulant Medication : Eliquis  5mg  bid  Nursing Recommendations : IV Fluids, avoid dextrose  containing fluids, Maintain euglycemia Neuro checks q4 hrs x 24 hrs and then per shift Head of bed 30 degrees Continue with Telemetry  DVT Prophylaxis : Choice of Primary Team  Disposition : Neurology will follow    ----------------------------------------------------------------------------------------------------    Metrics: Dispatch Time: 01/15/2024 01:26:33 Callback Response Time: 01/15/2024 01:28:37  Primary Provider Notified of Diagnostic Impression and Management Plan on: 01/15/2024 02:11:17   CT HEAD: I personally reviewed all the CT images that were available to me and it showed: no acute  pathology   Labs K 3.1. Otherwise CBC and CMP unremarkable EtOH <15   ----------------------------------------------------------------------------------------------------  Chief Complaint: right arm and bilateral foot numbness  History of Present Illness: Patient is a 33 year old Male. Patient is a 33 year old man presenting to the ED after waking up with bilateral foot and right lateral upper arm numbness at about 12:30am, after going to sleep in his usual state of health at 10pm. He denies neck pain, head/neck injury, gait difficulty, vertigo, visual changes, facial droop, speech/language problems, or weakness. Denies headache. Denies bladder dysfunction.  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 He also has atrial fibrillation and is on Eliquis  for this. He denies residual neurological deficits.  No recent illnesses. + sleep deprivation. No recent medication changes. He has been having intermittent foot numbness more recently, but typically these episodes last 30 minutes or less.   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  He is not currently taking a statin or metoprolol  as previously prescribed  Allergies:  Reviewed Description: bananas  Social History: Smoking: Former Alcohol Use: Yes Drug Use: No  Family History:  There is no family history of  premature cerebrovascular disease pertinent  to this consultation  ROS : 14 Points Review of Systems was performed and was negative except mentioned in HPI.  Past Surgical History: There Is No Surgical History Contributory To Today's Visit There Is Surgical History of:  left shoulder surgery   Examination: BP(156/100), Pulse(108), Blood Glucose(114) 1A: Level of Consciousness - Alert; keenly responsive + 0 1B: Ask Month and Age - Both Questions Right + 0 1C: Blink Eyes & Squeeze Hands - Performs Both Tasks + 0 2: Test Horizontal Extraocular Movements - Normal + 0 3: Test Visual Fields - No Visual Loss + 0 4: Test Facial Palsy (Use Grimace if Obtunded) - Normal symmetry + 0 5A: Test Left Arm Motor Drift - No Drift for 10 Seconds + 0 5B: Test Right Arm Motor Drift - No Drift for 10 Seconds + 0 6A: Test Left Leg Motor Drift - No Drift for 5 Seconds + 0 6B: Test Right Leg Motor Drift - No Drift for 5 Seconds + 0 7: Test Limb Ataxia (FNF/Heel-Shin) - No Ataxia + 0 8: Test Sensation - Normal; No sensory loss + 0 9: Test Language/Aphasia - Normal; No aphasia + 0 10: Test Dysarthria - Normal + 0 11: Test Extinction/Inattention - No abnormality + 0  NIHSS Score: 0  Spoke with : Dr. Geroldine    This consult was conducted in real time using interactive audio and Immunologist. Patient was informed of the technology being used for this visit and agreed to proceed. Patient located in hospital and provider located at home/office setting.  Patient is being evaluated for possible acute neurologic impairment and high probability of imminent or life - threatening deterioration.I spent total of 40 minutes providing care to this patient, including time for face to face visit via telemedicine, review of medical records, imaging studies and discussion of findings with providers, the patient and / or family.   Dr Edsel Merry   [facility] For Inpatient follow-up with [facility] physician please call [facility] at [phone]. As we  are not an outpatient service for any post hospital discharge needs please contact the hospital for assistance.  If you have any questions for the [facility] physicians or need to reconsult for clinical or diagnostic changes please contact us  via [facility] at [phone].

## 2024-01-15 NOTE — ED Notes (Signed)
 Pt to ct

## 2024-01-15 NOTE — ED Provider Notes (Signed)
 [place] [facility] Provider Note   CSN: [id number] Arrival date & time: 01/15/24  0037     Patient presents with: Numbness   David Shields is a 33 y.o. male.   Patient is a 33 year old male with past medical history of prior CVA related to vertebral artery dissection, atrial fibrillation on Eliquis , hyperlipidemia.  Patient presenting today with complaints of right arm and bilateral lower leg numbness.  He went to bed at approximately 10 PM, then woke up just prior to presentation with the symptoms.  He denies any visual disturbances.  No headache.  He denies any weakness.       Prior to Admission medications   Medication Sig Start Date End Date Taking? Authorizing Provider  apixaban  (ELIQUIS ) 5 MG TABS tablet Take 1 tablet (5 mg total) by mouth 2 (two) times daily. 01/14/23   Patel, Sona, MD  atorvastatin  (LIPITOR ) 80 MG tablet Take 1 tablet (80 mg total) by mouth at bedtime. 10/10/23   Tingling, Corean, PA-C  hydrOXYzine  (ATARAX ) 25 MG tablet Take 1 tablet (25 mg total) by mouth every 6 (six) hours as needed for anxiety. 10/10/23   Tingling, Corean, PA-C  metoprolol  succinate (TOPROL -XL) 25 MG 24 hr tablet Take 1 tablet (25 mg total) by mouth daily. 10/11/23   Tingling, Corean, PA-C  sertraline  (ZOLOFT ) 50 MG tablet Take 1 tablet (50 mg total) by mouth daily. 10/11/23   Tingling, Corean, PA-C  traZODone  (DESYREL ) 50 MG tablet Take 1 tablet (50 mg total) by mouth at bedtime as needed for sleep. 10/10/23   Tingling, Corean, PA-C    Allergies: Banana    Review of Systems  All other systems reviewed and are negative.   Updated Vital Signs BP (!) 156/100 (BP Location: Left Arm)   Pulse (!) 108   Temp 98.1 F (36.7 C) (Oral)   Resp 18   Ht 6' 1 (1.854 m)   Wt 99.8 kg   SpO2 97%   BMI 29.03 kg/m   Physical Exam Vitals and nursing note reviewed.  Constitutional:      General: He is not in acute distress.    Appearance: He  is well-developed. He is not diaphoretic.  HENT:     Head: Normocephalic and atraumatic.  Cardiovascular:     Rate and Rhythm: Normal rate and regular rhythm.     Heart sounds: No murmur heard.    No friction rub.  Pulmonary:     Effort: Pulmonary effort is normal. No respiratory distress.     Breath sounds: Normal breath sounds. No wheezing or rales.  Abdominal:     General: Bowel sounds are normal. There is no distension.     Palpations: Abdomen is soft.     Tenderness: There is no abdominal tenderness.  Musculoskeletal:        General: Normal range of motion.     Cervical back: Normal range of motion and neck supple.  Skin:    General: Skin is warm and dry.  Neurological:     General: No focal deficit present.     Mental Status: He is alert and oriented to person, place, and time. Mental status is at baseline.     Cranial Nerves: No cranial nerve deficit.     Sensory: No sensory deficit.     Motor: No weakness.     Coordination: Coordination normal.     (all labs ordered are listed, but only abnormal results are displayed) Labs Reviewed  CBC WITH DIFFERENTIAL/PLATELET  COMPREHENSIVE METABOLIC PANEL WITH GFR  ETHANOL    EKG: None  Radiology: No results found.   Procedures   Medications Ordered in the ED - No data to display                                  Medical Decision Making Amount and/or Complexity of Data Reviewed Labs: ordered. Radiology: ordered.   Patient is a 33 year old male with history of prior CVA as described in the HPI presenting with complaints of numbness to his right arm and both feet.  Symptoms started sometime after 10 PM this evening and woke him from sleep.  Patient arrives here with stable vital signs and is afebrile.  To my exam, he appears neurologically intact.  Strength is 5 out of 5 throughout both upper and lower extremities and cranial nerves are intact.  Laboratory studies obtained including CBC, metabolic panel, and serum  EtOH, all of which are unremarkable.  CT scan of the head shows no acute process.  Teleneurology consultation obtained who has recommended some additional laboratory studies which have been ordered as well as an MRI to further rule out CVA.  Disposition discussed with the patient who is unwilling to stay until morning for the MRI and [facility]  I tried to convince him to stay, however he declines.  I will order the MRI as an outpatient to expedite his care.     Final diagnoses:  None    ED Discharge Orders     None          Geroldine Berg, MD 01/15/24 [id number]

## 2024-01-15 NOTE — ED Triage Notes (Addendum)
 Pt here with c/o numbness to R arm and bilateral feet. LKN 2200. Pt states he has had a stroke in the past. Pt on Eliquis . States symptoms feel same as last time he had a stroke.

## 2024-01-15 NOTE — Discharge Instructions (Signed)
 MRI should call to arrange an MRI of your brain.  Continue medications as previously prescribed.  Follow-up with the [facility] in the next several days, and return to the ER if symptoms worsen or change.

## 2024-01-15 NOTE — ED Notes (Signed)
 EDP at bedside

## 2024-03-14 ENCOUNTER — Other Ambulatory Visit: Payer: Self-pay

## 2024-03-14 ENCOUNTER — Emergency Department (HOSPITAL_COMMUNITY)

## 2024-03-14 ENCOUNTER — Emergency Department (HOSPITAL_COMMUNITY)
Admission: EM | Admit: 2024-03-14 | Discharge: 2024-03-14 | Disposition: A | Discharge status: Home / Self Care | Attending: Emergency Medicine | Admitting: Emergency Medicine

## 2024-03-14 ENCOUNTER — Encounter (HOSPITAL_COMMUNITY): Payer: Self-pay | Admitting: Emergency Medicine

## 2024-03-14 DIAGNOSIS — Z8673 Personal history of transient ischemic attack (TIA), and cerebral infarction without residual deficits: Secondary | ICD-10-CM | POA: Insufficient documentation

## 2024-03-14 DIAGNOSIS — R0789 Other chest pain: Secondary | ICD-10-CM | POA: Insufficient documentation

## 2024-03-14 DIAGNOSIS — I4891 Unspecified atrial fibrillation: Secondary | ICD-10-CM | POA: Insufficient documentation

## 2024-03-14 DIAGNOSIS — R079 Chest pain, unspecified: Secondary | ICD-10-CM

## 2024-03-14 DIAGNOSIS — Z7901 Long term (current) use of anticoagulants: Secondary | ICD-10-CM | POA: Diagnosis not present

## 2024-03-14 LAB — BASIC METABOLIC PANEL WITH GFR
Anion gap: 10 (ref 5–15)
BUN: 12 mg/dL (ref 6–20)
CO2: 27 mmol/L (ref 22–32)
Calcium: 9 mg/dL (ref 8.9–10.3)
Chloride: 103 mmol/L (ref 98–111)
Creatinine, Ser: 0.95 mg/dL (ref 0.61–1.24)
GFR, Estimated: >60 mL/min (ref >60)
Glucose, Bld: 99 mg/dL (ref 70–99)
Potassium: 3.7 mmol/L (ref 3.5–5.1)
Sodium: 140 mmol/L (ref 135–145)

## 2024-03-14 LAB — TROPONIN I (HIGH SENSITIVITY)
Troponin I (High Sensitivity): 7 ng/L (ref <18)
Troponin I (High Sensitivity): 7 ng/L (ref <18)

## 2024-03-14 LAB — CBC
HCT: 49.1 % (ref 39.0–52.0)
Hemoglobin: 16.5 g/dL (ref 13.0–17.0)
MCH: 28.5 pg (ref 26.0–34.0)
MCHC: 33.6 g/dL (ref 30.0–36.0)
MCV: 84.8 fL (ref 80.0–100.0)
Platelets: 189 K/uL (ref 150–400)
RBC: 5.79 MIL/uL (ref 4.22–5.81)
RDW: 13.8 % (ref 11.5–15.5)
WBC: 6.2 K/uL (ref 4.0–10.5)
nRBC: 0 % (ref 0.0–0.2)

## 2024-03-14 NOTE — ED Provider Notes (Signed)
 [place] EMERGENCY DEPARTMENT AT [facility] Provider Note   CSN: [id number] Arrival date & time: 03/14/24  9153     Patient presents with: Chest Pain   David Shields is a 33 y.o. male.  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  He also has left ventricular hypertrophy.  He states hewoke up this morning with some chest tightness.  He states when it occurs it last for about 10 seconds and is a 5 out of 10 on the pain scale it does not radiate.  It is not associated with any sweating, dizziness, nausea or shortness of breath.  Not having any pain at this time but wanted to be evaluated.  He is compliant with all of his medicines including the Eliquis , no lower extremity swelling or pain, no numbness tingling or weakness, no headache or other complaints.  He occasionally uses tobacco occasionally drinks but no drugs    Chest Pain Associated symptoms: no abdominal pain, no back pain, no dizziness, no fatigue, no fever, no nausea, no numbness, no palpitations, no shortness of breath, no vomiting and no weakness        Prior to Admission medications   Medication Sig Start Date End Date Taking? Authorizing Provider  apixaban  (ELIQUIS ) 5 MG TABS tablet Take 1 tablet (5 mg total) by mouth 2 (two) times daily. 01/14/23   Patel, Sona, MD  atorvastatin  (LIPITOR ) 80 MG tablet Take 1 tablet (80 mg total) by mouth at bedtime. 10/10/23   Tingling, Corean, PA-C  hydrOXYzine  (ATARAX ) 25 MG tablet Take 1 tablet (25 mg total) by mouth every 6 (six) hours as needed for anxiety. 10/10/23   Tingling, Corean, PA-C  metoprolol  succinate (TOPROL -XL) 25 MG 24 hr tablet Take 1 tablet (25 mg total) by mouth daily. 10/11/23   Tingling, Corean, PA-C  sertraline  (ZOLOFT ) 50 MG tablet Take 1 tablet (50 mg total) by mouth daily. 10/11/23   Tingling, Corean, PA-C  traZODone  (DESYREL ) 50 MG tablet Take 1 tablet (50 mg total) by mouth  at bedtime as needed for sleep. 10/10/23   Tingling, Corean, PA-C    Allergies: Banana    Review of Systems  Constitutional:  Negative for fatigue and fever.  HENT:  Negative for congestion.   Respiratory:  Negative for shortness of breath.   Cardiovascular:  Positive for chest pain. Negative for palpitations and leg swelling.  Gastrointestinal:  Negative for abdominal pain, nausea and vomiting.  Musculoskeletal:  Negative for back pain.  Neurological:  Negative for dizziness, weakness and numbness.  All other systems reviewed and are negative.   Updated Vital Signs BP (!) 135/110   Pulse 90   Resp 19   SpO2 97%   Physical Exam Vitals and nursing note reviewed.  Constitutional:      General: He is not in acute distress.    Appearance: He is well-developed.  HENT:     Head: Normocephalic and atraumatic.  Eyes:     Conjunctiva/sclera: Conjunctivae normal.  Cardiovascular:     Rate and Rhythm: Normal rate and regular rhythm.     Heart sounds: No murmur heard. Pulmonary:     Effort: Pulmonary effort is normal. No respiratory distress.     Breath sounds: Normal breath sounds.  Abdominal:     Palpations: Abdomen is soft.     Tenderness: There is no abdominal tenderness.  Musculoskeletal:        General: No swelling.  Cervical back: Neck supple.  Skin:    General: Skin is warm and dry.     Capillary Refill: Capillary refill takes less than 2 seconds.  Neurological:     Mental Status: He is alert.  Psychiatric:        Mood and Affect: Mood normal.     (all labs ordered are listed, but only abnormal results are displayed) Labs Reviewed  BASIC METABOLIC PANEL WITH GFR  CBC  TROPONIN I (HIGH SENSITIVITY)  TROPONIN I (HIGH SENSITIVITY)    EKG: EKG Interpretation Date/Time:  Sunday March 14 2024 09:06:21 EDT Ventricular Rate:  88 PR Interval:  142 QRS Duration:  98 QT Interval:  374 QTC Calculation: 453 R Axis:   50  Text Interpretation: Sinus rhythm  No significant change since prior 7/24 Confirmed by Towana Sharper [phone]) on 03/14/2024 9:14:54 AM  Radiology: [facility] Chest Port 1 View Result Date: 03/14/2024 CLINICAL DATA:  33 year old male with chest pain upon waking. Lightheaded. EXAM: PORTABLE CHEST 1 VIEW COMPARISON:  Chest radiograph 01/18/2023 and earlier. FINDINGS: Portable AP semi upright view at 0931 hours. Similar low lung volumes. Allowing for portable technique the lungs are clear. No pneumothorax or pleural effusion. Normal cardiac size and mediastinal contours. Visualized tracheal air column is within normal limits. Negative visible bowel gas. No osseous abnormality identified. IMPRESSION: Negative portable chest. Electronically Signed   By: VEAR Hurst M.D.   On: 03/14/2024 09:55     Procedures   Medications Ordered in the ED - No data to display                                  Medical Decision Making This patient presents to the ED for concern of intermittent chest tightness, this involves an extensive number of treatment options, and is a complaint that carries with it a high risk of complications and morbidity.  The differential diagnosis includes ACS, pericarditis, pneumonia, dissection, PE, pneumothorax, GERD, other   Co morbidities that complicate the patient evaluation :   Afib on eliquis , LVH, CVA   Additional history obtained:  Additional history obtained from EMR External records from outside source obtained and reviewed including prior notes, labs, imaging   Lab Tests:  I Ordered, and personally interpreted labs.  The pertinent results include: None negative with delta of 0 CBC and BMP are normal   Imaging Studies ordered:  I ordered imaging studies including Chest Xray which shows no pulmonary edema or infiltrate, no pneumothorax I independently visualized and interpreted imaging within scope of identifying emergent findings  I agree with the radiologist interpretation   Cardiac Monitoring: /  EKG:  The patient was maintained on a cardiac monitor.  I personally viewed and interpreted the cardiac monitored which showed an underlying rhythm of: Sinus rhythm, EKG without ischemic changes     Problem List / ED Course / Critical interventions / Medication management  Having chest tightness intermittent this morning that was nonradiating, nonexertional.  He has history of vertebral artery dissection, hypertension, cholesterol, A-fib.  He has had no chest pain in the ER, has had a very reassuring workup.  Not worried about a PE as he is not having shortness of breath, pleurisy, no tachycardia tachypnea or hypoxia and he is on anticoagulation for his A-fib that he risk even lower.  He had no leg pain or swelling, I discussed with patient that he is low risk for Mace, but risk  is not 0 so he should come back to the ER if he has new or worsening symptoms and follow-up closely with cardiology.  Cardiology referral was placed.  Patient agreeable with plan of care and discharge.  I have reviewed the patients home medicines and have made adjustments as needed       Amount and/or Complexity of Data Reviewed Labs: ordered. Radiology: ordered.        Final diagnoses:  Chest pain, unspecified type    ED Discharge Orders          Ordered    Ambulatory referral to Cardiology       Comments: If you have not heard from the Cardiology office within the next 72 hours please call [phone].   03/14/24 1242               Suellen Sherran LABOR, PA-C 03/14/24 1245    Suzette Pac, MD 03/15/24 1141

## 2024-03-14 NOTE — Discharge Instructions (Addendum)
 Seen in the ER today for chest pain, your workup was overall very reassuring and there were no sign of heart attack today.  You do have some risk factors including high cholesterol, enlarged left ventricle that make it important for you to have close outpatient follow-up.  Come back to the ER if you have new or worsening symptoms.

## 2024-03-14 NOTE — ED Triage Notes (Signed)
 Pt c/o mid to L CP and tightness x this morning upon waking up. Rates pain 6/10. Denies shob, endorses a little bit of lightheadedness but not too bad
